# Patient Record
Sex: Female | Born: 1935 | Race: White | Hispanic: No | Smoking: Never smoker
Health system: Southern US, Community
[De-identification: ages and names within clinical notes are randomized; demographics above are authoritative.]

## PROBLEM LIST (undated history)

## (undated) DIAGNOSIS — K219 Gastro-esophageal reflux disease without esophagitis: Secondary | ICD-10-CM

## (undated) DIAGNOSIS — F329 Major depressive disorder, single episode, unspecified: Secondary | ICD-10-CM

## (undated) DIAGNOSIS — F32A Depression, unspecified: Secondary | ICD-10-CM

## (undated) DIAGNOSIS — Z809 Family history of malignant neoplasm, unspecified: Secondary | ICD-10-CM

## (undated) DIAGNOSIS — C541 Malignant neoplasm of endometrium: Secondary | ICD-10-CM

## (undated) DIAGNOSIS — B029 Zoster without complications: Secondary | ICD-10-CM

## (undated) DIAGNOSIS — Z1379 Encounter for other screening for genetic and chromosomal anomalies: Secondary | ICD-10-CM

## (undated) DIAGNOSIS — M1990 Unspecified osteoarthritis, unspecified site: Secondary | ICD-10-CM

## (undated) HISTORY — PX: BREAST EXCISIONAL BIOPSY: SUR124

## (undated) HISTORY — DX: Major depressive disorder, single episode, unspecified: F32.9

## (undated) HISTORY — DX: Depression, unspecified: F32.A

## (undated) HISTORY — PX: CERVICAL SPINE SURGERY: SHX589

## (undated) HISTORY — DX: Encounter for other screening for genetic and chromosomal anomalies: Z13.79

## (undated) HISTORY — DX: Unspecified osteoarthritis, unspecified site: M19.90

## (undated) HISTORY — PX: BREAST SURGERY: SHX581

## (undated) HISTORY — DX: Zoster without complications: B02.9

## (undated) HISTORY — DX: Gastro-esophageal reflux disease without esophagitis: K21.9

## (undated) HISTORY — DX: Family history of malignant neoplasm, unspecified: Z80.9

---

## 1998-01-06 ENCOUNTER — Encounter: Admission: RE | Admit: 1998-01-06 | Discharge: 1998-01-06 | Payer: Self-pay | Admitting: *Deleted

## 1999-04-30 ENCOUNTER — Other Ambulatory Visit: Admission: RE | Admit: 1999-04-30 | Discharge: 1999-04-30 | Payer: Self-pay | Admitting: Family Medicine

## 1999-05-15 ENCOUNTER — Encounter: Payer: Self-pay | Admitting: Family Medicine

## 1999-05-15 ENCOUNTER — Encounter: Admission: RE | Admit: 1999-05-15 | Discharge: 1999-05-15 | Payer: Self-pay | Admitting: Family Medicine

## 2000-08-26 ENCOUNTER — Encounter: Admission: RE | Admit: 2000-08-26 | Discharge: 2000-08-26 | Payer: Self-pay | Admitting: Family Medicine

## 2000-08-26 ENCOUNTER — Encounter: Payer: Self-pay | Admitting: Family Medicine

## 2000-10-19 ENCOUNTER — Other Ambulatory Visit: Admission: RE | Admit: 2000-10-19 | Discharge: 2000-10-19 | Payer: Self-pay | Admitting: Family Medicine

## 2001-09-26 ENCOUNTER — Encounter: Payer: Self-pay | Admitting: Family Medicine

## 2001-09-26 ENCOUNTER — Encounter: Admission: RE | Admit: 2001-09-26 | Discharge: 2001-09-26 | Payer: Self-pay | Admitting: Family Medicine

## 2001-10-23 ENCOUNTER — Other Ambulatory Visit: Admission: RE | Admit: 2001-10-23 | Discharge: 2001-10-23 | Payer: Self-pay | Admitting: Family Medicine

## 2002-10-22 ENCOUNTER — Encounter: Payer: Self-pay | Admitting: Family Medicine

## 2002-10-22 ENCOUNTER — Encounter: Admission: RE | Admit: 2002-10-22 | Discharge: 2002-10-22 | Payer: Self-pay | Admitting: Family Medicine

## 2002-12-19 ENCOUNTER — Encounter: Payer: Self-pay | Admitting: Family Medicine

## 2002-12-19 ENCOUNTER — Encounter: Admission: RE | Admit: 2002-12-19 | Discharge: 2002-12-19 | Payer: Self-pay | Admitting: Family Medicine

## 2003-04-27 HISTORY — PX: LAMINECTOMY: SHX219

## 2003-10-25 ENCOUNTER — Encounter: Admission: RE | Admit: 2003-10-25 | Discharge: 2003-10-25 | Payer: Self-pay | Admitting: Family Medicine

## 2004-03-17 ENCOUNTER — Inpatient Hospital Stay (HOSPITAL_COMMUNITY): Admission: RE | Admit: 2004-03-17 | Discharge: 2004-03-18 | Payer: Self-pay | Admitting: Specialist

## 2004-11-16 ENCOUNTER — Other Ambulatory Visit: Admission: RE | Admit: 2004-11-16 | Discharge: 2004-11-16 | Payer: Self-pay | Admitting: Family Medicine

## 2004-11-16 ENCOUNTER — Ambulatory Visit: Payer: Self-pay | Admitting: Family Medicine

## 2004-11-25 ENCOUNTER — Encounter: Admission: RE | Admit: 2004-11-25 | Discharge: 2004-11-25 | Payer: Self-pay | Admitting: Family Medicine

## 2004-11-26 ENCOUNTER — Ambulatory Visit: Payer: Self-pay | Admitting: Family Medicine

## 2005-12-15 ENCOUNTER — Encounter: Admission: RE | Admit: 2005-12-15 | Discharge: 2005-12-15 | Payer: Self-pay | Admitting: Family Medicine

## 2006-01-04 ENCOUNTER — Ambulatory Visit: Payer: Self-pay | Admitting: Family Medicine

## 2006-01-21 LAB — FECAL OCCULT BLOOD, GUAIAC: Fecal Occult Blood: NEGATIVE

## 2006-01-26 ENCOUNTER — Ambulatory Visit: Payer: Self-pay | Admitting: Family Medicine

## 2007-01-05 ENCOUNTER — Encounter: Admission: RE | Admit: 2007-01-05 | Discharge: 2007-01-05 | Payer: Self-pay | Admitting: Family Medicine

## 2007-01-09 ENCOUNTER — Encounter (INDEPENDENT_AMBULATORY_CARE_PROVIDER_SITE_OTHER): Payer: Self-pay | Admitting: *Deleted

## 2007-02-08 ENCOUNTER — Encounter: Payer: Self-pay | Admitting: Family Medicine

## 2007-02-08 DIAGNOSIS — Z8659 Personal history of other mental and behavioral disorders: Secondary | ICD-10-CM

## 2007-02-08 DIAGNOSIS — K589 Irritable bowel syndrome without diarrhea: Secondary | ICD-10-CM | POA: Insufficient documentation

## 2007-02-08 DIAGNOSIS — H9319 Tinnitus, unspecified ear: Secondary | ICD-10-CM | POA: Insufficient documentation

## 2007-02-08 DIAGNOSIS — K219 Gastro-esophageal reflux disease without esophagitis: Secondary | ICD-10-CM

## 2007-02-08 DIAGNOSIS — M1990 Unspecified osteoarthritis, unspecified site: Secondary | ICD-10-CM | POA: Insufficient documentation

## 2007-03-14 ENCOUNTER — Ambulatory Visit: Payer: Self-pay | Admitting: Family Medicine

## 2007-03-14 DIAGNOSIS — E782 Mixed hyperlipidemia: Secondary | ICD-10-CM

## 2007-03-14 DIAGNOSIS — N3941 Urge incontinence: Secondary | ICD-10-CM

## 2007-03-15 ENCOUNTER — Encounter (INDEPENDENT_AMBULATORY_CARE_PROVIDER_SITE_OTHER): Payer: Self-pay | Admitting: *Deleted

## 2007-03-15 LAB — CONVERTED CEMR LAB
ALT: 18 units/L (ref 0–35)
Albumin: 4 g/dL (ref 3.5–5.2)
Chloride: 101 meq/L (ref 96–112)
Creatinine, Ser: 0.9 mg/dL (ref 0.4–1.2)
Direct LDL: 208.9 mg/dL
GFR calc non Af Amer: 66 mL/min
HDL: 45.6 mg/dL (ref >39.0)
Sodium: 140 meq/L (ref 135–145)

## 2007-03-30 ENCOUNTER — Ambulatory Visit: Payer: Self-pay | Admitting: Internal Medicine

## 2007-04-13 ENCOUNTER — Encounter: Payer: Self-pay | Admitting: Family Medicine

## 2007-04-13 ENCOUNTER — Ambulatory Visit: Payer: Self-pay | Admitting: Internal Medicine

## 2007-05-01 ENCOUNTER — Ambulatory Visit: Payer: Self-pay | Admitting: Family Medicine

## 2007-05-02 LAB — CONVERTED CEMR LAB
AST: 37 units/L (ref 0–37)
Cholesterol: 191 mg/dL (ref 0–200)

## 2007-11-08 ENCOUNTER — Ambulatory Visit: Payer: Self-pay | Admitting: Family Medicine

## 2007-11-14 LAB — CONVERTED CEMR LAB
Cholesterol: 222 mg/dL (ref 0–200)
HDL: 45 mg/dL (ref >39.0)
Triglycerides: 94 mg/dL (ref 0–149)
VLDL: 19 mg/dL (ref 0–40)

## 2007-11-16 ENCOUNTER — Encounter: Payer: Self-pay | Admitting: Family Medicine

## 2007-11-16 ENCOUNTER — Telehealth (INDEPENDENT_AMBULATORY_CARE_PROVIDER_SITE_OTHER): Payer: Self-pay | Admitting: *Deleted

## 2007-12-26 ENCOUNTER — Ambulatory Visit: Payer: Self-pay | Admitting: Family Medicine

## 2007-12-27 LAB — CONVERTED CEMR LAB
AST: 21 units/L (ref 0–37)
HDL: 32.3 mg/dL — ABNORMAL LOW (ref >39.0)
Total CHOL/HDL Ratio: 5
VLDL: 22 mg/dL (ref 0–40)

## 2008-01-05 ENCOUNTER — Other Ambulatory Visit: Admission: RE | Admit: 2008-01-05 | Discharge: 2008-01-05 | Payer: Self-pay | Admitting: Family Medicine

## 2008-01-05 ENCOUNTER — Encounter: Payer: Self-pay | Admitting: Family Medicine

## 2008-01-05 ENCOUNTER — Ambulatory Visit: Payer: Self-pay | Admitting: Family Medicine

## 2008-01-09 ENCOUNTER — Encounter: Payer: Self-pay | Admitting: Family Medicine

## 2008-01-23 ENCOUNTER — Encounter: Admission: RE | Admit: 2008-01-23 | Discharge: 2008-01-23 | Payer: Self-pay | Admitting: Family Medicine

## 2008-04-10 ENCOUNTER — Telehealth: Payer: Self-pay | Admitting: Family Medicine

## 2008-11-28 ENCOUNTER — Ambulatory Visit: Payer: Self-pay | Admitting: Family Medicine

## 2008-11-29 ENCOUNTER — Encounter: Admission: RE | Admit: 2008-11-29 | Discharge: 2008-11-29 | Payer: Self-pay | Admitting: Family Medicine

## 2008-11-29 ENCOUNTER — Encounter: Payer: Self-pay | Admitting: Family Medicine

## 2008-12-02 ENCOUNTER — Encounter: Payer: Self-pay | Admitting: Family Medicine

## 2008-12-02 LAB — CONVERTED CEMR LAB
ALT: 15 units/L (ref 0–35)
BUN: 15 mg/dL (ref 6–23)
Basophils Absolute: 0.1 10*3/uL (ref 0.0–0.1)
Chloride: 105 meq/L (ref 96–112)
Direct LDL: 176.1 mg/dL
Eosinophils Relative: 7 % — ABNORMAL HIGH (ref 0.0–5.0)
Glucose, Bld: 96 mg/dL (ref 70–99)
MCV: 92.4 fL (ref 78.0–100.0)
Monocytes Absolute: 0.6 10*3/uL (ref 0.1–1.0)
Neutrophils Relative %: 51.3 % (ref 43.0–77.0)
Phosphorus: 3.8 mg/dL (ref 2.3–4.6)
Platelets: 197 10*3/uL (ref 150.0–400.0)
Potassium: 4.5 meq/L (ref 3.5–5.1)
RDW: 12.5 % (ref 11.5–14.6)
TSH: 2.88 microintl units/mL (ref 0.35–5.50)
Total CHOL/HDL Ratio: 6
VLDL: 15.6 mg/dL (ref 0.0–40.0)
WBC: 6.5 10*3/uL (ref 4.5–10.5)

## 2009-01-14 ENCOUNTER — Ambulatory Visit: Payer: Self-pay | Admitting: Family Medicine

## 2009-01-15 LAB — CONVERTED CEMR LAB
ALT: 17 units/L (ref 0–35)
AST: 23 units/L (ref 0–37)
Cholesterol: 174 mg/dL (ref 0–200)
VLDL: 19.2 mg/dL (ref 0.0–40.0)

## 2009-01-23 ENCOUNTER — Encounter: Admission: RE | Admit: 2009-01-23 | Discharge: 2009-01-23 | Payer: Self-pay | Admitting: Family Medicine

## 2009-01-24 ENCOUNTER — Encounter (INDEPENDENT_AMBULATORY_CARE_PROVIDER_SITE_OTHER): Payer: Self-pay | Admitting: *Deleted

## 2009-04-28 ENCOUNTER — Telehealth: Payer: Self-pay | Admitting: Family Medicine

## 2009-05-21 ENCOUNTER — Ambulatory Visit: Payer: Self-pay | Admitting: Family Medicine

## 2009-07-21 ENCOUNTER — Ambulatory Visit: Payer: Self-pay | Admitting: Family Medicine

## 2009-07-22 ENCOUNTER — Telehealth: Payer: Self-pay | Admitting: Family Medicine

## 2009-07-22 LAB — CONVERTED CEMR LAB
Cholesterol: 215 mg/dL — ABNORMAL HIGH (ref 0–200)
HDL: 57.7 mg/dL (ref >39.00)
Triglycerides: 97 mg/dL (ref 0.0–149.0)
VLDL: 19.4 mg/dL (ref 0.0–40.0)

## 2009-09-23 ENCOUNTER — Ambulatory Visit: Payer: Self-pay | Admitting: Family Medicine

## 2009-09-24 LAB — CONVERTED CEMR LAB
ALT: 15 units/L (ref 0–35)
HDL: 51.4 mg/dL (ref >39.00)
LDL Cholesterol: 80 mg/dL (ref 0–99)
Total CHOL/HDL Ratio: 3
Triglycerides: 72 mg/dL (ref 0.0–149.0)
VLDL: 14.4 mg/dL (ref 0.0–40.0)

## 2009-10-24 DIAGNOSIS — B029 Zoster without complications: Secondary | ICD-10-CM

## 2009-10-24 HISTORY — DX: Zoster without complications: B02.9

## 2009-12-09 ENCOUNTER — Telehealth (INDEPENDENT_AMBULATORY_CARE_PROVIDER_SITE_OTHER): Payer: Self-pay | Admitting: *Deleted

## 2009-12-10 ENCOUNTER — Ambulatory Visit: Payer: Self-pay | Admitting: Family Medicine

## 2009-12-11 LAB — CONVERTED CEMR LAB
ALT: 11 units/L (ref 0–35)
AST: 18 units/L (ref 0–37)
Basophils Absolute: 0.1 10*3/uL (ref 0.0–0.1)
Basophils Relative: 1 % (ref 0.0–3.0)
CO2: 31 meq/L (ref 19–32)
Calcium: 9.4 mg/dL (ref 8.4–10.5)
Creatinine, Ser: 0.8 mg/dL (ref 0.4–1.2)
Eosinophils Absolute: 0.5 10*3/uL (ref 0.0–0.7)
GFR calc non Af Amer: 72.33 mL/min (ref >60)
Hemoglobin: 14 g/dL (ref 12.0–15.0)
Lymphocytes Relative: 36.9 % (ref 12.0–46.0)
MCHC: 33.6 g/dL (ref 30.0–36.0)
Monocytes Relative: 8.3 % (ref 3.0–12.0)
Neutro Abs: 2.9 10*3/uL (ref 1.4–7.7)
Neutrophils Relative %: 45.7 % (ref 43.0–77.0)
Potassium: 4.5 meq/L (ref 3.5–5.1)
RBC: 4.51 M/uL (ref 3.87–5.11)
RDW: 13.8 % (ref 11.5–14.6)
Sodium: 144 meq/L (ref 135–145)
Total CHOL/HDL Ratio: 5

## 2009-12-15 ENCOUNTER — Ambulatory Visit: Payer: Self-pay | Admitting: Family Medicine

## 2009-12-15 DIAGNOSIS — B029 Zoster without complications: Secondary | ICD-10-CM | POA: Insufficient documentation

## 2010-01-26 ENCOUNTER — Encounter: Admission: RE | Admit: 2010-01-26 | Discharge: 2010-01-26 | Payer: Self-pay | Admitting: Family Medicine

## 2010-01-26 LAB — HM MAMMOGRAPHY: HM Mammogram: NORMAL

## 2010-01-29 ENCOUNTER — Encounter: Payer: Self-pay | Admitting: Family Medicine

## 2010-03-17 ENCOUNTER — Ambulatory Visit: Payer: Self-pay | Admitting: Family Medicine

## 2010-03-17 LAB — CONVERTED CEMR LAB
Cholesterol: 196 mg/dL (ref 0–200)
HDL: 57.6 mg/dL (ref >39.00)
Triglycerides: 105 mg/dL (ref 0.0–149.0)

## 2010-05-28 NOTE — Progress Notes (Signed)
Summary: regarding zocor  Phone Note Call from Patient   Caller: Patient Call For: Judith Part MD Summary of Call: Advised pt of lab results, she said she has not been taking zocor regularly, says she takes it 2-3 times a week.  She said she would start back on it and will take it regularly. Initial call taken by: Lowella Petties CMA,  July 22, 2009 1:03 PM  Follow-up for Phone Call        ok- get on it regularly sched fasting lab 2 mo lipid/ast/alt 272 Follow-up by: Judith Part MD,  July 22, 2009 1:30 PM  Additional Follow-up for Phone Call Additional follow up Details #1::        Left message for patient to call back. Lewanda Rife LPN  July 22, 2009 2:44 PM   Patient notified as instructed by telephone. Fasting lab appointment scheduled as instructed 09/23/09 at 8:30am.[facility] LPN  July 22, 2009 5:10 PM

## 2010-05-28 NOTE — Progress Notes (Signed)
Summary: pt wants 90 day supply of paxil  Phone Note Refill Request Call back at Home Phone [phone] Message from:  Patient  Refills Requested: Medication #1:  PAXIL 20 MG TABS 1 by mouth once daily Pt is requesting that a 90 day supply be sent to [facility] [address], script was written for a 30 day supply.  Initial call taken by: Lowella Petties CMA,  April 28, 2009 3:18 PM  Follow-up for Phone Call        px written on EMR for call in  Follow-up by: Judith Part MD,  April 28, 2009 4:40 PM  Additional Follow-up for Phone Call Additional follow up Details #1::        Sent electronically. Additional Follow-up by: Delilah Shan CMA (AAMA),  April 29, 2009 9:16 AM    Prescriptions: PAXIL 20 MG TABS (PAROXETINE HCL) 1 by mouth once daily  #90 x 3   Entered by:   Delilah Shan CMA (AAMA)   Authorized by:   Judith Part MD   Signed by:   Delilah Shan CMA Duncan Dull) on 04/29/2009   Method used:   Electronically to        [facility] [phone]* (retail)       [address]       [place], [place]  [address]       Ph: [phone]       Fax: [phone]   RxID:   [id number] PAXIL 20 MG TABS (PAROXETINE HCL) 1 by mouth once daily  #90 x 3   Entered and Authorized by:   Judith Part MD   Signed by:   Judith Part MD on 04/28/2009   Method used:   Telephoned to ...         RxID:   [id number]
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
## 2010-05-28 NOTE — Letter (Signed)
Summary: Results Follow up Letter  [place] [facility]  [address] [place], [place] [address]   Phone: [phone]  Fax: [phone]    01/29/2010 MRN: [id number]    [facility] [address] [place], [place]  [address]    Dear Ms. Biebel,  The following are the results of your recent test(s):  Test         Result    Pap Smear:        Normal _____  Not Normal _____ Comments: ______________________________________________________ Cholesterol: LDL(Bad cholesterol):         Your goal is less than:         HDL (Good cholesterol):       Your goal is more than: Comments:  ______________________________________________________ Mammogram:        Normal __X___  Not Normal _____ Comments:Repeat in one year.   ___________________________________________________________________ Hemoccult:        Normal _____  Not normal _______ Comments:    _____________________________________________________________________ Other Tests:    We routinely do not discuss normal results over the telephone.  If you desire a copy of the results, or you have any questions about this information we can discuss them at your next office visit.   Sincerely,    Idamae Schuller Tamarick Kovalcik,MD  MT/ri

## 2010-05-28 NOTE — Progress Notes (Signed)
----   Converted from flag ---- ---- 12/09/2009 8:06 AM, Colon Flattery Tower MD wrote: please check lipid/ renal /ast/alt/ cbc with diff for 272 and GERD  ---- 12/09/2009 7:35 AM, Liane Comber CMA (AAMA) wrote: [facility]! This pt is scheduled for cpx labs tomorrow, which labs to draw and dx codes to use? Thanks Tasha ------------------------------

## 2010-09-08 ENCOUNTER — Other Ambulatory Visit: Payer: Self-pay | Admitting: Family Medicine

## 2010-09-08 DIAGNOSIS — E7800 Pure hypercholesterolemia, unspecified: Secondary | ICD-10-CM

## 2010-09-09 ENCOUNTER — Other Ambulatory Visit (INDEPENDENT_AMBULATORY_CARE_PROVIDER_SITE_OTHER): Payer: Medicare Other | Admitting: Family Medicine

## 2010-09-09 DIAGNOSIS — E7800 Pure hypercholesterolemia, unspecified: Secondary | ICD-10-CM

## 2010-09-09 LAB — LIPID PANEL
Cholesterol: 259 mg/dL — ABNORMAL HIGH (ref 0–200)
Total CHOL/HDL Ratio: 5
Triglycerides: 117 mg/dL (ref 0.0–149.0)
VLDL: 23.4 mg/dL (ref 0.0–40.0)

## 2010-09-09 LAB — AST: AST: 20 U/L (ref 0–37)

## 2010-09-11 NOTE — Op Note (Signed)
NAMECHASTELYN, ATHENS              ACCOUNT NO.:  [id number]   MEDICAL RECORD NO.:  [id number]          PATIENT TYPE:  INP   LOCATION:  2550                         FACILITY:  [facility]   PHYSICIAN:  Kerrin Champagne, M.D.   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   OPERATION PERFORMED:  Left L4 hemilaminectomy with decompression of both the  left L4 and L5 nerve roots.   SURGEON:  Kerrin Champagne, M.D.   ASSISTANT:  Wende Neighbors, P.A.   ANESTHESIA:  GOT.   ANESTHESIOLOGIST:  Kaylyn Layer. Michelle Piper, M.D.   Intubation using an oral obturator.   ESTIMATED BLOOD LOSS:  50 mL.   DRAINS:  None.   INDICATIONS FOR PROCEDURE:  The patient is a 77 year old female who has been  experiencing low back pain for years.  Over the last half year, has  developed increasing discomfort and left arm radiation down to anterior calf  to her left great toe.  She was able to walk.  Discomfort was worsened with  sweeping, walking and vacuuming.  She has a history of left-sided protruded  disk by MRI.  She was found to have eccentric disk bulge with associated  arthrosis changes causing left L4-5 lateral recess stenosis affecting the L5  nerve root and entrapment of the L4 nerve roots within the neural foramen  secondary to the disk degeneration and arthrosis.  She was brought to the  operating room to undergo a left-sided L4-5 hemilaminectomy with lateral  recess decompression and foraminal decompression to be carried out over the  left-sided L4 and L5 nerve roots.   INTRAOPERATIVE FINDINGS:  The patient was found to have hypertrophic  ligamentum flavum associated with the left  L4-5 facet impressing on the left  side of the thecal sac compressing the left L5 nerve root within the lateral  recess, foraminal entrapment of the L5 nerve root also found to be present  as well as L4 nerve root compression secondary to the hypertrophic flavum  involving the reflected portion of the ligamentum flavum and the superior  articular process of L5.  The patient also had decompression of the lateral  recess of the upper segment L3-4.   DESCRIPTION OF PROCEDURE:  After adequate general anesthesia the patient  transferred to the [facility] spine table, knee chest position, Andrews frame,  standard preoperative antibiotics, standard prep with DuraPrep solution,  draped in the usual manner.  A spinal needle was placed.  Intraoperative  lateral radiograph demonstrated the needles above and below the expected L4  spinous process.  Incision made over this area following infiltration with  Marcaine 0.5% with 1:200,000 epinephrine about 5 mL to 10 mL.  An iodine Vi-  drape was used.  Incision was carried sharply through skin and subcutaneous  layers down to the lumbodorsal fascia.  This was incised on both sides of  the spinous process of L3 and L4.  Intraoperative lateral radiograph  obtained with a clamp over the spinous process of L3 and L4 demonstrating  these to be the said mentioned spinous process.  Skin marked in these areas  and a Cobb used to elevate the paralumbar muscles on the left side at the  expected L3 and L4 levels down to the L4-5 interlaminar region.  McCullough  retractor was inserted after removal of a small portion of the left lateral  aspect of the superficial portion of the spinous process to allow for  further exposure of the posterior elements here.  After placing the Boss-  McCullough retractor, visualization was quite nice within the left  interlaminar region of L4-5 extending out to L3-4.  A high speed bur was  then used to carefully thin the left side of the  lamina of L4, removing  about 20% of the medial aspect of the inferior articular process of L4 and  the process.  A half inch osteotome was used to further osteotomize the  deeper portions of the inferior articular process of L4 to allow for further  entry into the interlaminar region which had closed down due to degenerative  disk changes. A 3 mm Kerrison was then able to be introduced.  This was then  used to perform the left-sided hemilaminectomy removing the bone and  preserving the approximately 3 or 4 mm of the pars regions on the left side  at L4.  This was then carried up to the superior margin of L4 in the lamina  on the left side and this was also then removed and the ligamentum flavum at  the L3-4 level was then excised and decompression carried out over the left  L3-4 lateral recess decompressing the 4 nerve root at its entry point into  the neural foramen.  The operating room microscope was then draped and  brought into the field.  Prior to that, loupe magnification and head lamp  was used for the initial entry into the spinal canal.  Under the operating  room microscope then, with excellent visualization then the ligamentum  flavum was excised along the left side at the L4-5 level and partial medial  facetectomy carried out over the superior tip of the process of L5  decompressing the lateral recess out to the level of the medial aspect of  the pedicle of L5 performing foraminotomy over the left L5 nerve root using  3 mm and 2 mm Kerrisons.  With resection of this, the canal was examined.  A  hockey stick nerve probe was able to be passed out the L5 neural foramen  demonstrating decompression of the 5 nerve root.  It was able to be passed  out beneath or ventral to the facet at the L4-5 level demonstrating  decompression of the left L4 nerve root beneath the L4-5 facet on the left  side.  Continuing up the left lateral recess at L4, appeared to be well decompressed as well.   Irrigation was performed.  Bone wax was applied to  the bleeding cancellous bone surface along the area of the hemilaminectomy.  Excess bone wax was removed.  Irrigation was performed.  A small portion of  Gelfoam was then placed over the left side of the hemilaminectomy area.  Soft tissue was allowed to fall back into place.  The incision was then  closed by approximating  the lumbodorsal fascia in the midline with  interrupted #1 Vicryl sutures, deep subcutaneous layers with interrupted #1-  0 Vicryl sutures, more superficial layers with interrupted 2-0 Vicryl  sutures and the skin closed with a running subcutaneous stitch of 4-0  Vicryl.  Tincture of benzoin and Steri-Strips applied.  4 x 4s, ABD pad  fixed to the skin with HypaFix tape.  The patient then returned to a supine  position, reactivated extubated and returned to the recovery room in  satisfactory.  All instrument and sponge counts were correct.      Jame   JEN/MEDQ  D:  03/17/2004  T:  03/17/2004  Job:  [id number]

## 2010-09-21 ENCOUNTER — Other Ambulatory Visit: Payer: Self-pay | Admitting: Family Medicine

## 2010-09-23 NOTE — Telephone Encounter (Signed)
Medication already sent electronically.

## 2010-09-23 NOTE — Telephone Encounter (Signed)
Pt needs to call for appt. 

## 2010-12-31 ENCOUNTER — Other Ambulatory Visit: Payer: Self-pay | Admitting: Family Medicine

## 2010-12-31 NOTE — Telephone Encounter (Signed)
Will refill x 1, but pt needs f/u appt with PCP prior to more refills.

## 2010-12-31 NOTE — Telephone Encounter (Signed)
[facility] on [address] electronically request refill on Paroxetine 20 mg. Pt last refill of this med was 09/21/10 #90 with 0 refills. Pt had CPX on 12/15/09 with Dr Milinda Antis and pt does not have any future appts scheduled. Please advise.

## 2011-01-20 ENCOUNTER — Other Ambulatory Visit: Payer: Self-pay | Admitting: Family Medicine

## 2011-01-20 DIAGNOSIS — Z1231 Encounter for screening mammogram for malignant neoplasm of breast: Secondary | ICD-10-CM

## 2011-01-28 ENCOUNTER — Ambulatory Visit
Admission: RE | Admit: 2011-01-28 | Discharge: 2011-01-28 | Disposition: A | Discharge status: Home / Self Care | Payer: Medicare Other | Source: Ambulatory Visit | Attending: Family Medicine | Admitting: Family Medicine

## 2011-01-28 DIAGNOSIS — Z1231 Encounter for screening mammogram for malignant neoplasm of breast: Secondary | ICD-10-CM

## 2011-02-01 ENCOUNTER — Encounter: Payer: Self-pay | Admitting: *Deleted

## 2011-05-07 ENCOUNTER — Other Ambulatory Visit: Payer: Self-pay | Admitting: Family Medicine

## 2011-05-07 NOTE — Telephone Encounter (Signed)
If she has not been seen in over a year - please make appt and give enough refils until that visit thanks

## 2011-05-07 NOTE — Telephone Encounter (Signed)
OK to refill? Last OV 01/26/10

## 2011-05-10 ENCOUNTER — Other Ambulatory Visit: Payer: Self-pay | Admitting: *Deleted

## 2011-05-10 MED ORDER — PAROXETINE HCL 20 MG PO TABS: 20.0000 mg | ORAL_TABLET | ORAL | Status: DC

## 2011-05-10 NOTE — Telephone Encounter (Signed)
Will refil once -- please schedule follow up  No further refils without f/u after that

## 2011-05-10 NOTE — Telephone Encounter (Signed)
OK to refill? Last OV 01/26/10 

## 2011-05-11 NOTE — Telephone Encounter (Signed)
Spoke with pt and scheduled a fu appt

## 2011-05-11 NOTE — Telephone Encounter (Signed)
Message left for patient to call and schedule an appt. Meds not called in yet. Awaiting return call from patient.

## 2011-05-21 ENCOUNTER — Encounter: Payer: Self-pay | Admitting: Family Medicine

## 2011-05-21 ENCOUNTER — Ambulatory Visit (INDEPENDENT_AMBULATORY_CARE_PROVIDER_SITE_OTHER): Payer: Medicare Other | Admitting: Family Medicine

## 2011-05-21 VITALS — BP 140/60 | HR 72 | Temp 98.1°F | Ht 65.0 in | Wt 187.5 lb

## 2011-05-21 DIAGNOSIS — F329 Major depressive disorder, single episode, unspecified: Secondary | ICD-10-CM

## 2011-05-21 DIAGNOSIS — E669 Obesity, unspecified: Secondary | ICD-10-CM | POA: Insufficient documentation

## 2011-05-21 DIAGNOSIS — Z23 Encounter for immunization: Secondary | ICD-10-CM

## 2011-05-21 DIAGNOSIS — R339 Retention of urine, unspecified: Secondary | ICD-10-CM

## 2011-05-21 DIAGNOSIS — E782 Mixed hyperlipidemia: Secondary | ICD-10-CM

## 2011-05-21 LAB — COMPREHENSIVE METABOLIC PANEL
ALT: 15 U/L (ref 0–35)
CO2: 31 mEq/L (ref 19–32)
Calcium: 9.3 mg/dL (ref 8.4–10.5)
Chloride: 105 mEq/L (ref 96–112)
Creatinine, Ser: 1 mg/dL (ref 0.4–1.2)
GFR: 60.8 mL/min (ref >60.00)
Sodium: 141 mEq/L (ref 135–145)
Total Protein: 7.3 g/dL (ref 6.0–8.3)

## 2011-05-21 NOTE — Assessment & Plan Note (Addendum)
Doing fine with her depressoin- paxil still works well and she wants to continue that  Also helps with anxiety

## 2011-05-21 NOTE — Assessment & Plan Note (Signed)
Is not very compliant with her simvastatin Disc importance of this and risks of high chol Also diet - not optimal- no motivation to change but enc to stop ice cream Rev low sat fat diet  Lab today
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
## 2011-05-21 NOTE — Patient Instructions (Addendum)
Please work on getting more exercise and also avoiding saturated fats ( ice cream)  Avoid red meat/ fried foods/ egg yolks/ fatty breakfast meats/ butter, cheese and high fat dairy/ and shellfish  ---these are foods to avoid with cholesterol  Take your simvastatin EVERY DAY If urinary retention persists or worsens let me know -- ? If your sinus medicine worsens it (keep me updated)  Labs today  Flu shot today Follow up in 6 months

## 2011-05-23 NOTE — Assessment & Plan Note (Signed)
Discussed how this problem influences overall health and the risks it imposes  Reviewed plan for weight loss with lower calorie diet (via better food choices and also portion control or program like weight watchers) and exercise building up to or more than 30 minutes 5 days per week including some aerobic activity    

## 2011-05-23 NOTE — Assessment & Plan Note (Signed)
New symptom in pt with hx of urethral polyp in past Re assuring exam today with nl appearing urethra and bimanual exam Upon further questioning - she does take occ "sinus pill"- this could be the cause She will stop this and update me - consider urol ref if not imp

## 2011-07-25 ENCOUNTER — Other Ambulatory Visit: Payer: Self-pay | Admitting: Family Medicine

## 2011-07-26 NOTE — Telephone Encounter (Signed)
Will refill electronically  

## 2011-11-08 ENCOUNTER — Other Ambulatory Visit: Payer: Self-pay

## 2011-11-08 MED ORDER — PAROXETINE HCL 20 MG PO TABS: 20.0000 mg | ORAL_TABLET | Freq: Every morning | ORAL | Status: DC

## 2011-11-08 NOTE — Telephone Encounter (Signed)
Pt request 3 month rx for Paroxetine to [facility]. Pt notified sent while on phone.

## 2011-12-28 ENCOUNTER — Other Ambulatory Visit: Payer: Self-pay | Admitting: Family Medicine

## 2011-12-28 DIAGNOSIS — Z1231 Encounter for screening mammogram for malignant neoplasm of breast: Secondary | ICD-10-CM

## 2012-01-31 ENCOUNTER — Ambulatory Visit
Admission: RE | Admit: 2012-01-31 | Discharge: 2012-01-31 | Disposition: A | Discharge status: Home / Self Care | Payer: Medicare Other | Source: Ambulatory Visit | Attending: Family Medicine | Admitting: Family Medicine

## 2012-01-31 DIAGNOSIS — Z1231 Encounter for screening mammogram for malignant neoplasm of breast: Secondary | ICD-10-CM

## 2012-02-03 ENCOUNTER — Encounter: Payer: Self-pay | Admitting: *Deleted

## 2012-02-03 ENCOUNTER — Encounter: Payer: Self-pay | Admitting: Family Medicine

## 2012-07-11 ENCOUNTER — Encounter: Payer: Self-pay | Admitting: Family Medicine

## 2012-07-11 ENCOUNTER — Ambulatory Visit (INDEPENDENT_AMBULATORY_CARE_PROVIDER_SITE_OTHER): Payer: Medicare PPO | Admitting: Family Medicine

## 2012-07-11 VITALS — BP 168/70 | HR 70 | Temp 98.8°F | Ht 65.0 in

## 2012-07-11 DIAGNOSIS — E782 Mixed hyperlipidemia: Secondary | ICD-10-CM

## 2012-07-11 DIAGNOSIS — F430 Acute stress reaction: Secondary | ICD-10-CM

## 2012-07-11 DIAGNOSIS — I10 Essential (primary) hypertension: Secondary | ICD-10-CM

## 2012-07-11 MED ORDER — HYDROCHLOROTHIAZIDE 25 MG PO TABS: 25.0000 mg | ORAL_TABLET | Freq: Every day | ORAL | Status: DC

## 2012-07-11 MED ORDER — PAROXETINE HCL 20 MG PO TABS: 20.0000 mg | ORAL_TABLET | Freq: Every morning | ORAL | Status: DC

## 2012-07-11 NOTE — Assessment & Plan Note (Signed)
New dx  Disc lifestyle change needed and effects of stress and other factors  Lab today  If all ok -will start hctz 25 mg daily- aware this will inc urination Then f/u 2-3 wk visit and labs Did disc HTN/ what it is / why we treat it and given handouts

## 2012-07-11 NOTE — Patient Instructions (Addendum)
Labs today for new hypertension (high blood pressure)  Eat a healthy low salt diet and stay active If you want to see a counselor about stress in the future let me know  Hold the px for hctz (blood pressure medicine) - until we get your labs back and tell you to start it  Follow up with me in 2-3 weeks

## 2012-07-11 NOTE — Assessment & Plan Note (Signed)
Overall good insight-no obv signs of dep or anx Offered counseling if she wants it in the future

## 2012-07-11 NOTE — Assessment & Plan Note (Signed)
Lab today Goals changed in light of new HTN Will disc at f/u
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
## 2012-07-12 LAB — LIPID PANEL
HDL: 52.6 mg/dL (ref >39.00)
Total CHOL/HDL Ratio: 4
Triglycerides: 137 mg/dL (ref 0.0–149.0)

## 2012-07-12 LAB — CBC WITH DIFFERENTIAL/PLATELET
Basophils Absolute: 0.1 10*3/uL (ref 0.0–0.1)
Eosinophils Absolute: 0.4 10*3/uL (ref 0.0–0.7)
HCT: 42.7 % (ref 36.0–46.0)
Hemoglobin: 14.1 g/dL (ref 12.0–15.0)
Lymphs Abs: 2.5 10*3/uL (ref 0.7–4.0)
MCHC: 33 g/dL (ref 30.0–36.0)
Neutro Abs: 5.7 10*3/uL (ref 1.4–7.7)
RDW: 13.6 % (ref 11.5–14.6)

## 2012-07-12 LAB — COMPREHENSIVE METABOLIC PANEL
ALT: 17 U/L (ref 0–35)
AST: 26 U/L (ref 0–37)
Creatinine, Ser: 0.9 mg/dL (ref 0.4–1.2)
GFR: 65.35 mL/min (ref >60.00)
Total Bilirubin: 0.6 mg/dL (ref 0.3–1.2)

## 2012-07-12 LAB — LDL CHOLESTEROL, DIRECT: Direct LDL: 124.6 mg/dL

## 2012-07-25 ENCOUNTER — Ambulatory Visit (INDEPENDENT_AMBULATORY_CARE_PROVIDER_SITE_OTHER): Payer: Medicare PPO | Admitting: Family Medicine

## 2012-07-25 ENCOUNTER — Encounter: Payer: Self-pay | Admitting: Family Medicine

## 2012-07-25 VITALS — BP 138/70 | HR 63 | Temp 98.8°F | Ht 65.0 in | Wt 184.0 lb

## 2012-07-25 DIAGNOSIS — E782 Mixed hyperlipidemia: Secondary | ICD-10-CM

## 2012-07-25 DIAGNOSIS — I10 Essential (primary) hypertension: Secondary | ICD-10-CM

## 2012-07-25 NOTE — Assessment & Plan Note (Signed)
Disc goals for lipids and reasons to control them Rev labs with pt Rev low sat fat diet in detail On zocor and diet Good ratio- but would like LDL eventually under 100

## 2012-07-25 NOTE — Patient Instructions (Addendum)
Blood pressure is better today Take care of yourself  Get a new blood pressure cuff for home - I recommend OMRON cuff for arm  Watch out for sodium in diet  Stay active Let me know if any problems Avoid red meat/ fried foods/ egg yolks/ fatty breakfast meats/ butter, cheese and high fat dairy/ and shellfish

## 2012-07-25 NOTE — Assessment & Plan Note (Signed)
This is much improved with reduction in stress , more active lifestyle and better diet/ few lb off No med at this time Rev labs in detail  Will continue to follow Handout given on DASH diet also
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
## 2012-10-12 ENCOUNTER — Telehealth: Payer: Self-pay | Admitting: Family Medicine

## 2012-10-12 DIAGNOSIS — Z0000 Encounter for general adult medical examination without abnormal findings: Secondary | ICD-10-CM

## 2012-10-12 DIAGNOSIS — E782 Mixed hyperlipidemia: Secondary | ICD-10-CM

## 2012-10-12 NOTE — Telephone Encounter (Signed)
Message copied by Judy Pimple on Thu Oct 12, 2012  3:49 PM ------      Message from: Baldomero Lamy      Created: Fri Oct 06, 2012  8:47 AM      Regarding: Cpx Labs Tues 6/24       Please order  future cpx labs for pt's upcoming lab appt.      Thanks      Tasha       ------

## 2012-10-17 ENCOUNTER — Other Ambulatory Visit (INDEPENDENT_AMBULATORY_CARE_PROVIDER_SITE_OTHER): Payer: Medicare PPO

## 2012-10-17 DIAGNOSIS — Z0000 Encounter for general adult medical examination without abnormal findings: Secondary | ICD-10-CM

## 2012-10-17 DIAGNOSIS — E782 Mixed hyperlipidemia: Secondary | ICD-10-CM

## 2012-10-17 LAB — COMPREHENSIVE METABOLIC PANEL
AST: 19 U/L (ref 0–37)
Albumin: 3.8 g/dL (ref 3.5–5.2)
Alkaline Phosphatase: 38 U/L — ABNORMAL LOW (ref 39–117)
BUN: 12 mg/dL (ref 6–23)
Calcium: 9 mg/dL (ref 8.4–10.5)
Chloride: 106 mEq/L (ref 96–112)
Glucose, Bld: 94 mg/dL (ref 70–99)
Potassium: 4.5 mEq/L (ref 3.5–5.1)
Sodium: 143 mEq/L (ref 135–145)
Total Protein: 7.3 g/dL (ref 6.0–8.3)

## 2012-10-17 LAB — LIPID PANEL
Cholesterol: 167 mg/dL (ref 0–200)
LDL Cholesterol: 97 mg/dL (ref 0–99)
Total CHOL/HDL Ratio: 3
Triglycerides: 73 mg/dL (ref 0.0–149.0)

## 2012-10-17 LAB — CBC WITH DIFFERENTIAL/PLATELET
Basophils Relative: 1 % (ref 0.0–3.0)
Eosinophils Relative: 4.8 % (ref 0.0–5.0)
Lymphocytes Relative: 28.3 % (ref 12.0–46.0)
Monocytes Relative: 7.5 % (ref 3.0–12.0)
Neutrophils Relative %: 58.4 % (ref 43.0–77.0)
Platelets: 257 10*3/uL (ref 150.0–400.0)
RBC: 4.33 Mil/uL (ref 3.87–5.11)
WBC: 8.9 10*3/uL (ref 4.5–10.5)

## 2012-10-24 ENCOUNTER — Encounter: Payer: Self-pay | Admitting: Family Medicine

## 2012-10-24 ENCOUNTER — Ambulatory Visit (INDEPENDENT_AMBULATORY_CARE_PROVIDER_SITE_OTHER): Payer: Medicare PPO | Admitting: Family Medicine

## 2012-10-24 VITALS — BP 134/64 | HR 56 | Temp 98.7°F | Ht 65.25 in | Wt 188.5 lb

## 2012-10-24 DIAGNOSIS — Z0000 Encounter for general adult medical examination without abnormal findings: Secondary | ICD-10-CM

## 2012-10-24 DIAGNOSIS — F329 Major depressive disorder, single episode, unspecified: Secondary | ICD-10-CM

## 2012-10-24 DIAGNOSIS — Z23 Encounter for immunization: Secondary | ICD-10-CM

## 2012-10-24 DIAGNOSIS — E782 Mixed hyperlipidemia: Secondary | ICD-10-CM

## 2012-10-24 NOTE — Assessment & Plan Note (Signed)
Reviewed health habits including diet and exercise and skin cancer prevention Also reviewed health mt list, fam hx and immunizations  Labs rev See HPI

## 2012-10-24 NOTE — Assessment & Plan Note (Signed)
Disc goals for lipids and reasons to control them Rev labs with pt Rev low sat fat diet in detail   

## 2012-10-24 NOTE — Assessment & Plan Note (Signed)
Doing ok with paxil - overall improved and stable  Pt remains motivated and active

## 2012-10-24 NOTE — Patient Instructions (Addendum)
Pneumonia vaccine today If you are interested in a shingles/zoster vaccine - call your insurance to check on coverage,( you should not get it within 1 month of other vaccines) , then call us for a prescription  for it to take to a pharmacy that gives the shot , or make a nurse visit to get it here depending on your coverage Think about drawing up a living will/ designating a power of attorney Stay active- and keep a cane or walker with you to prevent falls
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
## 2012-10-25 ENCOUNTER — Other Ambulatory Visit: Payer: Self-pay | Admitting: *Deleted

## 2012-10-25 MED ORDER — SIMVASTATIN 40 MG PO TABS: ORAL_TABLET | ORAL | Status: DC

## 2013-01-08 ENCOUNTER — Other Ambulatory Visit: Payer: Self-pay

## 2013-01-08 DIAGNOSIS — Z1231 Encounter for screening mammogram for malignant neoplasm of breast: Secondary | ICD-10-CM

## 2013-02-01 ENCOUNTER — Ambulatory Visit
Admission: RE | Admit: 2013-02-01 | Discharge: 2013-02-01 | Disposition: A | Discharge status: Home / Self Care | Payer: Medicare PPO | Source: Ambulatory Visit

## 2013-02-01 DIAGNOSIS — Z1231 Encounter for screening mammogram for malignant neoplasm of breast: Secondary | ICD-10-CM

## 2013-02-02 ENCOUNTER — Encounter: Payer: Self-pay | Admitting: *Deleted

## 2013-04-24 ENCOUNTER — Telehealth: Payer: Self-pay | Admitting: Family Medicine

## 2013-04-24 DIAGNOSIS — M549 Dorsalgia, unspecified: Secondary | ICD-10-CM

## 2013-04-24 NOTE — Telephone Encounter (Signed)
Pt came by and dropped off papers to get Authorization for Lumbar Epidural injection. Pt put fax number on piece of paper and I will put the papers with [place].

## 2013-04-24 NOTE — Telephone Encounter (Signed)
I assume she means referral (not medical clearance) - but confirm that with her and I will do so

## 2013-04-24 NOTE — Telephone Encounter (Signed)
Form placed in your inbox

## 2013-05-01 DIAGNOSIS — M549 Dorsalgia, unspecified: Secondary | ICD-10-CM | POA: Insufficient documentation

## 2013-05-01 NOTE — Telephone Encounter (Signed)
Spoke with patient.  She says she thinks they are asking for medical clearance to do the injection so that insurance will pay for it.

## 2013-05-01 NOTE — Telephone Encounter (Signed)
I am confused - medical clearance would be for safety and is asked for by the doctor A referral is for insurance  Could you please call the ortho office and see what it is they need? Thanks

## 2013-05-01 NOTE — Telephone Encounter (Signed)
THANK YOU-- I was trying to find out if this was a referral or a medical clearance and it was most certainly not clear  I will do ref to ortho right now -thanks

## 2013-05-01 NOTE — Telephone Encounter (Signed)
Patient called back and said I had "caught her off guard" with the first call.  She said she had dropped this off some time ago and she is in severe pain and thought it would have been completed by now.  She now says that the insurance lady called her and said they needed a referral from her PCP in order for the process to move forward.  She says there is a lady's name "Cecille Rubin" and a fax number for it to be faxed ASAP.

## 2013-05-02 NOTE — Telephone Encounter (Signed)
Thank you - what a long drawn out mess

## 2013-05-02 NOTE — Telephone Encounter (Signed)
I called [place] b/c I believe the confusion was whether this patient had [facility] and needed a referral to be seen by Dr Nelva Bush as this insurance requires. I spoke to the person who called this patient and they did think that she had this type of   Insurance. I called the patients insurance co and verified what she actually had which is NOT the HMO and therefore does NOT need anything from our office to have the injection from Dr Nelva Bush. I called the patient to let her know all of this information and I called [facility] back and told them this as well. I asked them to please expedite getting the appointment scheduled for our patient to have the injection as she is in a lot of pain. They do have to call her insurance to get an Authorization but they do not need anything from [place] and they and the patient are now aware.

## 2013-07-26 ENCOUNTER — Other Ambulatory Visit: Payer: Self-pay | Admitting: Family Medicine

## 2013-07-26 NOTE — Telephone Encounter (Signed)
Please refill for 6 months 

## 2013-10-24 ENCOUNTER — Ambulatory Visit (INDEPENDENT_AMBULATORY_CARE_PROVIDER_SITE_OTHER): Payer: Medicare PPO | Admitting: Family Medicine

## 2013-10-24 ENCOUNTER — Ambulatory Visit: Payer: Medicare PPO | Admitting: Family Medicine

## 2013-10-24 ENCOUNTER — Encounter: Payer: Self-pay | Admitting: Family Medicine

## 2013-10-24 VITALS — BP 140/68 | HR 73 | Temp 98.5°F | Ht 65.25 in | Wt 185.5 lb

## 2013-10-24 DIAGNOSIS — R6884 Jaw pain: Secondary | ICD-10-CM | POA: Insufficient documentation

## 2013-10-24 DIAGNOSIS — R51 Headache: Secondary | ICD-10-CM

## 2013-10-24 DIAGNOSIS — R5383 Other fatigue: Principal | ICD-10-CM

## 2013-10-24 DIAGNOSIS — R5381 Other malaise: Secondary | ICD-10-CM

## 2013-10-24 DIAGNOSIS — R519 Headache, unspecified: Secondary | ICD-10-CM

## 2013-10-24 NOTE — Patient Instructions (Signed)
Labs today  If symptoms suddenly worsen please let me know  Keep well hydrated and eat a balanced diet  Will update with lab results

## 2013-10-24 NOTE — Progress Notes (Signed)
Pre visit review using our clinic review tool, if applicable. No additional management support is needed unless otherwise documented below in the visit note. 
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
## 2013-10-25 LAB — CBC WITH DIFFERENTIAL/PLATELET
BASOS PCT: 0.7 % (ref 0.0–3.0)
Basophils Absolute: 0.1 10*3/uL (ref 0.0–0.1)
Eosinophils Absolute: 0.2 10*3/uL (ref 0.0–0.7)
Eosinophils Relative: 2.4 % (ref 0.0–5.0)
HCT: 41.7 % (ref 36.0–46.0)
HEMOGLOBIN: 13.9 g/dL (ref 12.0–15.0)
LYMPHS PCT: 29.2 % (ref 12.0–46.0)
Lymphs Abs: 2.7 10*3/uL (ref 0.7–4.0)
MCHC: 33.4 g/dL (ref 30.0–36.0)
MCV: 92 fl (ref 78.0–100.0)
MONOS PCT: 7.8 % (ref 3.0–12.0)
Monocytes Absolute: 0.7 10*3/uL (ref 0.1–1.0)
NEUTROS ABS: 5.6 10*3/uL (ref 1.4–7.7)
Neutrophils Relative %: 59.9 % (ref 43.0–77.0)
Platelets: 304 10*3/uL (ref 150.0–400.0)
RBC: 4.53 Mil/uL (ref 3.87–5.11)
RDW: 13.3 % (ref 11.5–15.5)
WBC: 9.4 10*3/uL (ref 4.0–10.5)

## 2013-10-25 LAB — COMPREHENSIVE METABOLIC PANEL
ALT: 15 U/L (ref 0–35)
AST: 25 U/L (ref 0–37)
Albumin: 4.3 g/dL (ref 3.5–5.2)
Alkaline Phosphatase: 45 U/L (ref 39–117)
BUN: 13 mg/dL (ref 6–23)
CALCIUM: 9.8 mg/dL (ref 8.4–10.5)
CHLORIDE: 103 meq/L (ref 96–112)
CO2: 30 meq/L (ref 19–32)
CREATININE: 0.9 mg/dL (ref 0.4–1.2)
GFR: 61.91 mL/min (ref >60.00)
Glucose, Bld: 86 mg/dL (ref 70–99)
Potassium: 4.9 mEq/L (ref 3.5–5.1)
Sodium: 140 mEq/L (ref 135–145)
Total Bilirubin: 0.5 mg/dL (ref 0.2–1.2)
Total Protein: 7.6 g/dL (ref 6.0–8.3)

## 2013-10-25 LAB — SEDIMENTATION RATE: Sed Rate: 27 mm/hr — ABNORMAL HIGH (ref 0–22)

## 2013-10-25 LAB — TSH: TSH: 1.45 u[IU]/mL (ref 0.35–4.50)

## 2013-10-25 LAB — VITAMIN B12: VITAMIN B 12: 257 pg/mL (ref 211–911)

## 2013-10-25 NOTE — Assessment & Plan Note (Signed)
Sed rate today Disc poss of ongoing sinus or dental problem as well No recent vision change

## 2013-10-25 NOTE — Assessment & Plan Note (Signed)
Lab today ? If multifactorial or post infectious Disc poss of TA or PMR or onoing tooth or sinus infx If labs nl will return to dentist first

## 2014-01-01 ENCOUNTER — Other Ambulatory Visit: Payer: Self-pay

## 2014-01-01 DIAGNOSIS — Z1231 Encounter for screening mammogram for malignant neoplasm of breast: Secondary | ICD-10-CM

## 2014-02-05 ENCOUNTER — Ambulatory Visit: Payer: Medicare PPO

## 2014-02-07 ENCOUNTER — Other Ambulatory Visit: Payer: Self-pay | Admitting: Family Medicine

## 2014-02-07 NOTE — Telephone Encounter (Signed)
Please refill for a year  

## 2014-02-07 NOTE — Telephone Encounter (Signed)
done

## 2014-02-07 NOTE — Telephone Encounter (Signed)
Electronic refill request, please advise  

## 2014-02-19 ENCOUNTER — Ambulatory Visit
Admission: RE | Admit: 2014-02-19 | Discharge: 2014-02-19 | Disposition: A | Discharge status: Home / Self Care | Payer: Medicare PPO | Source: Ambulatory Visit

## 2014-02-19 DIAGNOSIS — Z1231 Encounter for screening mammogram for malignant neoplasm of breast: Secondary | ICD-10-CM

## 2014-02-20 ENCOUNTER — Encounter: Payer: Self-pay | Admitting: *Deleted

## 2014-05-29 ENCOUNTER — Encounter: Payer: Medicare PPO | Admitting: Family Medicine

## 2014-07-16 ENCOUNTER — Ambulatory Visit (INDEPENDENT_AMBULATORY_CARE_PROVIDER_SITE_OTHER): Payer: Medicare PPO | Admitting: Family Medicine

## 2014-07-16 ENCOUNTER — Encounter: Payer: Self-pay | Admitting: Family Medicine

## 2014-07-16 VITALS — BP 142/78 | HR 69 | Temp 98.0°F | Ht 65.5 in | Wt 189.5 lb

## 2014-07-16 DIAGNOSIS — E782 Mixed hyperlipidemia: Secondary | ICD-10-CM

## 2014-07-16 DIAGNOSIS — Z23 Encounter for immunization: Secondary | ICD-10-CM | POA: Diagnosis not present

## 2014-07-16 DIAGNOSIS — Z0000 Encounter for general adult medical examination without abnormal findings: Secondary | ICD-10-CM | POA: Diagnosis not present

## 2014-07-16 DIAGNOSIS — Z1211 Encounter for screening for malignant neoplasm of colon: Secondary | ICD-10-CM

## 2014-07-16 DIAGNOSIS — E669 Obesity, unspecified: Secondary | ICD-10-CM

## 2014-07-16 LAB — COMPREHENSIVE METABOLIC PANEL
ALK PHOS: 41 U/L (ref 39–117)
ALT: 14 U/L (ref 0–35)
AST: 25 U/L (ref 0–37)
Albumin: 4.2 g/dL (ref 3.5–5.2)
BILIRUBIN TOTAL: 0.4 mg/dL (ref 0.2–1.2)
BUN: 18 mg/dL (ref 6–23)
CO2: 31 meq/L (ref 19–32)
Calcium: 9.8 mg/dL (ref 8.4–10.5)
Chloride: 102 mEq/L (ref 96–112)
Creatinine, Ser: 0.9 mg/dL (ref 0.40–1.20)
GFR: 64.18 mL/min (ref >60.00)
Glucose, Bld: 96 mg/dL (ref 70–99)
Potassium: 4.1 mEq/L (ref 3.5–5.1)
SODIUM: 138 meq/L (ref 135–145)
Total Protein: 7.6 g/dL (ref 6.0–8.3)

## 2014-07-16 LAB — LIPID PANEL
Cholesterol: 272 mg/dL — ABNORMAL HIGH (ref 0–200)
HDL: 56.3 mg/dL (ref >39.00)
LDL CALC: 186 mg/dL — AB (ref 0–99)
NonHDL: 215.7
TRIGLYCERIDES: 151 mg/dL — AB (ref 0.0–149.0)
Total CHOL/HDL Ratio: 5
VLDL: 30.2 mg/dL (ref 0.0–40.0)
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
## 2014-07-16 NOTE — Patient Instructions (Signed)
Labs today  Please do stool cards  prevnar vaccine today If you are interested in a shingles/zoster vaccine - call your insurance to check on coverage,( you should not get it within 1 month of other vaccines) , then call us for a prescription  for it to take to a pharmacy that gives the shot , or make a nurse visit to get it here depending on your coverage Please work on an Advance directive - the materials I gave you  Take care of yourself

## 2014-07-16 NOTE — Progress Notes (Signed)
Pre visit review using our clinic review tool, if applicable. No additional management support is needed unless otherwise documented below in the visit note. 

## 2014-07-17 ENCOUNTER — Telehealth: Payer: Self-pay

## 2014-07-17 NOTE — Telephone Encounter (Signed)
Left message for patient to call office regarding lab results.

## 2014-07-17 NOTE — Telephone Encounter (Signed)
-----   Message from Abner Greenspan, MD sent at 07/16/2014  5:37 PM EDT ----- Cholesterol is very high Is she taking her simvastatin?

## 2014-07-18 NOTE — Assessment & Plan Note (Signed)
Disc goals for lipids and reasons to control them Rev labs with pt Rev low sat fat diet in detail Continue simvastatin  

## 2014-07-18 NOTE — Assessment & Plan Note (Signed)
Will order IFOB cards in light of age over 40

## 2014-07-18 NOTE — Assessment & Plan Note (Signed)
Discussed how this problem influences overall health and the risks it imposes  Reviewed plan for weight loss with lower calorie diet (via better food choices and also portion control or program like weight watchers) and exercise building up to or more than 30 minutes 5 days per week including some aerobic activity   (exercise as tolerated)

## 2014-07-18 NOTE — Assessment & Plan Note (Addendum)
Reviewed health habits including diet and exercise and skin cancer prevention Reviewed appropriate screening tests for age  Her updated provider list is located in the scanned document that was reviewed with pt today Also reviewed health mt list, fam hx and immunization status , as well as social and family history   See HPI Labs today  Please do stool cards  prevnar vaccine today If you are interested in a shingles/zoster vaccine - call your insurance to check on coverage,( you should not get it within 1 month of other vaccines) , then call us for a prescription  for it to take to a pharmacy that gives the shot , or make a nurse visit to get it here depending on your coverage Please work on an Advance directive - the materials I gave you

## 2014-07-25 NOTE — Progress Notes (Signed)
   Subjective:    Patient ID: Cindy Davies, female    DOB: October 16, 1933, 81 y.o.   MRN: [id number]  HPI The patient's updated provider list is located in scanned document we reviewed    Review of Systems     Objective:   Physical Exam        Assessment & Plan:

## 2014-07-30 ENCOUNTER — Other Ambulatory Visit (INDEPENDENT_AMBULATORY_CARE_PROVIDER_SITE_OTHER): Payer: Medicare PPO

## 2014-07-30 DIAGNOSIS — Z1211 Encounter for screening for malignant neoplasm of colon: Secondary | ICD-10-CM | POA: Diagnosis not present

## 2014-08-01 LAB — FECAL OCCULT BLOOD, IMMUNOCHEMICAL: FECAL OCCULT BLD: NEGATIVE

## 2014-08-02 ENCOUNTER — Telehealth: Payer: Self-pay

## 2014-08-02 NOTE — Telephone Encounter (Signed)
Left message informing patient of negative stool card.

## 2014-08-02 NOTE — Telephone Encounter (Signed)
-----   Message from Abner Greenspan, MD sent at 08/02/2014  8:06 AM EDT ----- Stool card negative, please alert pt and note for health mt

## 2014-08-06 ENCOUNTER — Telehealth: Payer: Self-pay

## 2014-08-06 DIAGNOSIS — Z9181 History of falling: Secondary | ICD-10-CM | POA: Insufficient documentation

## 2014-08-06 MED ORDER — FOLDING WALKER MISC: 1.0000 | Status: DC | PRN

## 2014-08-06 NOTE — Telephone Encounter (Signed)
Pt left v/m requesting cb about prescription for folding walker that has a seat with a wide webbing for pts back support and safety. Pt request cb when rx ready for pick up. Pt had annual exam 07/16/14.

## 2014-08-06 NOTE — Telephone Encounter (Signed)
Left message informing patient that her rx for a walker is ready for pick up.

## 2014-08-06 NOTE — Telephone Encounter (Signed)
Printed px

## 2014-12-23 ENCOUNTER — Other Ambulatory Visit: Payer: Self-pay | Admitting: Dermatology

## 2014-12-23 DIAGNOSIS — L821 Other seborrheic keratosis: Secondary | ICD-10-CM | POA: Diagnosis not present

## 2014-12-23 DIAGNOSIS — B079 Viral wart, unspecified: Secondary | ICD-10-CM | POA: Diagnosis not present

## 2014-12-23 DIAGNOSIS — D485 Neoplasm of uncertain behavior of skin: Secondary | ICD-10-CM | POA: Diagnosis not present

## 2015-01-20 ENCOUNTER — Other Ambulatory Visit: Payer: Self-pay

## 2015-01-20 DIAGNOSIS — Z1231 Encounter for screening mammogram for malignant neoplasm of breast: Secondary | ICD-10-CM

## 2015-02-21 ENCOUNTER — Ambulatory Visit
Admission: RE | Admit: 2015-02-21 | Discharge: 2015-02-21 | Disposition: A | Discharge status: Home / Self Care | Payer: Medicare PPO | Source: Ambulatory Visit

## 2015-02-21 DIAGNOSIS — Z1231 Encounter for screening mammogram for malignant neoplasm of breast: Secondary | ICD-10-CM | POA: Diagnosis not present

## 2015-02-24 ENCOUNTER — Encounter: Payer: Self-pay | Admitting: Family Medicine

## 2015-02-24 ENCOUNTER — Encounter: Payer: Self-pay | Admitting: *Deleted

## 2015-02-24 LAB — HM MAMMOGRAPHY: HM MAMMO: NORMAL

## 2015-03-14 ENCOUNTER — Other Ambulatory Visit: Payer: Self-pay | Admitting: Family Medicine

## 2015-04-07 DIAGNOSIS — Z961 Presence of intraocular lens: Secondary | ICD-10-CM | POA: Diagnosis not present

## 2015-04-29 ENCOUNTER — Encounter: Payer: Self-pay | Admitting: Family Medicine

## 2015-04-29 ENCOUNTER — Ambulatory Visit (INDEPENDENT_AMBULATORY_CARE_PROVIDER_SITE_OTHER): Payer: Medicare Other | Admitting: Family Medicine

## 2015-04-29 VITALS — BP 130/62 | HR 78 | Temp 98.5°F | Ht 66.0 in | Wt 177.2 lb

## 2015-04-29 DIAGNOSIS — J3489 Other specified disorders of nose and nasal sinuses: Secondary | ICD-10-CM | POA: Diagnosis not present

## 2015-04-29 DIAGNOSIS — K5900 Constipation, unspecified: Secondary | ICD-10-CM | POA: Insufficient documentation

## 2015-04-29 NOTE — Patient Instructions (Signed)
I think you had a GI virus that resolved and now you are back to regular constipation  Take your miralax on your regular schedule Keep up your fluids Update if not starting to improve in a week or if worsening    Stop at check out for referral to ENT
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
## 2015-04-29 NOTE — Progress Notes (Signed)
Pre visit review using our clinic review tool, if applicable. No additional management support is needed unless otherwise documented below in the visit note. 

## 2015-05-01 NOTE — Assessment & Plan Note (Signed)
Pt had recent GI virus - and now is back to constipation Enc fluids to prevent dehydration Will get back on her regular miralax protocol Update if return of any n/v/d or any abd pain

## 2015-05-01 NOTE — Assessment & Plan Note (Signed)
Ongoing L maxillary Unchanged after 2 rounds of augmentin  Fluid seen in sinus on CT  Will ref to ENt for further eval

## 2015-07-11 ENCOUNTER — Telehealth: Payer: Self-pay | Admitting: Family Medicine

## 2015-07-11 ENCOUNTER — Emergency Department (HOSPITAL_COMMUNITY): Payer: Medicare Other

## 2015-07-11 ENCOUNTER — Encounter (HOSPITAL_COMMUNITY): Payer: Self-pay

## 2015-07-11 ENCOUNTER — Emergency Department (HOSPITAL_COMMUNITY)
Admission: EM | Admit: 2015-07-11 | Discharge: 2015-07-11 | Disposition: A | Discharge status: Home / Self Care | Payer: Medicare Other | Attending: Emergency Medicine | Admitting: Emergency Medicine

## 2015-07-11 DIAGNOSIS — Z8619 Personal history of other infectious and parasitic diseases: Secondary | ICD-10-CM | POA: Diagnosis not present

## 2015-07-11 DIAGNOSIS — R55 Syncope and collapse: Secondary | ICD-10-CM | POA: Diagnosis not present

## 2015-07-11 DIAGNOSIS — F329 Major depressive disorder, single episode, unspecified: Secondary | ICD-10-CM | POA: Diagnosis not present

## 2015-07-11 DIAGNOSIS — Z79899 Other long term (current) drug therapy: Secondary | ICD-10-CM | POA: Insufficient documentation

## 2015-07-11 DIAGNOSIS — M1990 Unspecified osteoarthritis, unspecified site: Secondary | ICD-10-CM | POA: Diagnosis not present

## 2015-07-11 DIAGNOSIS — Z7982 Long term (current) use of aspirin: Secondary | ICD-10-CM | POA: Diagnosis not present

## 2015-07-11 DIAGNOSIS — Z8719 Personal history of other diseases of the digestive system: Secondary | ICD-10-CM | POA: Diagnosis not present

## 2015-07-11 DIAGNOSIS — R531 Weakness: Secondary | ICD-10-CM | POA: Diagnosis present

## 2015-07-11 LAB — CBC WITH DIFFERENTIAL/PLATELET
BASOS ABS: 0.1 10*3/uL (ref 0.0–0.1)
BASOS PCT: 1 %
EOS PCT: 1 %
Eosinophils Absolute: 0.1 10*3/uL (ref 0.0–0.7)
HCT: 43.7 % (ref 36.0–46.0)
Hemoglobin: 14.1 g/dL (ref 12.0–15.0)
LYMPHS PCT: 17 %
Lymphs Abs: 1.6 10*3/uL (ref 0.7–4.0)
MCH: 30.7 pg (ref 26.0–34.0)
MCHC: 32.3 g/dL (ref 30.0–36.0)
MCV: 95.2 fL (ref 78.0–100.0)
Monocytes Absolute: 0.5 10*3/uL (ref 0.1–1.0)
Monocytes Relative: 5 %
Neutro Abs: 7.3 10*3/uL (ref 1.7–7.7)
Neutrophils Relative %: 76 %
PLATELETS: 301 10*3/uL (ref 150–400)
RBC: 4.59 MIL/uL (ref 3.87–5.11)
RDW: 13.5 % (ref 11.5–15.5)
WBC: 9.6 10*3/uL (ref 4.0–10.5)

## 2015-07-11 LAB — URINALYSIS, ROUTINE W REFLEX MICROSCOPIC
Bilirubin Urine: NEGATIVE
Glucose, UA: NEGATIVE mg/dL
Hgb urine dipstick: NEGATIVE
Ketones, ur: 15 mg/dL — AB
Nitrite: NEGATIVE
Protein, ur: NEGATIVE mg/dL
Specific Gravity, Urine: 1.015 (ref 1.005–1.030)
pH: 7.5 (ref 5.0–8.0)

## 2015-07-11 LAB — COMPREHENSIVE METABOLIC PANEL
ALBUMIN: 4.3 g/dL (ref 3.5–5.0)
ALT: 17 U/L (ref 14–54)
AST: 28 U/L (ref 15–41)
Alkaline Phosphatase: 46 U/L (ref 38–126)
Anion gap: 9 (ref 5–15)
BUN: 16 mg/dL (ref 6–20)
CHLORIDE: 101 mmol/L (ref 101–111)
CO2: 28 mmol/L (ref 22–32)
CREATININE: 0.87 mg/dL (ref 0.44–1.00)
Calcium: 9.2 mg/dL (ref 8.9–10.3)
GFR calc Af Amer: >60 mL/min (ref >60)
GLUCOSE: 107 mg/dL — AB (ref 65–99)
POTASSIUM: 3.7 mmol/L (ref 3.5–5.1)
SODIUM: 138 mmol/L (ref 135–145)
Total Bilirubin: 0.7 mg/dL (ref 0.3–1.2)
Total Protein: 7.7 g/dL (ref 6.5–8.1)

## 2015-07-11 LAB — URINE MICROSCOPIC-ADD ON

## 2015-07-11 LAB — I-STAT TROPONIN, ED: Troponin i, poc: 0 ng/mL (ref 0.00–0.08)

## 2015-07-11 MED ORDER — SODIUM CHLORIDE 0.9 % IV BOLUS (SEPSIS)
500.0000 mL | Freq: Once | INTRAVENOUS | Status: AC
  Administered 2015-07-11: 500 mL via INTRAVENOUS

## 2015-07-11 NOTE — Telephone Encounter (Signed)
Per chart review pt is at [facility] ED now.

## 2015-07-11 NOTE — Telephone Encounter (Signed)
Patient Name: LANYA WHYNOT DOB: February 29, 1934 Initial Comment Caller stated that wife is having trouble seeing eyes are foggy temp was high does not know temp bp was 93/59 to low of blood pressure current bp:101/70 also vomiting with chills. Nurse Assessment Nurse: Vallery Sa, RN, Cathy Date/Time (Eastern Time): 07/11/2015 1:37:57 PM Confirm and document reason for call. If symptomatic, describe symptoms. You must click the next button to save text entered. ---Caller states that her blood pressure was 93/59 this morning. It was 101/70 about 30 minutes ago. No severe breathing difficulty. She developed vomiting (about 1 episode of vomiting) and fever this morning (estimates her fever is low grade). Has the patient traveled out of the country within the last 30 days? ---No Does the patient have any new or worsening symptoms? ---Yes Will a triage be completed? ---Yes Related visit to physician within the last 2 weeks? ---No Does the PT have any chronic conditions? (i.e. diabetes, asthma, etc.) ---No Is this a behavioral health or substance abuse call? ---No Guidelines Guideline Title Affirmed Question Affirmed Notes Low Blood Pressure Shock suspected (e.g., cold/pale/clammy skin, too weak to stand, low BP, rapid pulse) Final Disposition User Call EMS 911 Now Vallery Sa, RN, [place] Disagree/[facility]: [facility] is there now.

## 2015-07-11 NOTE — ED Provider Notes (Signed)
Medical screening examination/treatment/procedure(s) were conducted as a shared visit with non-physician practitioner(s) and myself.  I personally evaluated the patient during the encounter.   EKG Interpretation None       Results for orders placed or performed during the hospital encounter of 07/11/15  CBC with Differential  Result Value Ref Range   WBC 9.6 4.0 - 10.5 K/uL   RBC 4.59 3.87 - 5.11 MIL/uL   Hemoglobin 14.1 12.0 - 15.0 g/dL   HCT 43.7 36.0 - 46.0 %   MCV 95.2 78.0 - 100.0 fL   MCH 30.7 26.0 - 34.0 pg   MCHC 32.3 30.0 - 36.0 g/dL   RDW 13.5 11.5 - 15.5 %   Platelets 301 150 - 400 K/uL   Neutrophils Relative % 76 %   Neutro Abs 7.3 1.7 - 7.7 K/uL   Lymphocytes Relative 17 %   Lymphs Abs 1.6 0.7 - 4.0 K/uL   Monocytes Relative 5 %   Monocytes Absolute 0.5 0.1 - 1.0 K/uL   Eosinophils Relative 1 %   Eosinophils Absolute 0.1 0.0 - 0.7 K/uL   Basophils Relative 1 %   Basophils Absolute 0.1 0.0 - 0.1 K/uL  Comprehensive metabolic panel  Result Value Ref Range   Sodium 138 135 - 145 mmol/L   Potassium 3.7 3.5 - 5.1 mmol/L   Chloride 101 101 - 111 mmol/L   CO2 28 22 - 32 mmol/L   Glucose, Bld 107 (H) 65 - 99 mg/dL   BUN 16 6 - 20 mg/dL   Creatinine, Ser 0.87 0.44 - 1.00 mg/dL   Calcium 9.2 8.9 - 10.3 mg/dL   Total Protein 7.7 6.5 - 8.1 g/dL   Albumin 4.3 3.5 - 5.0 g/dL   AST 28 15 - 41 U/L   ALT 17 14 - 54 U/L   Alkaline Phosphatase 46 38 - 126 U/L   Total Bilirubin 0.7 0.3 - 1.2 mg/dL   GFR calc non Af Amer >60 >60 mL/min   GFR calc Af Amer >60 >60 mL/min   Anion gap 9 5 - 15  Urinalysis, Routine w reflex microscopic (not at [facility])  Result Value Ref Range   Color, Urine YELLOW YELLOW   APPearance CLOUDY (A) CLEAR   Specific Gravity, Urine 1.015 1.005 - 1.030   pH 7.5 5.0 - 8.0   Glucose, UA NEGATIVE NEGATIVE mg/dL   Hgb urine dipstick NEGATIVE NEGATIVE   Bilirubin Urine NEGATIVE NEGATIVE   Ketones, ur 15 (A) NEGATIVE mg/dL   Protein, ur NEGATIVE  NEGATIVE mg/dL   Nitrite NEGATIVE NEGATIVE   Leukocytes, UA MODERATE (A) NEGATIVE  Urine microscopic-add on  Result Value Ref Range   Squamous Epithelial / LPF 6-30 (A) NONE SEEN   WBC, UA 6-30 0 - 5 WBC/hpf   RBC / HPF 0-5 0 - 5 RBC/hpf   Bacteria, UA FEW (A) NONE SEEN  I-stat troponin, ED  Result Value Ref Range   Troponin i, poc 0.00 0.00 - 0.08 ng/mL   Comment 3           Dg Chest 2 View  07/11/2015  CLINICAL DATA:  Generalized weakness EXAM: CHEST  2 VIEW COMPARISON:  03/16/2004 chest radiograph. FINDINGS: Stable cardiomediastinal silhouette with normal heart size. No pneumothorax. No pleural effusion. Lungs appear clear, with no acute consolidative airspace disease and no pulmonary edema. IMPRESSION: No active cardiopulmonary disease. Electronically Signed   By: Ilona Sorrel M.D.   On: 07/11/2015 16:49    ED ECG REPORT  Date: 07/11/2015  Rate: 73  Rhythm: normal sinus rhythm  QRS Axis: normal  Intervals: normal  ST/T Wave abnormalities: normal  Conduction Disutrbances:none  Narrative Interpretation:   Old EKG Reviewed: none available  I have personally reviewed the EKG tracing and agree with the computerized printout as noted.     Patient with 2 near syncopal episodes this morning 3 hours apart. Patient now feels fine. Labs without any significant abnormalities. EKG without any arrhythmia. Patient has some residual right leg weakness from old injury nothing new. Patient is able to ambulate fine patient is stable for discharge home. Patient's heart regular rate and rhythm lungs are clear bilaterally abdomen soft and nontender. Able to move all 4 extremities. But does have residual weakness to the right leg.  Fredia Sorrow, MD 07/11/15 [id number]

## 2015-07-11 NOTE — Telephone Encounter (Signed)
I will look for notes,thanks
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
## 2015-07-11 NOTE — ED Notes (Signed)
Pt ambulated in hall without assistance pt stated that this is normal gate for her and she is not dizzy or un stable RN made aware

## 2015-07-11 NOTE — ED Notes (Signed)
Bed: [facility] Expected date:  Expected time:  Means of arrival:  Comments: 

## 2015-07-11 NOTE — ED Notes (Signed)
After getting up this morning she stated she started to feel to dizzy.  Both arms and legs affected, she was holing onto the counter to stay up.  Husband took her blood pressure (value unknown) and then she felt better.  Then the episode happen all over again and they called EMS.

## 2015-07-11 NOTE — Discharge Instructions (Signed)
1. Medications: usual home medications 2. Treatment: rest, drink plenty of fluids,  3. Follow Up: Please followup with your primary doctor in 2 days for discussion of your diagnoses and further evaluation after today's visit; if you do not have a primary care doctor use the resource guide provided to find one; Please return to the ER for recurrence of symptoms, worsening or new symptoms    Near-Syncope Near-syncope (commonly known as near fainting) is sudden weakness, dizziness, or feeling like you might pass out. During an episode of near-syncope, you may also develop pale skin, have tunnel vision, or feel sick to your stomach (nauseous). Near-syncope may occur when getting up after sitting or while standing for a long time. It is caused by a sudden decrease in blood flow to the brain. This decrease can result from various causes or triggers, most of which are not serious. However, because near-syncope can sometimes be a sign of something serious, a medical evaluation is required. The specific cause is often not determined. HOME CARE INSTRUCTIONS  Monitor your condition for any changes. The following actions may help to alleviate any discomfort you are experiencing:  Have someone stay with you until you feel stable.  Lie down right away and prop your feet up if you start feeling like you might faint. Breathe deeply and steadily. Wait until all the symptoms have passed. Most of these episodes last only a few minutes. You may feel tired for several hours.   Drink enough fluids to keep your urine clear or pale yellow.   If you are taking blood pressure or heart medicine, get up slowly when seated or lying down. Take several minutes to sit and then stand. This can reduce dizziness.  Follow up with your health care provider as directed. SEEK IMMEDIATE MEDICAL CARE IF:   You have a severe headache.   You have unusual pain in the chest, abdomen, or back.   You are bleeding from the mouth or  rectum, or you have black or tarry stool.   You have an irregular or very fast heartbeat.   You have repeated fainting or have seizure-like jerking during an episode.   You faint when sitting or lying down.   You have confusion.   You have difficulty walking.   You have severe weakness.   You have vision problems.  MAKE SURE YOU:   Understand these instructions.  Will watch your condition.  Will get help right away if you are not doing well or get worse.   This information is not intended to replace advice given to you by your health care provider. Make sure you discuss any questions you have with your health care provider.   Document Released: 04/12/2005 Document Revised: 04/17/2013 Document Reviewed: 09/15/2012 Elsevier Interactive Patient Education [employer].

## 2015-07-13 ENCOUNTER — Other Ambulatory Visit: Payer: Self-pay | Admitting: Family Medicine

## 2015-07-13 LAB — URINE CULTURE

## 2015-07-14 ENCOUNTER — Other Ambulatory Visit: Payer: Self-pay

## 2015-07-14 MED ORDER — SIMVASTATIN 40 MG PO TABS: ORAL_TABLET | ORAL | Status: DC

## 2015-07-14 NOTE — Telephone Encounter (Signed)
done

## 2015-07-14 NOTE — Telephone Encounter (Signed)
Pt left v/m requesting refill simvastatin to [facility]; pt last seen 07/16/14 annual exam and pt has ED f/u scheduled on 07/15/15; last refilled # 90 x 1 on 10/25/2012.Please advise.

## 2015-07-14 NOTE — Telephone Encounter (Signed)
Please refill for a year  

## 2015-07-15 ENCOUNTER — Ambulatory Visit (INDEPENDENT_AMBULATORY_CARE_PROVIDER_SITE_OTHER): Payer: Medicare Other | Admitting: Family Medicine

## 2015-07-15 ENCOUNTER — Encounter: Payer: Self-pay | Admitting: Family Medicine

## 2015-07-15 VITALS — BP 112/58 | HR 66 | Temp 98.9°F | Ht 66.0 in | Wt 177.2 lb

## 2015-07-15 DIAGNOSIS — E782 Mixed hyperlipidemia: Secondary | ICD-10-CM | POA: Diagnosis not present

## 2015-07-15 DIAGNOSIS — R55 Syncope and collapse: Secondary | ICD-10-CM

## 2015-07-15 MED ORDER — SIMVASTATIN 40 MG PO TABS: ORAL_TABLET | ORAL | Status: DC

## 2015-07-15 MED ORDER — PAROXETINE HCL 20 MG PO TABS: ORAL_TABLET | ORAL | Status: DC

## 2015-07-15 NOTE — Patient Instructions (Signed)
I'm glad you are doing better  Drink fluids You can have a little extra salt  Check your blood pressure a few times per week- if it runs less than 90/50 let me know  If you have any more episodes - notify us right away   Make a lab appointment for cholesterol in about 6 weeks - after getting back on simvastain

## 2015-07-15 NOTE — Assessment & Plan Note (Signed)
3/17-had 2 episodes in 1 day Rev hospital notes and studies and labs in detail with pt  She feels fine now  May have been from hypotension (not orthostatic today) Reassuring baseline exam Disc imp of fluids Can inc sodium a bit - watching bp  If this re occurs -inst to alert [place] and would consider brain imaging

## 2015-07-15 NOTE — Progress Notes (Signed)
Pre visit review using our clinic review tool, if applicable. No additional management support is needed unless otherwise documented below in the visit note. 

## 2015-07-15 NOTE — Assessment & Plan Note (Signed)
Out of simvastatin Refilled this Check lipid in 6 wk Diet is fairly good
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
## 2015-08-28 ENCOUNTER — Other Ambulatory Visit (INDEPENDENT_AMBULATORY_CARE_PROVIDER_SITE_OTHER): Payer: Medicare Other

## 2015-08-28 ENCOUNTER — Encounter: Payer: Self-pay | Admitting: *Deleted

## 2015-08-28 DIAGNOSIS — E782 Mixed hyperlipidemia: Secondary | ICD-10-CM | POA: Diagnosis not present

## 2015-08-28 LAB — LIPID PANEL
CHOL/HDL RATIO: 3
CHOLESTEROL: 175 mg/dL (ref 0–200)
HDL: 59.3 mg/dL (ref >39.00)
LDL CALC: 94 mg/dL (ref 0–99)
NONHDL: 115.75
Triglycerides: 107 mg/dL (ref 0.0–149.0)
VLDL: 21.4 mg/dL (ref 0.0–40.0)

## 2015-08-28 LAB — AST: AST: 20 U/L (ref 0–37)

## 2015-08-28 LAB — ALT: ALT: 12 U/L (ref 0–35)

## 2015-09-29 ENCOUNTER — Telehealth: Payer: Self-pay

## 2015-09-29 NOTE — Telephone Encounter (Signed)
Pt left v/m requesting rx for bladder control without making appt. Pt last annual exam on 07/16/14 and last office visit 07/15/15.[facility].

## 2015-09-29 NOTE — Telephone Encounter (Signed)
Pt notified of Dr. Marliss Coots comments and instructions and appt scheduled

## 2015-09-29 NOTE — Telephone Encounter (Signed)
Please schedule 30 min office visit  We need to get ua and examine her and find out what kind of bladder control problem she has (meds work for some but not others and also have significant side eff we would need to review if applicable)

## 2015-09-30 ENCOUNTER — Ambulatory Visit (INDEPENDENT_AMBULATORY_CARE_PROVIDER_SITE_OTHER): Payer: Medicare Other | Admitting: Family Medicine

## 2015-09-30 ENCOUNTER — Encounter: Payer: Self-pay | Admitting: Family Medicine

## 2015-09-30 VITALS — BP 132/58 | HR 73 | Temp 98.6°F | Ht 66.0 in | Wt 173.8 lb

## 2015-09-30 DIAGNOSIS — R350 Frequency of micturition: Secondary | ICD-10-CM

## 2015-09-30 DIAGNOSIS — R32 Unspecified urinary incontinence: Secondary | ICD-10-CM | POA: Diagnosis not present

## 2015-09-30 DIAGNOSIS — N3941 Urge incontinence: Secondary | ICD-10-CM | POA: Diagnosis not present

## 2015-09-30 LAB — POC URINALSYSI DIPSTICK (AUTOMATED)
Bilirubin, UA: NEGATIVE
Blood, UA: NEGATIVE
Glucose, UA: NEGATIVE
KETONES UA: NEGATIVE
NITRITE UA: NEGATIVE
PH UA: 6
Spec Grav, UA: 1.025
UROBILINOGEN UA: 0.2

## 2015-09-30 MED ORDER — TOLTERODINE TARTRATE ER 4 MG PO CP24: 4.0000 mg | ORAL_CAPSULE | Freq: Every day | ORAL | Status: DC

## 2015-09-30 NOTE — Patient Instructions (Signed)
I will culture urine to make sure there is no infection  If there is -we will treat it  Cut back coffee and tea and drink water instead  When I tell you it's ok to fill the px for detrol give it a try   Take care of yourself
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
## 2015-09-30 NOTE — Progress Notes (Signed)
Pre visit review using our clinic review tool, if applicable. No additional management support is needed unless otherwise documented below in the visit note. 

## 2015-10-02 ENCOUNTER — Telehealth: Payer: Self-pay | Admitting: Family Medicine

## 2015-10-02 LAB — URINE CULTURE

## 2015-10-02 MED ORDER — SULFAMETHOXAZOLE-TRIMETHOPRIM 800-160 MG PO TABS: 1.0000 | ORAL_TABLET | Freq: Two times a day (BID) | ORAL | Status: DC

## 2015-10-02 NOTE — Telephone Encounter (Signed)
Pt notified of urine cx results and Dr. Marliss Coots comments. Rx sent to pharmacy and pt will start the detrol la once abx is done

## 2015-10-02 NOTE — Telephone Encounter (Signed)
Left voicemail requesting pt to call office back 

## 2015-10-02 NOTE — Assessment & Plan Note (Signed)
Ongoing/worse lately  ua with some leukocytes-sent for cx If neg- disc tx with detrol LA 4 mg daily  Disc side eff incl dry mouth/sedation/constipation  Will update with response

## 2015-10-02 NOTE — Telephone Encounter (Signed)
Urine culture is borderline- multiple types of bacteria Please call in bactrim DS After finishing that she is free to try the detrol la for overactive bladder

## 2015-10-02 NOTE — Assessment & Plan Note (Signed)
Some leukocytes on urine dip Sent for cx tx if poss Also overactive bladder

## 2016-01-27 ENCOUNTER — Other Ambulatory Visit: Payer: Self-pay | Admitting: Family Medicine

## 2016-01-27 DIAGNOSIS — Z1231 Encounter for screening mammogram for malignant neoplasm of breast: Secondary | ICD-10-CM

## 2016-02-23 ENCOUNTER — Ambulatory Visit
Admission: RE | Admit: 2016-02-23 | Discharge: 2016-02-23 | Disposition: A | Discharge status: Home / Self Care | Payer: Medicare Other | Source: Ambulatory Visit | Attending: Family Medicine | Admitting: Family Medicine

## 2016-02-23 DIAGNOSIS — Z1231 Encounter for screening mammogram for malignant neoplasm of breast: Secondary | ICD-10-CM

## 2016-02-23 LAB — HM MAMMOGRAPHY

## 2016-02-25 ENCOUNTER — Encounter: Payer: Self-pay | Admitting: *Deleted

## 2016-05-05 ENCOUNTER — Telehealth: Payer: Self-pay | Admitting: Family Medicine

## 2016-05-05 DIAGNOSIS — Z0000 Encounter for general adult medical examination without abnormal findings: Secondary | ICD-10-CM | POA: Insufficient documentation

## 2016-05-05 NOTE — Telephone Encounter (Signed)
-----   Message from Eustace Pen, LPN sent at [id number]  4:15 PM EST ----- Regarding: Lab Orders 05/06/16 Please place lab orders, if any. Thank you .

## 2016-05-06 ENCOUNTER — Ambulatory Visit (INDEPENDENT_AMBULATORY_CARE_PROVIDER_SITE_OTHER): Payer: Medicare Other

## 2016-05-06 VITALS — BP 112/68 | HR 66 | Temp 98.4°F | Ht 65.5 in | Wt 170.1 lb

## 2016-05-06 DIAGNOSIS — Z0000 Encounter for general adult medical examination without abnormal findings: Secondary | ICD-10-CM

## 2016-05-06 DIAGNOSIS — Z23 Encounter for immunization: Secondary | ICD-10-CM | POA: Diagnosis not present

## 2016-05-06 LAB — CBC WITH DIFFERENTIAL/PLATELET
Basophils Absolute: 0.1 10*3/uL (ref 0.0–0.1)
Basophils Relative: 1.5 % (ref 0.0–3.0)
EOS PCT: 5.2 % — AB (ref 0.0–5.0)
Eosinophils Absolute: 0.3 10*3/uL (ref 0.0–0.7)
HCT: 38.8 % (ref 36.0–46.0)
HEMOGLOBIN: 13.1 g/dL (ref 12.0–15.0)
LYMPHS PCT: 26.5 % (ref 12.0–46.0)
Lymphs Abs: 1.6 10*3/uL (ref 0.7–4.0)
MCHC: 33.9 g/dL (ref 30.0–36.0)
MCV: 91.8 fl (ref 78.0–100.0)
Monocytes Absolute: 0.5 10*3/uL (ref 0.1–1.0)
Monocytes Relative: 7.4 % (ref 3.0–12.0)
Neutro Abs: 3.6 10*3/uL (ref 1.4–7.7)
Neutrophils Relative %: 59.4 % (ref 43.0–77.0)
Platelets: 243 10*3/uL (ref 150.0–400.0)
RBC: 4.22 Mil/uL (ref 3.87–5.11)
RDW: 13.6 % (ref 11.5–15.5)
WBC: 6.1 10*3/uL (ref 4.0–10.5)

## 2016-05-06 LAB — LIPID PANEL
CHOLESTEROL: 191 mg/dL (ref 0–200)
HDL: 63.5 mg/dL (ref >39.00)
LDL Cholesterol: 110 mg/dL — ABNORMAL HIGH (ref 0–99)
NonHDL: 127.16
Total CHOL/HDL Ratio: 3
Triglycerides: 88 mg/dL (ref 0.0–149.0)
VLDL: 17.6 mg/dL (ref 0.0–40.0)

## 2016-05-06 LAB — COMPREHENSIVE METABOLIC PANEL
ALBUMIN: 4.1 g/dL (ref 3.5–5.2)
ALK PHOS: 36 U/L — AB (ref 39–117)
ALT: 11 U/L (ref 0–35)
AST: 22 U/L (ref 0–37)
BILIRUBIN TOTAL: 0.4 mg/dL (ref 0.2–1.2)
BUN: 16 mg/dL (ref 6–23)
CO2: 31 mEq/L (ref 19–32)
Calcium: 9.5 mg/dL (ref 8.4–10.5)
Chloride: 104 mEq/L (ref 96–112)
Creatinine, Ser: 0.92 mg/dL (ref 0.40–1.20)
GFR: 62.28 mL/min (ref >60.00)
Glucose, Bld: 93 mg/dL (ref 70–99)
POTASSIUM: 4.1 meq/L (ref 3.5–5.1)
Sodium: 142 mEq/L (ref 135–145)
TOTAL PROTEIN: 7.2 g/dL (ref 6.0–8.3)

## 2016-05-06 LAB — TSH: TSH: 3.37 u[IU]/mL (ref 0.35–4.50)
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
## 2016-05-06 NOTE — Progress Notes (Signed)
Medical screening examination/treatment/procedure(s) were performed by registered nurse and as supervising non-physician practitioner I was immediately available for consultation/collaboration.  Margie Urbanowicz, NP  

## 2016-05-06 NOTE — Progress Notes (Signed)
PCP notes:   Health maintenance:  Shingles - postponed/insurance Tetanus - postponed/insurance Flu vaccine - administered  Abnormal screenings:   Fall risk - hx of multiple falls without injury  Patient concerns:   None  Nurse concerns:  None  Next PCP appt:   05/14/16 @ 1130

## 2016-05-06 NOTE — Patient Instructions (Addendum)
**Please contact [place] to determine coverage for both Tetanus and Shingles vaccine.    Cindy Davies , Thank you for taking time to come for your Medicare Wellness Visit. I appreciate your ongoing commitment to your health goals. Please review the following plan we discussed and let me know if I can assist you in the future.   These are the goals we discussed: Goals    . Increase physical activity          Starting 05/06/2016, I will continue to exercise for at least 30 min daily.        This is a list of the screening recommended for you and due dates:  Health Maintenance  Topic Date Due  . Shingles Vaccine  05/06/2017*  . Tetanus Vaccine  10/22/2023*  . Mammogram  02/22/2017  . Flu Shot  Completed  . DEXA scan (bone density measurement)  Completed  . Pneumonia vaccines  Completed  *Topic was postponed. The date shown is not the original due date.    Preventive Care for Adults  A healthy lifestyle and preventive care can promote health and wellness. Preventive health guidelines for adults include the following key practices.  . A routine yearly physical is a good way to check with your health care provider about your health and preventive screening. It is a chance to share any concerns and updates on your health and to receive a thorough exam.  . Visit your dentist for a routine exam and preventive care every 6 months. Brush your teeth twice a day and floss once a day. Good oral hygiene prevents tooth decay and gum disease.  . The frequency of eye exams is based on your age, health, family medical history, use  of contact lenses, and other factors. Follow your health care provider's ecommendations for frequency of eye exams.  . Eat a healthy diet. Foods like vegetables, fruits, whole grains, low-fat dairy products, and lean protein foods contain the nutrients you need without too many calories. Decrease your intake of foods high in solid fats, added sugars, and salt. Eat  the right amount of calories for you. Get information about a proper diet from your health care provider, if necessary.  . Regular physical exercise is one of the most important things you can do for your health. Most adults should get at least 150 minutes of moderate-intensity exercise (any activity that increases your heart rate and causes you to sweat) each week. In addition, most adults need muscle-strengthening exercises on 2 or more days a week.  Silver Sneakers may be a benefit available to you. To determine eligibility, you may visit the website: www.silversneakers.com or contact program at 984-048-2219 Mon-Fri between 8AM-8PM.   . Maintain a healthy weight. The body mass index (BMI) is a screening tool to identify possible weight problems. It provides an estimate of body fat based on height and weight. Your health care provider can find your BMI and can help you achieve or maintain a healthy weight.   For adults 20 years and older: ? A BMI below 18.5 is considered underweight. ? A BMI of 18.5 to 24.9 is normal. ? A BMI of 25 to 29.9 is considered overweight. ? A BMI of 30 and above is considered obese.   . Maintain normal blood lipids and cholesterol levels by exercising and minimizing your intake of saturated fat. Eat a balanced diet with plenty of fruit and vegetables. Blood tests for lipids and cholesterol should begin at age 63 and  be repeated every 5 years. If your lipid or cholesterol levels are high, you are over 50, or you are at high risk for heart disease, you may need your cholesterol levels checked more frequently. Ongoing high lipid and cholesterol levels should be treated with medicines if diet and exercise are not working.  . If you smoke, find out from your health care provider how to quit. If you do not use tobacco, please do not start.  . If you choose to drink alcohol, please do not consume more than 2 drinks per day. One drink is considered to be 12 ounces (355 mL)  of beer, 5 ounces (148 mL) of wine, or 1.5 ounces (44 mL) of liquor.  . If you are 51-50 years old, ask your health care provider if you should take aspirin to prevent strokes.  . Use sunscreen. Apply sunscreen liberally and repeatedly throughout the day. You should seek shade when your shadow is shorter than you. Protect yourself by wearing long sleeves, pants, a wide-brimmed hat, and sunglasses year round, whenever you are outdoors.  . Once a month, do a whole body skin exam, using a mirror to look at the skin on your back. Tell your health care provider of new moles, moles that have irregular borders, moles that are larger than a pencil eraser, or moles that have changed in shape or color.

## 2016-05-06 NOTE — Progress Notes (Signed)
Pre visit review using our clinic review tool, if applicable. No additional management support is needed unless otherwise documented below in the visit note. 

## 2016-05-14 ENCOUNTER — Encounter: Payer: Medicare Other | Admitting: Family Medicine

## 2016-05-17 ENCOUNTER — Ambulatory Visit (INDEPENDENT_AMBULATORY_CARE_PROVIDER_SITE_OTHER): Payer: Medicare Other | Admitting: Family Medicine

## 2016-05-17 ENCOUNTER — Encounter: Payer: Self-pay | Admitting: Family Medicine

## 2016-05-17 VITALS — BP 106/56 | HR 66 | Temp 98.3°F | Ht 65.5 in | Wt 167.5 lb

## 2016-05-17 DIAGNOSIS — R519 Headache, unspecified: Secondary | ICD-10-CM

## 2016-05-17 DIAGNOSIS — Z9181 History of falling: Secondary | ICD-10-CM

## 2016-05-17 DIAGNOSIS — E782 Mixed hyperlipidemia: Secondary | ICD-10-CM | POA: Diagnosis not present

## 2016-05-17 DIAGNOSIS — Z8781 Personal history of (healed) traumatic fracture: Secondary | ICD-10-CM

## 2016-05-17 DIAGNOSIS — R51 Headache: Secondary | ICD-10-CM

## 2016-05-17 DIAGNOSIS — Z0000 Encounter for general adult medical examination without abnormal findings: Secondary | ICD-10-CM

## 2016-05-17 DIAGNOSIS — Z8659 Personal history of other mental and behavioral disorders: Secondary | ICD-10-CM

## 2016-05-17 MED ORDER — TOLTERODINE TARTRATE ER 4 MG PO CP24: 4.0000 mg | ORAL_CAPSULE | Freq: Every day | ORAL | 3 refills | Status: DC

## 2016-05-17 MED ORDER — SIMVASTATIN 40 MG PO TABS: ORAL_TABLET | ORAL | 3 refills | Status: DC

## 2016-05-17 MED ORDER — PAROXETINE HCL 20 MG PO TABS: ORAL_TABLET | ORAL | 3 refills | Status: DC

## 2016-05-17 NOTE — Assessment & Plan Note (Signed)
Reviewed health habits including diet and exercise and skin cancer prevention Reviewed appropriate screening tests for age  Also reviewed health mt list, fam hx and immunization status , as well as social and family history   See HPI Rev AMW Doing well Disc fall prev in detail  Will ref to ENT for facial pain if needed She will find out if tetanus and shingles shots are covered here or in a pharmacy and let us know  Ref for cologuard program today as well

## 2016-05-17 NOTE — Assessment & Plan Note (Signed)
Pt does well with paxil  Very active and pos attitude Reviewed stressors/ coping techniques/symptoms/ support sources/ tx options and side effects in detail today

## 2016-05-17 NOTE — Assessment & Plan Note (Addendum)
Chronic L maxillary Has had dental surgeries  She plans to check in one more time with dentist and then request ENT ref if needed  No congestion or other symptoms

## 2016-05-17 NOTE — Assessment & Plan Note (Signed)
Disc goals for lipids and reasons to control them Rev labs with pt Rev low sat fat diet in detail Both HDL and LDL are up a bit Overall ratio is stable
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
## 2016-05-17 NOTE — Progress Notes (Signed)
Pre visit review using our clinic review tool, if applicable. No additional management support is needed unless otherwise documented below in the visit note. 

## 2016-05-17 NOTE — Patient Instructions (Addendum)
Go back to your dentist first  Then if they cannot find the cause of your pain we will refer you to ENT  Call your insurance and find out if the tetnaus shot and then shingles shot are covered in the office OR in the pharmacy or both  Let us know and we will get it organized   We will sign you up for the cologuard program   Take care of yourself  Labs are stable   Stay as active as you can as long as you are careful not to fall

## 2016-05-31 ENCOUNTER — Encounter: Payer: Self-pay | Admitting: Family Medicine

## 2016-06-01 LAB — COLOGUARD

## 2016-06-04 ENCOUNTER — Telehealth: Payer: Self-pay | Admitting: Family Medicine

## 2016-06-04 DIAGNOSIS — J3489 Other specified disorders of nose and nasal sinuses: Secondary | ICD-10-CM

## 2016-06-04 NOTE — Telephone Encounter (Signed)
Referral done  Will route to [facility]  Please note she pref not to see Dr Wilburn Cornelia

## 2016-06-04 NOTE — Telephone Encounter (Signed)
Patient said saw Dr.Tower about her sinuses.  Patient was told to check with her dentist first and if it's not her teeth, to call back and Dr.Tower would refer her.  Patient said she went to the dentist and had her tooth removed and a bridge put in 2 months ago and she still has the thumping sensation and burning in the cheekbone under her left eye.  Patient would like to be referred, but not to Dr.Shoemaker.  Patient would like to go to [place] and she can go anytime.

## 2016-06-07 LAB — COLOGUARD: COLOGUARD: NEGATIVE

## 2016-06-08 NOTE — Telephone Encounter (Signed)
Appt made and patient aware.  

## 2016-06-09 ENCOUNTER — Other Ambulatory Visit: Payer: Self-pay | Admitting: Otolaryngology

## 2016-06-09 DIAGNOSIS — J329 Chronic sinusitis, unspecified: Secondary | ICD-10-CM

## 2016-06-11 ENCOUNTER — Ambulatory Visit
Admission: RE | Admit: 2016-06-11 | Discharge: 2016-06-11 | Disposition: A | Discharge status: Home / Self Care | Payer: Medicare Other | Source: Ambulatory Visit | Attending: Otolaryngology | Admitting: Otolaryngology

## 2016-06-11 DIAGNOSIS — J329 Chronic sinusitis, unspecified: Secondary | ICD-10-CM

## 2016-06-17 ENCOUNTER — Encounter: Payer: Self-pay | Admitting: *Deleted

## 2016-06-21 ENCOUNTER — Ambulatory Visit (INDEPENDENT_AMBULATORY_CARE_PROVIDER_SITE_OTHER): Payer: Medicare Other | Admitting: Family Medicine

## 2016-06-21 ENCOUNTER — Encounter: Payer: Self-pay | Admitting: Family Medicine

## 2016-06-21 VITALS — BP 118/64 | HR 68 | Temp 98.4°F | Ht 65.5 in | Wt 171.5 lb

## 2016-06-21 DIAGNOSIS — R8290 Unspecified abnormal findings in urine: Secondary | ICD-10-CM | POA: Diagnosis not present

## 2016-06-21 DIAGNOSIS — R6884 Jaw pain: Secondary | ICD-10-CM | POA: Diagnosis not present

## 2016-06-21 DIAGNOSIS — R509 Fever, unspecified: Secondary | ICD-10-CM | POA: Diagnosis not present

## 2016-06-21 LAB — CBC WITH DIFFERENTIAL/PLATELET
Basophils Absolute: 0.1 10*3/uL (ref 0.0–0.1)
Basophils Relative: 1.5 % (ref 0.0–3.0)
EOS PCT: 2.5 % (ref 0.0–5.0)
Eosinophils Absolute: 0.2 10*3/uL (ref 0.0–0.7)
HEMATOCRIT: 39.9 % (ref 36.0–46.0)
Hemoglobin: 13.5 g/dL (ref 12.0–15.0)
LYMPHS ABS: 1.8 10*3/uL (ref 0.7–4.0)
LYMPHS PCT: 24.6 % (ref 12.0–46.0)
MCHC: 33.7 g/dL (ref 30.0–36.0)
MCV: 92.1 fl (ref 78.0–100.0)
MONOS PCT: 7.2 % (ref 3.0–12.0)
Monocytes Absolute: 0.5 10*3/uL (ref 0.1–1.0)
NEUTROS ABS: 4.6 10*3/uL (ref 1.4–7.7)
NEUTROS PCT: 64.2 % (ref 43.0–77.0)
PLATELETS: 266 10*3/uL (ref 150.0–400.0)
RBC: 4.34 Mil/uL (ref 3.87–5.11)
RDW: 13.3 % (ref 11.5–15.5)
WBC: 7.2 10*3/uL (ref 4.0–10.5)

## 2016-06-21 LAB — POC URINALSYSI DIPSTICK (AUTOMATED)
BILIRUBIN UA: NEGATIVE
Glucose, UA: NEGATIVE
KETONES UA: NEGATIVE
NITRITE UA: NEGATIVE
PH UA: 7.5
RBC UA: NEGATIVE
Spec Grav, UA: 1.015
Urobilinogen, UA: 0.2

## 2016-06-21 NOTE — Patient Instructions (Addendum)
Lab for CBC and urinalysis today  Make an appointment to see Dr Sue Lush about the facial pain  Your sinus scan was re assuring   If symptoms suddenly worsen - let me know  Get a thermometer and start keeping track of temperature  Normal is 98.6

## 2016-06-21 NOTE — Assessment & Plan Note (Signed)
Self reported fever symptoms in the evenings- enc pt to get a thermometer and start tracking ? If related to facial pain/ poss dental cause  ua and CBC as well today and update

## 2016-06-21 NOTE — Assessment & Plan Note (Signed)
Facial pain on the L persists in area of previous dental work  Rev sinus CT/ and pt has seen ENT for eval-no sinusitis Now some symptoms of fever/chills at night  Recommend she return to her endodontist for another look over  Denies temple pain or TMJ pain currently
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
## 2016-06-21 NOTE — Progress Notes (Signed)
Pre visit review using our clinic review tool, if applicable. No additional management support is needed unless otherwise documented below in the visit note. 

## 2016-06-22 ENCOUNTER — Telehealth: Payer: Self-pay | Admitting: Family Medicine

## 2016-06-22 LAB — URINE CULTURE

## 2016-06-22 NOTE — Telephone Encounter (Signed)
Addressed through result notes  

## 2016-06-22 NOTE — Telephone Encounter (Signed)
Patient returned Shapale's call. °

## 2017-01-14 ENCOUNTER — Telehealth: Payer: Self-pay | Admitting: Family Medicine

## 2017-01-14 NOTE — Telephone Encounter (Signed)
Form mailed to pt.

## 2017-01-14 NOTE — Telephone Encounter (Signed)
Done and in IN box 

## 2017-01-14 NOTE — Telephone Encounter (Signed)
Best number [phone]  Pt dropped off disability parking placad Please mail to pt when ready  Please call at let pt know In dr tower's in box

## 2017-01-17 ENCOUNTER — Encounter: Payer: Self-pay | Admitting: Family Medicine

## 2017-01-17 ENCOUNTER — Ambulatory Visit (INDEPENDENT_AMBULATORY_CARE_PROVIDER_SITE_OTHER): Payer: Medicare Other | Admitting: Family Medicine

## 2017-01-17 VITALS — BP 124/64 | HR 74 | Temp 98.6°F | Wt 170.5 lb

## 2017-01-17 DIAGNOSIS — Z23 Encounter for immunization: Secondary | ICD-10-CM | POA: Diagnosis not present

## 2017-01-17 DIAGNOSIS — N950 Postmenopausal bleeding: Secondary | ICD-10-CM | POA: Diagnosis not present

## 2017-01-17 NOTE — Patient Instructions (Signed)
We will refer you to gyn  If you develop cramping or heavier bleeding please let me know

## 2017-01-17 NOTE — Assessment & Plan Note (Signed)
Exam done- bleeding is vaginal without other findings  No cervical polyp seen  Ref to gyn for eval/tx of post menopausal bleeding
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
## 2017-01-27 ENCOUNTER — Encounter: Payer: Self-pay | Admitting: Internal Medicine

## 2017-02-01 ENCOUNTER — Other Ambulatory Visit: Payer: Self-pay | Admitting: Family Medicine

## 2017-02-01 DIAGNOSIS — Z1231 Encounter for screening mammogram for malignant neoplasm of breast: Secondary | ICD-10-CM

## 2017-02-04 ENCOUNTER — Telehealth: Payer: Self-pay

## 2017-02-04 NOTE — Telephone Encounter (Signed)
[employer] left v/m requesting cb to see if received clarification of dx packet sent on 01/31/17 or 02/03/17.

## 2017-02-04 NOTE — Telephone Encounter (Signed)
Done and in IN box 

## 2017-02-07 NOTE — Telephone Encounter (Signed)
Left message on voicemail that information was faxed back as requested.

## 2017-02-07 NOTE — Telephone Encounter (Signed)
Completed form and paper work faxed back as instructed.

## 2017-02-16 ENCOUNTER — Encounter: Payer: Self-pay | Admitting: Gynecologic Oncology

## 2017-02-16 ENCOUNTER — Ambulatory Visit: Payer: Medicare Other | Attending: Gynecologic Oncology | Admitting: Gynecologic Oncology

## 2017-02-16 VITALS — BP 153/73 | HR 80 | Temp 98.2°F | Resp 18 | Ht 67.0 in | Wt 177.4 lb

## 2017-02-16 DIAGNOSIS — Z79899 Other long term (current) drug therapy: Secondary | ICD-10-CM | POA: Insufficient documentation

## 2017-02-16 DIAGNOSIS — Z981 Arthrodesis status: Secondary | ICD-10-CM | POA: Insufficient documentation

## 2017-02-16 DIAGNOSIS — Z170 Estrogen receptor positive status [ER+]: Secondary | ICD-10-CM | POA: Diagnosis not present

## 2017-02-16 DIAGNOSIS — Z8542 Personal history of malignant neoplasm of other parts of uterus: Secondary | ICD-10-CM | POA: Insufficient documentation

## 2017-02-16 DIAGNOSIS — F329 Major depressive disorder, single episode, unspecified: Secondary | ICD-10-CM | POA: Diagnosis not present

## 2017-02-16 DIAGNOSIS — C541 Malignant neoplasm of endometrium: Secondary | ICD-10-CM | POA: Diagnosis present

## 2017-02-16 DIAGNOSIS — M542 Cervicalgia: Secondary | ICD-10-CM | POA: Diagnosis not present

## 2017-02-16 DIAGNOSIS — K219 Gastro-esophageal reflux disease without esophagitis: Secondary | ICD-10-CM | POA: Insufficient documentation

## 2017-02-16 DIAGNOSIS — M1990 Unspecified osteoarthritis, unspecified site: Secondary | ICD-10-CM | POA: Diagnosis not present

## 2017-02-16 DIAGNOSIS — R200 Anesthesia of skin: Secondary | ICD-10-CM | POA: Diagnosis not present
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
## 2017-02-16 NOTE — Patient Instructions (Signed)
Plan on having a CT scan of the abdomen and pelvis and we will contact you with the results.  You will receive a phone call from [facility] with a date and time for surgery.  Please call for any questions or concerns.

## 2017-02-18 ENCOUNTER — Telehealth: Payer: Self-pay | Admitting: *Deleted

## 2017-02-18 NOTE — Telephone Encounter (Signed)
Faxed Dr. Alycia Rossetti office note from 10/24 to [place] at [facility]

## 2017-02-21 ENCOUNTER — Ambulatory Visit (HOSPITAL_COMMUNITY)
Admission: RE | Admit: 2017-02-21 | Discharge: 2017-02-21 | Disposition: A | Discharge status: Home / Self Care | Payer: Medicare Other | Source: Ambulatory Visit | Attending: Gynecologic Oncology | Admitting: Gynecologic Oncology

## 2017-02-21 DIAGNOSIS — R911 Solitary pulmonary nodule: Secondary | ICD-10-CM | POA: Insufficient documentation

## 2017-02-21 DIAGNOSIS — C541 Malignant neoplasm of endometrium: Secondary | ICD-10-CM | POA: Insufficient documentation

## 2017-02-21 LAB — POCT I-STAT CREATININE: Creatinine, Ser: 0.9 mg/dL (ref 0.44–1.00)

## 2017-02-21 MED ORDER — IOPAMIDOL (ISOVUE-300) INJECTION 61%
INTRAVENOUS | Status: AC
  Administered 2017-02-21: 100 mL via INTRAVENOUS
  Filled 2017-02-21: qty 100

## 2017-02-22 ENCOUNTER — Telehealth: Payer: Self-pay | Admitting: Gynecologic Oncology

## 2017-02-22 NOTE — Telephone Encounter (Signed)
Patient informed of CT scan results.  No concerns voiced.  Advised that a pulmonary nodule was seen and would need follow up.  Advised to call for any needs.

## 2017-02-25 ENCOUNTER — Ambulatory Visit
Admission: RE | Admit: 2017-02-25 | Discharge: 2017-02-25 | Disposition: A | Discharge status: Home / Self Care | Payer: Medicare Other | Source: Ambulatory Visit | Attending: Family Medicine | Admitting: Family Medicine

## 2017-02-25 DIAGNOSIS — Z1231 Encounter for screening mammogram for malignant neoplasm of breast: Secondary | ICD-10-CM

## 2017-03-04 HISTORY — PX: ROBOTIC ASSISTED TOTAL HYSTERECTOMY WITH BILATERAL SALPINGO OOPHERECTOMY: SHX6086

## 2017-03-07 ENCOUNTER — Telehealth: Payer: Self-pay | Admitting: *Deleted

## 2017-03-07 NOTE — Telephone Encounter (Signed)
Called and left the patient a message for her post op appt. Appt scheduled for November 28th at 11:45am. Ask the patient to call the office back to confirm appt.
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
## 2017-03-23 ENCOUNTER — Encounter: Payer: Self-pay | Admitting: Radiation Oncology

## 2017-03-23 ENCOUNTER — Encounter: Payer: Self-pay | Admitting: Gynecologic Oncology

## 2017-03-23 ENCOUNTER — Ambulatory Visit: Payer: Medicare Other | Attending: Gynecologic Oncology | Admitting: Gynecologic Oncology

## 2017-03-23 VITALS — BP 136/64 | HR 86 | Temp 97.6°F | Resp 20 | Ht 68.0 in | Wt 170.8 lb

## 2017-03-23 DIAGNOSIS — R200 Anesthesia of skin: Secondary | ICD-10-CM | POA: Diagnosis not present

## 2017-03-23 DIAGNOSIS — M1990 Unspecified osteoarthritis, unspecified site: Secondary | ICD-10-CM | POA: Insufficient documentation

## 2017-03-23 DIAGNOSIS — Z79899 Other long term (current) drug therapy: Secondary | ICD-10-CM | POA: Diagnosis not present

## 2017-03-23 DIAGNOSIS — F329 Major depressive disorder, single episode, unspecified: Secondary | ICD-10-CM | POA: Insufficient documentation

## 2017-03-23 DIAGNOSIS — Z7982 Long term (current) use of aspirin: Secondary | ICD-10-CM | POA: Diagnosis not present

## 2017-03-23 DIAGNOSIS — K219 Gastro-esophageal reflux disease without esophagitis: Secondary | ICD-10-CM | POA: Insufficient documentation

## 2017-03-23 DIAGNOSIS — C541 Malignant neoplasm of endometrium: Secondary | ICD-10-CM | POA: Insufficient documentation

## 2017-03-23 NOTE — Patient Instructions (Addendum)
Plan on meeting with Dr. Gery Pray in Radiation Oncology and Dr. Heath Lark in Medical Oncology.  You will see Dr. Alycia Rossetti at the completion of your treatments.  Please call for any questions or concerns.  Your appointment with Dr. Gery Pray here at the [facility] will be on March 30, 2017 at 9:30 am with arrival at 9:15am to get checked in.

## 2017-03-24 ENCOUNTER — Telehealth: Payer: Self-pay | Admitting: *Deleted

## 2017-03-24 NOTE — Telephone Encounter (Signed)
Called and left the patient a message to call the office back tomorrow. Patient needs to be scheduled to see Dr. Alvy Bimler.

## 2017-03-25 ENCOUNTER — Encounter: Payer: Self-pay | Admitting: Radiation Oncology

## 2017-03-25 ENCOUNTER — Telehealth: Payer: Self-pay | Admitting: *Deleted

## 2017-03-25 NOTE — Telephone Encounter (Signed)
Patient returned call and was given new patient appt with Dr. Alvy Bimler on Dec 5th at 11:30am per staff message

## 2017-03-25 NOTE — Progress Notes (Signed)
GYN Location of Tumor / Histology: Endometrial ca   Cindy Davies presented with symptoms of: who 2 years ago had an endometrial polyp removed for PMP bleeding. Her bleeding stopped until about a month ago when she started having intermittent bleeding.   Biopsies revealed:   03/08/17 A: Sentinel lymph node, right presacral, removal  - One lymph node negative for metastatic carcinoma (0/1), H&E and cytokeratin stained sections  B: Sentinel lymph node, right para-aortic, removal  - One lymph node negative for metastatic carcinoma (0/1), H&E and cytokeratin stained sections  C: Sentinel lymph node, left obturator, removal  - One lymph node negative for metastatic carcinoma (0/1), H&E and cytokeratin stained sections  D: Uterus, cervix, bilateral tubes and ovaries, hysterectomy and bilateral salpingo-oophorectomy Endometrium - High grade adenocarcinoma, most consistent with serous carcinoma (please see comment and synoptic report)  Additional pathologic findings: - Cervix:  Chronic cervicitis with endocervical glandular dilatation, tunnel cluster, and reactive epithelial changes - Myometrium:  Leiomyoma measuring 8 mm in greatest dimension; adenomyosis - Right ovary:  Epithelial inclusion cysts with associated calcifications and physiologic changes - Right fallopian tube:  No specific pathologic abnormality - Left ovary:  Fibroma measuring 4 mm; hilar cell hyperplasia; physiologic changes - Left fallopian tube:  No specific pathologic abnormality  Past/Anticipated interventions by Gyn/Onc surgery, if any: 03/04/2017 - Procedure(s): Total robotic hysterectomy, bilateral salpingo-oophorectomy, sentinel lymph node biopsy   Past/Anticipated interventions by medical oncology, if any: Dr. Alycia Rossetti is recommending single agent carboplatin.  She will see Dr. Alvy Bimler today.  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  Nausea/Vomiting, if any: no  Pain issues, if any: no  SAFETY ISSUES:  Prior radiation? no  Pacemaker/ICD? no  Possible current pregnancy? no  Is the patient on methotrexate? no  Current Complaints / other details:  Dr. Alycia Rossetti is recommending vaginal cuff brachytherapy if patient will undergo chemotherapy.  BP 136/68 (BP Location: Right Arm, Patient Position: Sitting)   Pulse 79   Temp 98.3 F (36.8 C) (Oral)   Ht 5\' 8"  (1.727 m)   Wt 170 lb (77.1 kg)   SpO2 100%   BMI 25.85 kg/m    Wt Readings from Last 3 Encounters:  03/30/17 170 lb (77.1 kg)  03/23/17 170 lb 12.8 oz (77.5 kg)  02/16/17 177 lb 6.4 oz (80.5 kg)

## 2017-03-30 ENCOUNTER — Other Ambulatory Visit: Payer: Self-pay

## 2017-03-30 ENCOUNTER — Telehealth: Payer: Self-pay | Admitting: Hematology and Oncology

## 2017-03-30 ENCOUNTER — Ambulatory Visit
Admission: RE | Admit: 2017-03-30 | Discharge: 2017-03-30 | Disposition: A | Discharge status: Home / Self Care | Payer: Medicare Other | Source: Ambulatory Visit | Attending: Radiation Oncology | Admitting: Radiation Oncology

## 2017-03-30 ENCOUNTER — Ambulatory Visit (HOSPITAL_BASED_OUTPATIENT_CLINIC_OR_DEPARTMENT_OTHER): Payer: Medicare Other | Admitting: Hematology and Oncology

## 2017-03-30 ENCOUNTER — Encounter: Payer: Self-pay | Admitting: Hematology and Oncology

## 2017-03-30 ENCOUNTER — Encounter: Payer: Self-pay | Admitting: Radiation Oncology

## 2017-03-30 VITALS — BP 125/56 | HR 81 | Temp 98.1°F | Resp 18 | Ht 68.0 in | Wt 171.1 lb

## 2017-03-30 VITALS — BP 136/68 | HR 79 | Temp 98.3°F | Ht 68.0 in | Wt 170.0 lb

## 2017-03-30 DIAGNOSIS — C541 Malignant neoplasm of endometrium: Secondary | ICD-10-CM | POA: Insufficient documentation

## 2017-03-30 DIAGNOSIS — Z90710 Acquired absence of both cervix and uterus: Secondary | ICD-10-CM | POA: Diagnosis not present

## 2017-03-30 DIAGNOSIS — Z79899 Other long term (current) drug therapy: Secondary | ICD-10-CM | POA: Diagnosis not present

## 2017-03-30 DIAGNOSIS — K219 Gastro-esophageal reflux disease without esophagitis: Secondary | ICD-10-CM | POA: Insufficient documentation

## 2017-03-30 DIAGNOSIS — Z801 Family history of malignant neoplasm of trachea, bronchus and lung: Secondary | ICD-10-CM | POA: Diagnosis not present

## 2017-03-30 DIAGNOSIS — M1990 Unspecified osteoarthritis, unspecified site: Secondary | ICD-10-CM | POA: Diagnosis not present

## 2017-03-30 DIAGNOSIS — F329 Major depressive disorder, single episode, unspecified: Secondary | ICD-10-CM | POA: Insufficient documentation

## 2017-03-30 DIAGNOSIS — Z808 Family history of malignant neoplasm of other organs or systems: Secondary | ICD-10-CM

## 2017-03-30 DIAGNOSIS — Z90722 Acquired absence of ovaries, bilateral: Secondary | ICD-10-CM | POA: Insufficient documentation

## 2017-03-30 DIAGNOSIS — Z7982 Long term (current) use of aspirin: Secondary | ICD-10-CM | POA: Diagnosis not present

## 2017-03-30 DIAGNOSIS — Z800 Family history of malignant neoplasm of digestive organs: Secondary | ICD-10-CM | POA: Diagnosis not present

## 2017-03-30 DIAGNOSIS — R202 Paresthesia of skin: Secondary | ICD-10-CM | POA: Diagnosis not present

## 2017-03-30 DIAGNOSIS — Z981 Arthrodesis status: Secondary | ICD-10-CM | POA: Insufficient documentation

## 2017-03-30 DIAGNOSIS — Z809 Family history of malignant neoplasm, unspecified: Secondary | ICD-10-CM | POA: Diagnosis not present

## 2017-03-30 DIAGNOSIS — Z8249 Family history of ischemic heart disease and other diseases of the circulatory system: Secondary | ICD-10-CM | POA: Diagnosis not present

## 2017-03-30 HISTORY — DX: Malignant neoplasm of endometrium: C54.1

## 2017-03-30 MED ORDER — LIDOCAINE-PRILOCAINE 2.5-2.5 % EX CREA: TOPICAL_CREAM | CUTANEOUS | 3 refills | Status: DC

## 2017-03-30 MED ORDER — PROCHLORPERAZINE MALEATE 10 MG PO TABS: 10.0000 mg | ORAL_TABLET | Freq: Four times a day (QID) | ORAL | 1 refills | Status: DC | PRN

## 2017-03-30 MED ORDER — ONDANSETRON HCL 8 MG PO TABS: 8.0000 mg | ORAL_TABLET | Freq: Two times a day (BID) | ORAL | 1 refills | Status: DC | PRN

## 2017-03-30 NOTE — Telephone Encounter (Signed)
Scheduled appt for treatment 12/13 left message with appt date and time.

## 2017-03-30 NOTE — Assessment & Plan Note (Addendum)
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

## 2017-03-30 NOTE — Telephone Encounter (Signed)
Scheduled appt per 12/5 los - unable to schedule 12/13 appt due to cap - patient aware they will be contacted when scheduled and approved by Rn Director.
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
## 2017-03-30 NOTE — Progress Notes (Signed)
START ON PATHWAY REGIMEN - Uterine     A cycle is every 21 days:     Paclitaxel      Carboplatin   **Always confirm dose/schedule in your pharmacy ordering system**    Patient Characteristics: Papillary Serous and Clear Cell Histology, First Line, Medically Operable AJCC T Category: T1 AJCC N Category: N0 AJCC M Category: M0 AJCC 8 Stage Grouping: I Would you be surprised if this patient died  in the next year<= I would be surprised if this patient died in the next year Line of therapy: First Line Patient Status: Medically Operable Intent of Therapy: Curative Intent, Discussed with Patient

## 2017-03-30 NOTE — Assessment & Plan Note (Signed)
The patient had a spinal cord injury many years ago She has baseline paresthesia affecting her lower extremities We discussed potential risk of worsening neuropathy with Taxol and ultimately she agreed to not pursue chemotherapy with Taxol but stay with carboplatin only which I think is reasonable
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
## 2017-03-31 ENCOUNTER — Other Ambulatory Visit: Payer: Self-pay | Admitting: Radiology

## 2017-03-31 ENCOUNTER — Other Ambulatory Visit: Payer: Self-pay | Admitting: General Surgery

## 2017-04-01 ENCOUNTER — Other Ambulatory Visit: Payer: Self-pay | Admitting: Hematology and Oncology

## 2017-04-01 ENCOUNTER — Ambulatory Visit (HOSPITAL_COMMUNITY)
Admission: RE | Admit: 2017-04-01 | Discharge: 2017-04-01 | Disposition: A | Discharge status: Home / Self Care | Payer: Medicare Other | Source: Ambulatory Visit | Attending: Hematology and Oncology | Admitting: Hematology and Oncology

## 2017-04-01 ENCOUNTER — Encounter (HOSPITAL_COMMUNITY): Payer: Self-pay

## 2017-04-01 DIAGNOSIS — K219 Gastro-esophageal reflux disease without esophagitis: Secondary | ICD-10-CM | POA: Diagnosis not present

## 2017-04-01 DIAGNOSIS — M1990 Unspecified osteoarthritis, unspecified site: Secondary | ICD-10-CM | POA: Insufficient documentation

## 2017-04-01 DIAGNOSIS — F329 Major depressive disorder, single episode, unspecified: Secondary | ICD-10-CM | POA: Insufficient documentation

## 2017-04-01 DIAGNOSIS — C541 Malignant neoplasm of endometrium: Secondary | ICD-10-CM | POA: Diagnosis not present

## 2017-04-01 HISTORY — PX: IR FLUORO GUIDE PORT INSERTION RIGHT: IMG5741

## 2017-04-01 HISTORY — PX: IR US GUIDE VASC ACCESS RIGHT: IMG2390

## 2017-04-01 LAB — COMPREHENSIVE METABOLIC PANEL
ALT: 18 U/L (ref 14–54)
ANION GAP: 9 (ref 5–15)
AST: 32 U/L (ref 15–41)
Albumin: 4.2 g/dL (ref 3.5–5.0)
Alkaline Phosphatase: 47 U/L (ref 38–126)
BUN: 17 mg/dL (ref 6–20)
CALCIUM: 9.5 mg/dL (ref 8.9–10.3)
CHLORIDE: 106 mmol/L (ref 101–111)
CO2: 25 mmol/L (ref 22–32)
Creatinine, Ser: 0.91 mg/dL (ref 0.44–1.00)
GFR, EST NON AFRICAN AMERICAN: 58 mL/min — AB (ref >60)
Glucose, Bld: 105 mg/dL — ABNORMAL HIGH (ref 65–99)
Potassium: 4.3 mmol/L (ref 3.5–5.1)
SODIUM: 140 mmol/L (ref 135–145)
Total Bilirubin: 0.3 mg/dL (ref 0.3–1.2)
Total Protein: 7.5 g/dL (ref 6.5–8.1)

## 2017-04-01 LAB — CBC WITH DIFFERENTIAL/PLATELET
BASOS PCT: 1 %
Basophils Absolute: 0.1 10*3/uL (ref 0.0–0.1)
EOS ABS: 0.4 10*3/uL (ref 0.0–0.7)
Eosinophils Relative: 6 %
HEMATOCRIT: 37.4 % (ref 36.0–46.0)
HEMOGLOBIN: 12.6 g/dL (ref 12.0–15.0)
LYMPHS ABS: 1.8 10*3/uL (ref 0.7–4.0)
Lymphocytes Relative: 26 %
MCH: 31.2 pg (ref 26.0–34.0)
MCHC: 33.7 g/dL (ref 30.0–36.0)
MCV: 92.6 fL (ref 78.0–100.0)
MONOS PCT: 7 %
Monocytes Absolute: 0.5 10*3/uL (ref 0.1–1.0)
NEUTROS ABS: 4.1 10*3/uL (ref 1.7–7.7)
NEUTROS PCT: 60 %
Platelets: 314 10*3/uL (ref 150–400)
RBC: 4.04 MIL/uL (ref 3.87–5.11)
RDW: 13.6 % (ref 11.5–15.5)
WBC: 6.9 10*3/uL (ref 4.0–10.5)

## 2017-04-01 LAB — PROTIME-INR
INR: 0.94
PROTHROMBIN TIME: 12.5 s (ref 11.4–15.2)

## 2017-04-01 MED ORDER — LIDOCAINE-EPINEPHRINE (PF) 2 %-1:200000 IJ SOLN
INTRAMUSCULAR | Status: DC
Start: 2017-04-01 — End: 2017-04-02
  Filled 2017-04-01: qty 20

## 2017-04-01 MED ORDER — FENTANYL CITRATE (PF) 100 MCG/2ML IJ SOLN
INTRAMUSCULAR | Status: AC
  Filled 2017-04-01: qty 2

## 2017-04-01 MED ORDER — CEFAZOLIN SODIUM-DEXTROSE 2-4 GM/100ML-% IV SOLN
INTRAVENOUS | Status: AC
  Administered 2017-04-01: 2000 mg via INTRAVENOUS
  Filled 2017-04-01: qty 100

## 2017-04-01 MED ORDER — CEFAZOLIN SODIUM-DEXTROSE 2-4 GM/100ML-% IV SOLN
2.0000 g | INTRAVENOUS | Status: AC
  Administered 2017-04-01: 2000 mg via INTRAVENOUS

## 2017-04-01 MED ORDER — MIDAZOLAM HCL 2 MG/2ML IJ SOLN
INTRAMUSCULAR | Status: AC | PRN
  Administered 2017-04-01: 1 mg via INTRAVENOUS

## 2017-04-01 MED ORDER — LIDOCAINE HCL (PF) 1 % IJ SOLN
INTRAMUSCULAR | Status: AC | PRN
  Administered 2017-04-01: 10 mL

## 2017-04-01 MED ORDER — MIDAZOLAM HCL 2 MG/2ML IJ SOLN
INTRAMUSCULAR | Status: AC
  Filled 2017-04-01: qty 2

## 2017-04-01 MED ORDER — SODIUM CHLORIDE 0.9 % IV SOLN: INTRAVENOUS | Status: AC | PRN

## 2017-04-01 MED ORDER — FENTANYL CITRATE (PF) 100 MCG/2ML IJ SOLN
INTRAMUSCULAR | Status: AC | PRN
Start: 2017-04-01 — End: 2017-04-01
  Administered 2017-04-01: 50 ug via INTRAVENOUS

## 2017-04-01 MED ORDER — SODIUM CHLORIDE 0.9 % IV SOLN
INTRAVENOUS | Status: DC
  Administered 2017-04-01: 10:00:00 via INTRAVENOUS

## 2017-04-01 MED ORDER — HEPARIN SOD (PORK) LOCK FLUSH 100 UNIT/ML IV SOLN
INTRAVENOUS | Status: AC | PRN
  Administered 2017-04-01: 500 [IU] via INTRAVENOUS

## 2017-04-01 MED ORDER — LIDOCAINE HCL (PF) 1 % IJ SOLN
INTRAMUSCULAR | Status: AC
  Filled 2017-04-01: qty 30

## 2017-04-01 MED ORDER — LIDOCAINE-EPINEPHRINE (PF) 2 %-1:200000 IJ SOLN
INTRAMUSCULAR | Status: AC | PRN
  Administered 2017-04-01: 10 mL

## 2017-04-01 MED ORDER — HEPARIN SOD (PORK) LOCK FLUSH 100 UNIT/ML IV SOLN
INTRAVENOUS | Status: AC
  Filled 2017-04-01: qty 5

## 2017-04-01 NOTE — Sedation Documentation (Signed)
Patient denies pain and is resting comfortably.  

## 2017-04-01 NOTE — Sedation Documentation (Signed)
Patient is resting comfortably. 

## 2017-04-01 NOTE — Progress Notes (Signed)
Saverio Danker, PA notified patient drank 1/2 cup black coffee at 07:30 this morning.  No orders given.

## 2017-04-01 NOTE — Discharge Instructions (Signed)
Moderate Conscious Sedation, Adult, Care After These instructions provide you with information about caring for yourself after your procedure. Your health care provider may also give you more specific instructions. Your treatment has been planned according to current medical practices, but problems sometimes occur. Call your health care provider if you have any problems or questions after your procedure. What can I expect after the procedure? After your procedure, it is common:  To feel sleepy for several hours.  To feel clumsy and have poor balance for several hours.  To have poor judgment for several hours.  To vomit if you eat too soon.  Follow these instructions at home: For at least 24 hours after the procedure:   Do not: ? Participate in activities where you could fall or become injured. ? Drive. ? Use heavy machinery. ? Drink alcohol. ? Take sleeping pills or medicines that cause drowsiness. ? Make important decisions or sign legal documents. ? Take care of children on your own.  Rest. Eating and drinking  Follow the diet recommended by your health care provider.  If you vomit: ? Drink water, juice, or soup when you can drink without vomiting. ? Make sure you have little or no nausea before eating solid foods. General instructions  Have a responsible adult stay with you until you are awake and alert.  Take over-the-counter and prescription medicines only as told by your health care provider.  If you smoke, do not smoke without supervision.  Keep all follow-up visits as told by your health care provider. This is important. Contact a health care provider if:  You keep feeling nauseous or you keep vomiting.  You feel light-headed.  You develop a rash.  You have a fever. Get help right away if:  You have trouble breathing. This information is not intended to replace advice given to you by your health care provider. Make sure you discuss any questions you have  with your health care provider. Document Released: 01/31/2013 Document Revised: 09/15/2015 Document Reviewed: 08/02/2015 Elsevier Interactive Patient Education  2018 [place] Insertion, Care After This sheet gives you information about how to care for yourself after your procedure. Your health care provider may also give you more specific instructions. If you have problems or questions, contact your health care provider. What can I expect after the procedure? After your procedure, it is common to have:  Discomfort at the port insertion site.  Bruising on the skin over the port. This should improve over 3-4 days.  Follow these instructions at home: [facility] care  After your port is placed, you will get a manufacturer's information card. The card has information about your port. Keep this card with you at all times.  Take care of the port as told by your health care provider. Ask your health care provider if you or a family member can get training for taking care of the port at home. A home health care nurse may also take care of the port.  Make sure to remember what type of port you have. Incision care  Follow instructions from your health care provider about how to take care of your port insertion site. Make sure you: ? Wash your hands with soap and water before you change your bandage (dressing). If soap and water are not available, use hand sanitizer. ? Change your dressing as told by your health care provider.  You may remove your dressing tomorrow. ? Leave skin glue, or adhesive strips in place.  These skin closures may need to stay in place for 2 weeks or longer. If adhesive strip edges start to loosen and curl up, you may trim the loose edges. Do not remove adhesive strips completely unless your health care provider tells you to do that.  DO NOT use EMLA cream for 2 weeks after port placement as this cream will remove surgical glue.  Check your port insertion site  every day for signs of infection. Check for: ? More redness, swelling, or pain. ? More fluid or blood. ? Warmth. ? Pus or a bad smell. General instructions  Do not take baths, swim, or use a hot tub until your health care provider approves.  You may shower tomorrow.  Do not lift anything that is heavier than 10 lb (4.5 kg) for a week, or as told by your health care provider.  Ask your health care provider when it is okay to: ? Return to work or school. ? Resume usual physical activities or sports.  Do not drive for 24 hours if you were given a medicine to help you relax (sedative).  Take over-the-counter and prescription medicines only as told by your health care provider.  Wear a medical alert bracelet in case of an emergency. This will tell any health care providers that you have a port.  Keep all follow-up visits as told by your health care provider. This is important. Contact a health care provider if:  You have a fever or chills.  You have more redness, swelling, or pain around your port insertion site.  You have more fluid or blood coming from your port insertion site.  Your port insertion site feels warm to the touch.  You have pus or a bad smell coming from the port insertion site. Get help right away if:  You have chest pain or shortness of breath.  You have bleeding from your port that you cannot control. Summary  Take care of the port as told by your health care provider.  Change your dressing as told by your health care provider.  Keep all follow-up visits as told by your health care provider. This information is not intended to replace advice given to you by your health care provider. Make sure you discuss any questions you have with your health care provider. Document Released: 01/31/2013 Document Revised: 03/03/2016 Document Reviewed: 03/03/2016 Elsevier Interactive Patient Education  2017 [employer].
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
## 2017-04-01 NOTE — Procedures (Signed)
Placement of right jugular port.  Tip at SVC/RA junction.  Minimal blood loss and no immediate complication.  

## 2017-04-05 ENCOUNTER — Other Ambulatory Visit: Payer: Medicare Other

## 2017-04-05 ENCOUNTER — Encounter: Payer: Medicare Other | Admitting: Hematology and Oncology

## 2017-04-05 DIAGNOSIS — C541 Malignant neoplasm of endometrium: Secondary | ICD-10-CM

## 2017-04-06 ENCOUNTER — Other Ambulatory Visit: Payer: Medicare Other

## 2017-04-06 ENCOUNTER — Encounter: Payer: Self-pay | Admitting: *Deleted

## 2017-04-06 ENCOUNTER — Ambulatory Visit (HOSPITAL_BASED_OUTPATIENT_CLINIC_OR_DEPARTMENT_OTHER): Payer: Medicare Other | Admitting: Hematology and Oncology

## 2017-04-06 DIAGNOSIS — C541 Malignant neoplasm of endometrium: Secondary | ICD-10-CM

## 2017-04-06 DIAGNOSIS — R202 Paresthesia of skin: Secondary | ICD-10-CM

## 2017-04-07 ENCOUNTER — Ambulatory Visit (HOSPITAL_BASED_OUTPATIENT_CLINIC_OR_DEPARTMENT_OTHER): Payer: Medicare Other

## 2017-04-07 VITALS — BP 98/52 | HR 87 | Temp 98.5°F | Resp 18

## 2017-04-07 DIAGNOSIS — C541 Malignant neoplasm of endometrium: Secondary | ICD-10-CM | POA: Diagnosis not present

## 2017-04-07 DIAGNOSIS — Z5111 Encounter for antineoplastic chemotherapy: Secondary | ICD-10-CM

## 2017-04-07 MED ORDER — PALONOSETRON HCL INJECTION 0.25 MG/5ML
0.2500 mg | Freq: Once | INTRAVENOUS | Status: AC
  Administered 2017-04-07: 0.25 mg via INTRAVENOUS

## 2017-04-07 MED ORDER — SODIUM CHLORIDE 0.9 % IV SOLN
Freq: Once | INTRAVENOUS | Status: AC
  Administered 2017-04-07: 08:00:00 via INTRAVENOUS

## 2017-04-07 MED ORDER — SODIUM CHLORIDE 0.9 % IV SOLN
400.0000 mg | Freq: Once | INTRAVENOUS | Status: AC
  Administered 2017-04-07: 400 mg via INTRAVENOUS
  Filled 2017-04-07: qty 40

## 2017-04-07 MED ORDER — PALONOSETRON HCL INJECTION 0.25 MG/5ML
INTRAVENOUS | Status: AC
  Filled 2017-04-07: qty 5

## 2017-04-07 MED ORDER — SODIUM CHLORIDE 0.9 % IV SOLN
Freq: Once | INTRAVENOUS | Status: AC
  Administered 2017-04-07: 09:00:00 via INTRAVENOUS
  Filled 2017-04-07: qty 5

## 2017-04-07 MED ORDER — SODIUM CHLORIDE 0.9% FLUSH
10.0000 mL | INTRAVENOUS | Status: DC | PRN
  Administered 2017-04-07: 10 mL
  Filled 2017-04-07: qty 10

## 2017-04-07 MED ORDER — HEPARIN SOD (PORK) LOCK FLUSH 100 UNIT/ML IV SOLN
500.0000 [IU] | Freq: Once | INTRAVENOUS | Status: AC | PRN
  Administered 2017-04-07: 500 [IU]
  Filled 2017-04-07: qty 5

## 2017-04-07 MED ORDER — SODIUM CHLORIDE 0.9 % IV SOLN
395.5000 mg | Freq: Once | INTRAVENOUS | Status: DC
  Filled 2017-04-07: qty 40

## 2017-04-07 NOTE — Progress Notes (Signed)
This encounter was created in error - please disregard.

## 2017-04-07 NOTE — Patient Instructions (Addendum)
[facility] Discharge Instructions for Patients Receiving Chemotherapy  Today you received the following chemotherapy agents Carboplatin To help prevent nausea and vomiting after your treatment, we encourage you to take your nausea medication as directed   If you develop nausea and vomiting that is not controlled by your nausea medication, call the clinic.   BELOW ARE SYMPTOMS THAT SHOULD BE REPORTED IMMEDIATELY:  *FEVER GREATER THAN 100.5 F  *CHILLS WITH OR WITHOUT FEVER  NAUSEA AND VOMITING THAT IS NOT CONTROLLED WITH YOUR NAUSEA MEDICATION  *UNUSUAL SHORTNESS OF BREATH  *UNUSUAL BRUISING OR BLEEDING  TENDERNESS IN MOUTH AND THROAT WITH OR WITHOUT PRESENCE OF ULCERS  *URINARY PROBLEMS  *BOWEL PROBLEMS  UNUSUAL RASH Items with * indicate a potential emergency and should be followed up as soon as possible.  Feel free to call the clinic should you have any questions or concerns. The clinic phone number is [phone].  Please show the CHEMO ALERT CARD at check-in to the Emergency Department and triage nurse.      Carboplatin injection What is this medicine? CARBOPLATIN (KAR boe pla tin) is a chemotherapy drug. It targets fast dividing cells, like cancer cells, and causes these cells to die. This medicine is used to treat ovarian cancer and many other cancers. This medicine may be used for other purposes; ask your health care provider or pharmacist if you have questions. COMMON BRAND NAME(S): Paraplatin What should I tell my health care provider before I take this medicine? They need to know if you have any of these conditions: -blood disorders -hearing problems -kidney disease -recent or ongoing radiation therapy -an unusual or allergic reaction to carboplatin, cisplatin, other chemotherapy, other medicines, foods, dyes, or preservatives -pregnant or trying to get pregnant -breast-feeding How should I use this medicine? This drug is usually given as  an infusion into a vein. It is administered in a hospital or clinic by a specially trained health care professional. Talk to your pediatrician regarding the use of this medicine in children. Special care may be needed. Overdosage: If you think you have taken too much of this medicine contact a poison control center or emergency room at once. NOTE: This medicine is only for you. Do not share this medicine with others. What if I miss a dose? It is important not to miss a dose. Call your doctor or health care professional if you are unable to keep an appointment. What may interact with this medicine? -medicines for seizures -medicines to increase blood counts like filgrastim, pegfilgrastim, sargramostim -some antibiotics like amikacin, gentamicin, neomycin, streptomycin, tobramycin -vaccines Talk to your doctor or health care professional before taking any of these medicines: -acetaminophen -aspirin -ibuprofen -ketoprofen -naproxen This list may not describe all possible interactions. Give your health care provider a list of all the medicines, herbs, non-prescription drugs, or dietary supplements you use. Also tell them if you smoke, drink alcohol, or use illegal drugs. Some items may interact with your medicine. What should I watch for while using this medicine? Your condition will be monitored carefully while you are receiving this medicine. You will need important blood work done while you are taking this medicine. This drug may make you feel generally unwell. This is not uncommon, as chemotherapy can affect healthy cells as well as cancer cells. Report any side effects. Continue your course of treatment even though you feel ill unless your doctor tells you to stop. In some cases, you may be given additional medicines to help with side   effects. Follow all directions for their use. Call your doctor or health care professional for advice if you get a fever, chills or sore throat, or other  symptoms of a cold or flu. Do not treat yourself. This drug decreases your body's ability to fight infections. Try to avoid being around people who are sick. This medicine may increase your risk to bruise or bleed. Call your doctor or health care professional if you notice any unusual bleeding. Be careful brushing and flossing your teeth or using a toothpick because you may get an infection or bleed more easily. If you have any dental work done, tell your dentist you are receiving this medicine. Avoid taking products that contain aspirin, acetaminophen, ibuprofen, naproxen, or ketoprofen unless instructed by your doctor. These medicines may hide a fever. Do not become pregnant while taking this medicine. Women should inform their doctor if they wish to become pregnant or think they might be pregnant. There is a potential for serious side effects to an unborn child. Talk to your health care professional or pharmacist for more information. Do not breast-feed an infant while taking this medicine. What side effects may I notice from receiving this medicine? Side effects that you should report to your doctor or health care professional as soon as possible: -allergic reactions like skin rash, itching or hives, swelling of the face, lips, or tongue -signs of infection - fever or chills, cough, sore throat, pain or difficulty passing urine -signs of decreased platelets or bleeding - bruising, pinpoint red spots on the skin, black, tarry stools, nosebleeds -signs of decreased red blood cells - unusually weak or tired, fainting spells, lightheadedness -breathing problems -changes in hearing -changes in vision -chest pain -high blood pressure -low blood counts - This drug may decrease the number of white blood cells, red blood cells and platelets. You may be at increased risk for infections and bleeding. -nausea and vomiting -pain, swelling, redness or irritation at the injection site -pain, tingling,  numbness in the hands or feet -problems with balance, talking, walking -trouble passing urine or change in the amount of urine Side effects that usually do not require medical attention (report to your doctor or health care professional if they continue or are bothersome): -hair loss -loss of appetite -metallic taste in the mouth or changes in taste This list may not describe all possible side effects. Call your doctor for medical advice about side effects. You may report side effects to FDA at 1-800-FDA-1088. Where should I keep my medicine? This drug is given in a hospital or clinic and will not be stored at home. NOTE: This sheet is a summary. It may not cover all possible information. If you have questions about this medicine, talk to your doctor, pharmacist, or health care provider.  2018 [employer] Standard (2007-07-18 14:38:05)     

## 2017-04-08 ENCOUNTER — Telehealth: Payer: Self-pay | Admitting: *Deleted

## 2017-04-08 ENCOUNTER — Encounter: Payer: Self-pay | Admitting: Hematology and Oncology

## 2017-04-08 NOTE — Telephone Encounter (Signed)
Called for chemo follow up. Denies any nausea or vomiting . Eating and drinking well. No questions or concerns.

## 2017-04-08 NOTE — Assessment & Plan Note (Signed)
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 The intent is of curative intent.  We discussed some of the risks, benefits, side-effects of carboplatin  Some of the short term side-effects included, though not limited to, including weight loss, life threatening infections, risk of allergic reactions, need for transfusions of blood products, nausea, vomiting, change in bowel habits, loss of hair, admission to hospital for various reasons, and risks of death.   Long term side-effects are also discussed including risks of infertility, permanent damage to nerve function, hearing loss, chronic fatigue, kidney damage with possibility needing hemodialysis, and rare secondary malignancy including bone marrow disorders.  The patient is aware that the response rates discussed earlier is not guaranteed.  After a long discussion, patient made an informed decision to proceed with the prescribed plan of care.

## 2017-04-08 NOTE — Telephone Encounter (Signed)
-----   Message from Arman Bogus, RN sent at 04/07/2017 11:09 AM EST ----- Regarding: Dr. Alvy Bimler 1st tx f/u call She received her first dose of carboplatin today.  Please call patient to follow up. Thanks.

## 2017-04-08 NOTE — Progress Notes (Signed)
[place] OFFICE PROGRESS NOTE  Patient Care Team: Tower, [name], MD as PCP - General Rutherford Guys, MD as Consulting Physician (Ophthalmology) Sydnee Levans, MD as Consulting Physician (Dermatology) Jerrell Belfast, MD as Consulting Physician (Otolaryngology) Barrie Dunker Nathaneil Canary, DMD as Consulting Physician (Dentistry) Druscilla Brownie, MD as Consulting Physician (Dermatology) Heloise Purpura, DDS as Consulting Physician (Dentistry)  SUMMARY OF ONCOLOGIC HISTORY: Oncology History   Serous, strong ER and PR, MSI high     Endometrial ca ([place])   02/02/2017 Initial Diagnosis    High grade Endometrial ca ([place])      02/21/2017 Imaging    1. No evidence of metastatic endometrial carcinoma in pelvis. Carcinoma appears confined to the uterus. 2. Ovaries are small and difficult to define. 3. No evidence of metastatic disease in the abdomen. 4. Single small LEFT lower lobe pulmonary nodule is nonspecific. Recommend attention on follow-up      03/04/2017 Surgery    Robotic hysterectomy, BSO and SLN. Stage as IB UPSC. 92% myometrial invasive, no LVSI, negative nodes. Pre-op CT negative.      03/04/2017 Pathology Results    ENDOMETRIUM(Endometrium - All Specimens)   SPECIMEN  Procedure:Simple hysterectomy   Procedure:Bilateral salpingo-oophorectomy   Hysterectomy Type:Laparoscopic, robotic-assisted   Specimen Integrity:Intact   TUMOR  Tumor Site:Endometrium   Histologic Type:Serous carcinoma   Tumor Size:Greatest dimension in Centimeters (cm): 2 Centimeters (cm)  Tumor Extent:  Myometrial Invasion:Present   Depth of Invasion in Millimeters (mm):12 Millimeters (mm)  Myometrial Thickness in Millimeters (mm):13 Millimeters (mm)  Percentage of Myometrial Invasion:92 %  Adenomyosis:Present, uninvolved by carcinoma   Uterine Serosa Involvement:Not identified    Lower Uterine Segment Involvement:Not identified   Cervical Stromal Involvement:Not identified   Other Tissue / Organ Involvement:Not identified   Peritoneal Ascitic Fluid:Not submitted / unknown   Accessory Findings:  Lymphovascular Invasion:Not identified   MARGINS  LYMPH NODES  Number of Pelvic Nodes with Macrometastasis:0   Number of Pelvic Nodes with Micrometastasis:0   Number of Pelvic Nodes with Isolated Tumor Cells:0   Total Number of Pelvic Node(s) Examined (sentinel and nonsentinel):2   Number of Pelvic Sentinel Nodes Examined:2   Laterality of Pelvic Node(s) Examined:Right   Laterality of Pelvic Node(s) Examined:Left   Number of Para-aortic Nodes with Macrometastasis:0   Number of Para-aortic Nodes with Micrometastasis:0   Number of Para-aortic Nodes with Isolated Tumor Cells:0   Total Number of Para-aortic Node(s) Examined (sentinel and nonsentinel):1   Number of Para-aortic Sentinel Nodes Examined:1   Laterality of Para-aortic Node(s) Examined:Right   PATHOLOGIC STAGE CLASSIFICATION (pTNM, AJCC 8th Edition)  Primary Tumor (pT):pT1b   Regional Lymph Nodes (pN):  Modifier:(sn)   Category (pN):pN0   FIGO STAGE  FIGO Stage:IB  Much of the tumor shows extensive tubule formation, but with high grade nuclei. The tubule forming areas merge with poorly differentiated solid tumor with similar high grade nuclei. Immunohistochemical stains were performed with the following results:   Immunostain   Tubule forming areas  Solid areas  Cytokeratin AE1/AE3  Diffusely positive  Diffusely positive  Vimentin   Focal reactivity   Few cells positive  PAX-8    Diffusely positive  Patchy positivity  p16    Patchy positivity   Diffusely positive  p53    Negative (null)   Negative (null)  Estrogen receptor  95%, strong   80%,  moderate  Progesterone receptor  90%, strong   40%, weak to moderate  Although there are differences in the immunostaining pattern between the  tubule forming areas and the solid areas, the high grade nuclear features are similar and the p53 is completely negative (abnormal) in both areas. Based on these findings, the tumor is best classified as serous carcinoma.  Immunohistochemical stains for mismatch repair proteins performed on a representative block of tumor: Immunostains for mismatch repair proteins were performed on block D10 and show intact expression of MSH2 and MSH6 within the tumor. MLH1 and PMS2 are markedly decreased, but a few tumor cells are positive. This pattern does NOT support a diagnosis of microsatellite instability/hereditary non-polyposis colorectal cancer (HNPCC) associated carcinoma. Molecular testing for additional microsatellite instability markers will be performed by the [employer] [phone]) and reported separately.  SPECIMEN: Endometrium: Unstained slides/blocks received from [facility] with an original collection date of 03/04/2017 and a reported diagnosis of endometrial adenocarcinoma.    Tumor: [id number] D10 - Areas selected for macrodissection contain approximately 80% tumor nuclei. 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 Patients with MSI-H tumors are at elevated risk of having Lynch syndrome and carrying a heritable mutation in a mismatch-repair gene; however, the majority of these patients have a sporadic rather than inherited form of cancer (see comment).   This result is consistent with the reported decreased staining in mismatch repair proteins MLH1 and PMS2 by  immunohistochemistry ([id number]). MLH1 promoter methylation analysis is pending to determine the likelihood of Lynch syndrome in this patient and will be reported separately.  This result has implications for therapy selection, as MSI-H tumors are more likely to respond to immune modulating agents such as pembrolizumab Marin Comment DT, et al.  Alison Stalling J Med;[id number]:2509-20, 2015).  COMMENT: The MSI-H phenotype is detected in approximately 90% of colorectal and endometrial carcinoma arising in patients with Lynch syndrome/ Hereditary Non-Polyposis Colorectal Cancer (HNPCC) and in 10-15% of colorectal carcinoma and 20-30% of endometrial cancer arising sporadically. This phenotype is associated with germline or somatic inactivation of DNA mismatch repair genes (MLH1, MSH2, MSH6, PMS2) which results in an inability to correct mismatches generated in microsatellite sequences during DNA replication (Peltomaki P., Tora Perches [id number], 2003; Palomaki, GE, et al, Genetics in Medicine 11:42, 2009).     METHOD: DNA isolated from macrodissected paraffin-embedded slides of normal tissue and tumor tissue was PCR amplified across five mononucleotide microsatellite markers (BAT-25, BAT-26, NR-21, NR-24, and MONO-27) and analyzed by fluorescent capillary electrophoresis ([employer]ega).  Allelic profiles from the normal and tumor tissue were compared to determine MSI status at each of the five markers. MSI-H is designated when a detectable size difference exists in two or more of the five microsatellites tested.        04/01/2017 Procedure    Placement of a subcutaneous port device.       INTERVAL HISTORY: Please see below for problem oriented charting. She returns for chemotherapy consent She is doing well Denies new symptoms after last visit Her wound is healing well  REVIEW OF SYSTEMS:   Constitutional: Denies fevers, chills or abnormal weight loss Eyes: Denies blurriness of vision Ears, nose, mouth, throat, and  face: Denies mucositis or sore throat Respiratory: Denies cough, dyspnea or wheezes Cardiovascular: Denies palpitation, chest discomfort or lower extremity swelling Gastrointestinal:  Denies nausea, heartburn or change in bowel habits Skin: Denies abnormal skin rashes Lymphatics: Denies new lymphadenopathy or easy bruising  Neurological:Denies numbness, tingling or new weaknesses Behavioral/Psych: Mood is stable, no new changes  All other systems were reviewed with the patient and are negative.  I have reviewed the past medical history, past surgical history, social history and family history with the patient and they are unchanged from previous note.  ALLERGIES:  has No Known Allergies.  MEDICATIONS:  Current Outpatient Medications  Medication Sig Dispense Refill  . acetaminophen (TYLENOL) 325 MG tablet Take 650 mg by mouth.    Marland Kitchen aspirin EC 81 MG tablet Take 81 mg by mouth daily.    . fluticasone (FLONASE) 50 MCG/ACT nasal spray Place into the nose.    . lidocaine-prilocaine (EMLA) cream Apply to affected area once 30 g 3  . Misc. Devices (FOLDING [place]) MISC 1 Device by Does not apply route as needed (for use with ambulation as needed). Folding walker with seat and wide webbing for back support and safety for dx: M19.90 and Z91.81 (osteoarthritis and fall risk) 1 each 0  . Multiple Vitamins-Minerals (CENTRUM SILVER ADULT 50+ PO) Take 1 tablet by mouth daily.    . ondansetron (ZOFRAN) 8 MG tablet Take 1 tablet (8 mg total) by mouth 2 (two) times daily as needed for refractory nausea / vomiting. Start on day 3 after chemo. 30 tablet 1  . PARoxetine (PAXIL) 20 MG tablet TAKE ONE TABLET BY MOUTH ONCE DAILY IN THE MORNING 90 tablet 3  . polyethylene glycol (MIRALAX / GLYCOLAX) packet Take 17 g by mouth daily.    . prochlorperazine (COMPAZINE) 10 MG tablet Take 1 tablet (10 mg total) by mouth every 6 (six) hours as needed (Nausea or vomiting). 30 tablet 1  . simvastatin (ZOCOR) 40 MG tablet  TAKE ONE TABLET BY MOUTH EVERY DAY AT BEDTIME 90 tablet 3  . tolterodine (DETROL LA) 4 MG 24 hr capsule Take 1 capsule (4 mg total) by mouth daily. 90 capsule 3   No current facility-administered medications for this visit.     PHYSICAL EXAMINATION: ECOG PERFORMANCE STATUS: 1 - Symptomatic but completely ambulatory  Vitals:   04/06/17 1236  BP: (!) 131/54  Pulse: 87  Resp: 18  Temp: 98.8 F (37.1 C)  SpO2: 98%   Filed Weights   04/06/17 1236  Weight: 171 lb 3.2 oz (77.7 kg)    GENERAL:alert, no distress and comfortable SKIN: skin color, texture, turgor are normal, no rashes or significant lesions EYES: normal, Conjunctiva are pink and non-injected, sclera clear OROPHARYNX:no exudate, no erythema and lips, buccal mucosa, and tongue normal  NECK: supple, thyroid normal size, non-tender, without nodularity LYMPH:  no palpable lymphadenopathy in the cervical, axillary or inguinal LUNGS: clear to auscultation and percussion with normal breathing effort HEART: regular rate & rhythm and no murmurs and no lower extremity edema ABDOMEN:abdomen soft, non-tender and normal bowel sounds Musculoskeletal:no cyanosis of digits and no clubbing  NEURO: alert & oriented x 3 with fluent speech, no focal motor/sensory deficits  LABORATORY DATA:  I have reviewed the data as listed    Component Value Date/Time   NA 140 04/01/2017 1001   K 4.3 04/01/2017 1001   CL 106 04/01/2017 1001   CO2 25 04/01/2017 1001   GLUCOSE 105 (H) 04/01/2017 1001   BUN 17 04/01/2017 1001   CREATININE 0.91 04/01/2017 1001   CALCIUM 9.5 04/01/2017 1001   PROT 7.5 04/01/2017 1001   ALBUMIN 4.2 04/01/2017 1001   AST 32 04/01/2017 1001   ALT 18 04/01/2017 1001   ALKPHOS 47 04/01/2017 1001  BILITOT 0.3 04/01/2017 1001   GFRNONAA 58 (L) 04/01/2017 1001   GFRAA >60 04/01/2017 1001    No results found for: SPEP, UPEP  Lab Results  Component Value Date   WBC 6.9 04/01/2017   NEUTROABS 4.1 04/01/2017   HGB  12.6 04/01/2017   HCT 37.4 04/01/2017   MCV 92.6 04/01/2017   PLT 314 04/01/2017      Chemistry      Component Value Date/Time   NA 140 04/01/2017 1001   K 4.3 04/01/2017 1001   CL 106 04/01/2017 1001   CO2 25 04/01/2017 1001   BUN 17 04/01/2017 1001   CREATININE 0.91 04/01/2017 1001      Component Value Date/Time   CALCIUM 9.5 04/01/2017 1001   ALKPHOS 47 04/01/2017 1001   AST 32 04/01/2017 1001   ALT 18 04/01/2017 1001   BILITOT 0.3 04/01/2017 1001       RADIOGRAPHIC STUDIES: I have personally reviewed the radiological images as listed and agreed with the findings in the report. Ir US Guide Vasc Access Right  Result Date: 04/01/2017 INDICATION: 83 year old with endometrial cancer. Port-A-Cath needed for treatment. EXAM: FLUOROSCOPIC AND ULTRASOUND GUIDED PLACEMENT OF A SUBCUTANEOUS PORT COMPARISON:  None. MEDICATIONS: Ancef 2 g; The antibiotic was administered within an appropriate time interval prior to skin puncture. ANESTHESIA/SEDATION: The patient was continuously monitored during the procedure by the interventional radiology nurse under my direct supervision. Versed 1.0 mg IV; Fentanyl 50 mcg IV; Moderate Sedation Time:  36 minutes FLUOROSCOPY TIME:  Fluoroscopy Time: 18 seconds, 2 mGy COMPLICATIONS: None immediate. PROCEDURE: The procedure, risks, benefits, and alternatives were explained to the patient. Questions regarding the procedure were encouraged and answered. The patient understands and consents to the procedure. Patient was placed supine on the interventional table. Ultrasound confirmed a patent right internal jugular vein. The right chest and neck were cleaned with a skin antiseptic and a sterile drape was placed. Maximal barrier sterile technique was utilized including caps, mask, sterile gowns, sterile gloves, sterile drape, hand hygiene and skin antiseptic. The right neck was anesthetized with 1% lidocaine. Small incision was made in the right neck with a blade.  Micropuncture set was placed in the right internal jugular vein with ultrasound guidance. The micropuncture wire was used for measurement purposes. The right chest was anesthetized with 1% lidocaine with epinephrine. #15 blade was used to make an incision and a subcutaneous port pocket was formed. [place] was assembled. Subcutaneous tunnel was formed with a stiff tunneling device. The port catheter was brought through the subcutaneous tunnel. The port was placed in the subcutaneous pocket and sutured in place. The micropuncture set was exchanged for a peel-away sheath. The catheter was placed through the peel-away sheath and the tip was positioned at the superior cavoatrial junction. Catheter placement was confirmed with fluoroscopy. The port was accessed and flushed with heparinized saline. The port pocket was closed using two layers of absorbable sutures and Dermabond. The vein skin site was closed using a single layer of absorbable suture and Dermabond. Sterile dressings were applied. Patient tolerated the procedure well without an immediate complication. Ultrasound and fluoroscopic images were taken and saved for this procedure. IMPRESSION: Placement of a subcutaneous port device. Electronically Signed   By: Markus Daft M.D.   On: 04/01/2017 14:01   Ir Fluoro Guide Port Insertion Right  Result Date: 04/01/2017 INDICATION: 83 year old with endometrial cancer. Port-A-Cath needed for treatment. EXAM: FLUOROSCOPIC AND ULTRASOUND GUIDED PLACEMENT OF A SUBCUTANEOUS PORT COMPARISON:  None. MEDICATIONS: Ancef 2 g; The antibiotic was administered within an appropriate time interval prior to skin puncture. ANESTHESIA/SEDATION: The patient was continuously monitored during the procedure by the interventional radiology nurse under my direct supervision. Versed 1.0 mg IV; Fentanyl 50 mcg IV; Moderate Sedation Time:  36 minutes FLUOROSCOPY TIME:  Fluoroscopy Time: 18 seconds, 2 mGy COMPLICATIONS: None  immediate. PROCEDURE: The procedure, risks, benefits, and alternatives were explained to the patient. Questions regarding the procedure were encouraged and answered. The patient understands and consents to the procedure. Patient was placed supine on the interventional table. Ultrasound confirmed a patent right internal jugular vein. The right chest and neck were cleaned with a skin antiseptic and a sterile drape was placed. Maximal barrier sterile technique was utilized including caps, mask, sterile gowns, sterile gloves, sterile drape, hand hygiene and skin antiseptic. The right neck was anesthetized with 1% lidocaine. Small incision was made in the right neck with a blade. Micropuncture set was placed in the right internal jugular vein with ultrasound guidance. The micropuncture wire was used for measurement purposes. The right chest was anesthetized with 1% lidocaine with epinephrine. #15 blade was used to make an incision and a subcutaneous port pocket was formed. [place] was assembled. Subcutaneous tunnel was formed with a stiff tunneling device. The port catheter was brought through the subcutaneous tunnel. The port was placed in the subcutaneous pocket and sutured in place. The micropuncture set was exchanged for a peel-away sheath. The catheter was placed through the peel-away sheath and the tip was positioned at the superior cavoatrial junction. Catheter placement was confirmed with fluoroscopy. The port was accessed and flushed with heparinized saline. The port pocket was closed using two layers of absorbable sutures and Dermabond. The vein skin site was closed using a single layer of absorbable suture and Dermabond. Sterile dressings were applied. Patient tolerated the procedure well without an immediate complication. Ultrasound and fluoroscopic images were taken and saved for this procedure. IMPRESSION: Placement of a subcutaneous port device. Electronically Signed   By: Markus Daft M.D.   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 The intent is of curative intent.  We discussed some of the risks, benefits, side-effects of carboplatin  Some of the short term side-effects included, though not limited to, including weight loss, life threatening infections, risk of allergic reactions, need for transfusions of blood products, nausea, vomiting, change in bowel habits, loss of hair, admission to hospital for various reasons, and risks of death.   Long term side-effects are also discussed including risks of infertility, permanent damage to nerve function, hearing loss, chronic fatigue, kidney damage with possibility needing hemodialysis, and rare secondary malignancy including bone marrow disorders.  The patient is aware that the response rates discussed earlier is not guaranteed.  After a long discussion, patient made an informed decision to proceed with the prescribed plan of care.    Paresthesia of bilateral legs The patient had a spinal cord injury many years ago She has baseline paresthesia affecting her lower extremities We discussed potential risk of worsening neuropathy with carboplatin   Orders Placed This Encounter  Procedures  .  Ambulatory referral to Genetics    Referral Priority:   Routine    Referral Type:   Consultation    Referral Reason:   Specialty Services Required    Number of Visits Requested:   1   All questions were answered. The patient knows to call the clinic with any problems, questions or concerns. No barriers to learning was detected. I spent 15 minutes counseling the patient  face to face. The total time spent in the appointment was 20 minutes and more than 50% was on counseling and review of test results     Heath Lark, MD 04/08/2017 8:14 AM

## 2017-04-08 NOTE — Assessment & Plan Note (Signed)
The patient had a spinal cord injury many years ago She has baseline paresthesia affecting her lower extremities We discussed potential risk of worsening neuropathy with carboplatin

## 2017-04-09 ENCOUNTER — Telehealth: Payer: Self-pay | Admitting: Hematology and Oncology

## 2017-04-09 NOTE — Telephone Encounter (Signed)
LVm to return call in reference to genetics appointment

## 2017-04-10 ENCOUNTER — Telehealth: Payer: Self-pay | Admitting: Hematology and Oncology

## 2017-04-10 NOTE — Telephone Encounter (Signed)
No 12/11 los  °

## 2017-04-13 ENCOUNTER — Encounter: Payer: Self-pay | Admitting: Family Medicine

## 2017-04-13 ENCOUNTER — Ambulatory Visit (INDEPENDENT_AMBULATORY_CARE_PROVIDER_SITE_OTHER): Payer: Medicare Other | Admitting: Family Medicine

## 2017-04-13 VITALS — BP 136/62 | HR 66 | Temp 98.7°F | Ht 65.0 in | Wt 166.5 lb

## 2017-04-13 DIAGNOSIS — E782 Mixed hyperlipidemia: Secondary | ICD-10-CM

## 2017-04-13 DIAGNOSIS — Z8659 Personal history of other mental and behavioral disorders: Secondary | ICD-10-CM | POA: Diagnosis not present

## 2017-04-13 DIAGNOSIS — C541 Malignant neoplasm of endometrium: Secondary | ICD-10-CM

## 2017-04-13 MED ORDER — SIMVASTATIN 40 MG PO TABS: ORAL_TABLET | ORAL | 3 refills | Status: DC

## 2017-04-13 MED ORDER — TOLTERODINE TARTRATE ER 4 MG PO CP24: 4.0000 mg | ORAL_CAPSULE | Freq: Every day | ORAL | 3 refills | Status: DC

## 2017-04-13 MED ORDER — PAROXETINE HCL 20 MG PO TABS: ORAL_TABLET | ORAL | 3 refills | Status: DC
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
## 2017-04-13 NOTE — Patient Instructions (Addendum)
I sent in your medicines  Continue your treatment  Also see the genetic counselor as planned   Lab for cholesterol today   Take care!  You are doing great- I am proud  Let me know if you need anything

## 2017-04-14 ENCOUNTER — Telehealth: Payer: Self-pay | Admitting: *Deleted

## 2017-04-14 LAB — LIPID PANEL
Cholesterol: 179 mg/dL (ref 0–200)
HDL: 61.1 mg/dL (ref >39.00)
LDL Cholesterol: 96 mg/dL (ref 0–99)
NonHDL: 118.24
Total CHOL/HDL Ratio: 3
Triglycerides: 110 mg/dL (ref 0.0–149.0)
VLDL: 22 mg/dL (ref 0.0–40.0)

## 2017-04-14 NOTE — Telephone Encounter (Signed)
Received fax saying PA was approved, pharmacy notified and letter placed in Dr. Marliss Coots inbox to sign and send for scanning

## 2017-04-14 NOTE — Assessment & Plan Note (Signed)
Doing very well/esp in light of cancer diag and tx Reviewed stressors/ coping techniques/symptoms/ support sources/ tx options and side effects in detail today Good self awareness  Wants to continue paroxetine -refilled

## 2017-04-14 NOTE — Assessment & Plan Note (Addendum)
Pt is doing well s/p hysterectomy  Undergoing chemotx - tolerating well  Planning for radiation as well  Remains active  Doing well emotionally also utd other cancer screening and pending genetic eval also

## 2017-04-14 NOTE — Assessment & Plan Note (Signed)
Due for lipid check today  Disc goals for lipids and reasons to control them Rev labs with pt (prev profile)  Rev low sat fat diet in detail

## 2017-04-14 NOTE — Telephone Encounter (Signed)
PA done at [email or web address] for Paxil I will await a response

## 2017-04-15 ENCOUNTER — Encounter: Payer: Self-pay | Admitting: *Deleted

## 2017-04-28 ENCOUNTER — Other Ambulatory Visit: Payer: Self-pay | Admitting: Hematology and Oncology

## 2017-04-28 ENCOUNTER — Encounter: Payer: Self-pay | Admitting: Hematology and Oncology

## 2017-04-28 ENCOUNTER — Ambulatory Visit (HOSPITAL_BASED_OUTPATIENT_CLINIC_OR_DEPARTMENT_OTHER): Payer: Medicare Other | Admitting: Hematology and Oncology

## 2017-04-28 ENCOUNTER — Ambulatory Visit: Payer: Medicare Other

## 2017-04-28 ENCOUNTER — Ambulatory Visit (HOSPITAL_BASED_OUTPATIENT_CLINIC_OR_DEPARTMENT_OTHER): Payer: Medicare Other

## 2017-04-28 ENCOUNTER — Telehealth: Payer: Self-pay | Admitting: Hematology and Oncology

## 2017-04-28 ENCOUNTER — Other Ambulatory Visit (HOSPITAL_BASED_OUTPATIENT_CLINIC_OR_DEPARTMENT_OTHER): Payer: Medicare Other

## 2017-04-28 DIAGNOSIS — C541 Malignant neoplasm of endometrium: Secondary | ICD-10-CM

## 2017-04-28 DIAGNOSIS — D6481 Anemia due to antineoplastic chemotherapy: Secondary | ICD-10-CM | POA: Diagnosis not present

## 2017-04-28 DIAGNOSIS — R202 Paresthesia of skin: Secondary | ICD-10-CM

## 2017-04-28 DIAGNOSIS — Z5111 Encounter for antineoplastic chemotherapy: Secondary | ICD-10-CM | POA: Diagnosis not present

## 2017-04-28 DIAGNOSIS — T451X5A Adverse effect of antineoplastic and immunosuppressive drugs, initial encounter: Secondary | ICD-10-CM

## 2017-04-28 LAB — COMPREHENSIVE METABOLIC PANEL
ALBUMIN: 3.9 g/dL (ref 3.5–5.0)
ALK PHOS: 42 U/L (ref 40–150)
ALT: 11 U/L (ref 0–55)
AST: 18 U/L (ref 5–34)
Anion Gap: 9 mEq/L (ref 3–11)
BUN: 13.1 mg/dL (ref 7.0–26.0)
CALCIUM: 9.2 mg/dL (ref 8.4–10.4)
CO2: 26 mEq/L (ref 22–29)
CREATININE: 0.9 mg/dL (ref 0.6–1.1)
Chloride: 106 mEq/L (ref 98–109)
EGFR: 58 mL/min/{1.73_m2} — ABNORMAL LOW (ref >60)
Glucose: 90 mg/dl (ref 70–140)
Potassium: 4.5 mEq/L (ref 3.5–5.1)
Sodium: 142 mEq/L (ref 136–145)
Total Bilirubin: 0.36 mg/dL (ref 0.20–1.20)
Total Protein: 6.8 g/dL (ref 6.4–8.3)

## 2017-04-28 LAB — CBC WITH DIFFERENTIAL/PLATELET
BASO%: 0.6 % (ref 0.0–2.0)
BASOS ABS: 0 10*3/uL (ref 0.0–0.1)
EOS ABS: 0.1 10*3/uL (ref 0.0–0.5)
EOS%: 1.3 % (ref 0.0–7.0)
HEMATOCRIT: 33.7 % — AB (ref 34.8–46.6)
HEMOGLOBIN: 11 g/dL — AB (ref 11.6–15.9)
LYMPH#: 1.6 10*3/uL (ref 0.9–3.3)
LYMPH%: 31.1 % (ref 14.0–49.7)
MCH: 30.8 pg (ref 25.1–34.0)
MCHC: 32.6 g/dL (ref 31.5–36.0)
MCV: 94.4 fL (ref 79.5–101.0)
MONO#: 0.4 10*3/uL (ref 0.1–0.9)
MONO%: 6.6 % (ref 0.0–14.0)
NEUT#: 3.2 10*3/uL (ref 1.5–6.5)
NEUT%: 60.4 % (ref 38.4–76.8)
PLATELETS: 167 10*3/uL (ref 145–400)
RBC: 3.57 10*6/uL — ABNORMAL LOW (ref 3.70–5.45)
RDW: 14.1 % (ref 11.2–14.5)
WBC: 5.3 10*3/uL (ref 3.9–10.3)

## 2017-04-28 MED ORDER — PALONOSETRON HCL INJECTION 0.25 MG/5ML
INTRAVENOUS | Status: AC
  Filled 2017-04-28: qty 5

## 2017-04-28 MED ORDER — SODIUM CHLORIDE 0.9 % IV SOLN
Freq: Once | INTRAVENOUS | Status: AC
  Administered 2017-04-28: 14:00:00 via INTRAVENOUS

## 2017-04-28 MED ORDER — HEPARIN SOD (PORK) LOCK FLUSH 100 UNIT/ML IV SOLN
500.0000 [IU] | Freq: Once | INTRAVENOUS | Status: AC | PRN
  Administered 2017-04-28: 500 [IU]
  Filled 2017-04-28: qty 5

## 2017-04-28 MED ORDER — SODIUM CHLORIDE 0.9% FLUSH
10.0000 mL | Freq: Once | INTRAVENOUS | Status: AC
  Administered 2017-04-28: 10 mL
  Filled 2017-04-28: qty 10

## 2017-04-28 MED ORDER — SODIUM CHLORIDE 0.9 % IV SOLN
Freq: Once | INTRAVENOUS | Status: AC
  Administered 2017-04-28: 14:00:00 via INTRAVENOUS
  Filled 2017-04-28: qty 5

## 2017-04-28 MED ORDER — SODIUM CHLORIDE 0.9 % IV SOLN
395.5000 mg | Freq: Once | INTRAVENOUS | Status: AC
  Administered 2017-04-28: 400 mg via INTRAVENOUS
  Filled 2017-04-28: qty 40

## 2017-04-28 MED ORDER — PALONOSETRON HCL INJECTION 0.25 MG/5ML
0.2500 mg | Freq: Once | INTRAVENOUS | Status: AC
  Administered 2017-04-28: 0.25 mg via INTRAVENOUS

## 2017-04-28 MED ORDER — SODIUM CHLORIDE 0.9% FLUSH
10.0000 mL | INTRAVENOUS | Status: DC | PRN
  Administered 2017-04-28: 10 mL
  Filled 2017-04-28: qty 10

## 2017-04-28 NOTE — Assessment & Plan Note (Signed)
This is likely due to recent treatment. The patient denies recent history of bleeding such as epistaxis, hematuria or hematochezia. She is asymptomatic from the anemia. I will observe for now.  She does not require transfusion now. I will continue the chemotherapy at current dose without dosage adjustment.  

## 2017-04-28 NOTE — Telephone Encounter (Signed)
Gave patient avs and calendar with appts per 1/3 los.

## 2017-04-28 NOTE — Assessment & Plan Note (Signed)
The patient had a spinal cord injury many years ago She has baseline paresthesia affecting her lower extremities So far, chemotherapy has not affected her neuropathy and weakness

## 2017-04-28 NOTE — Patient Instructions (Signed)
[facility] Discharge Instructions for Patients Receiving Chemotherapy  Today you received the following chemotherapy agents: Carboplatin.  To help prevent nausea and vomiting after your treatment, we encourage you to take your nausea medication as prescribed.   If you develop nausea and vomiting that is not controlled by your nausea medication, call the clinic.   BELOW ARE SYMPTOMS THAT SHOULD BE REPORTED IMMEDIATELY:  *FEVER GREATER THAN 100.5 F  *CHILLS WITH OR WITHOUT FEVER  NAUSEA AND VOMITING THAT IS NOT CONTROLLED WITH YOUR NAUSEA MEDICATION  *UNUSUAL SHORTNESS OF BREATH  *UNUSUAL BRUISING OR BLEEDING  TENDERNESS IN MOUTH AND THROAT WITH OR WITHOUT PRESENCE OF ULCERS  *URINARY PROBLEMS  *BOWEL PROBLEMS  UNUSUAL RASH Items with * indicate a potential emergency and should be followed up as soon as possible.  Feel free to call the clinic should you have any questions or concerns. The clinic phone number is [phone].  Please show the CHEMO ALERT CARD at check-in to the Emergency Department and triage nurse.     

## 2017-04-28 NOTE — Progress Notes (Signed)
[place] OFFICE PROGRESS NOTE  Patient Care Team: Tower, [name], MD as PCP - General Rutherford Guys, MD as Consulting Physician (Ophthalmology) Sydnee Levans, MD as Consulting Physician (Dermatology) Jerrell Belfast, MD as Consulting Physician (Otolaryngology) Barrie Dunker Nathaneil Canary, DMD as Consulting Physician (Dentistry) Druscilla Brownie, MD as Consulting Physician (Dermatology) Heloise Purpura, DDS as Consulting Physician (Dentistry)  SUMMARY OF ONCOLOGIC HISTORY: Oncology History   Serous, strong ER and PR, MSI high     Endometrial ca ([place])   02/02/2017 Initial Diagnosis    High grade Endometrial ca ([place])      02/21/2017 Imaging    1. No evidence of metastatic endometrial carcinoma in pelvis. Carcinoma appears confined to the uterus. 2. Ovaries are small and difficult to define. 3. No evidence of metastatic disease in the abdomen. 4. Single small LEFT lower lobe pulmonary nodule is nonspecific. Recommend attention on follow-up      03/04/2017 Surgery    Robotic hysterectomy, BSO and SLN. Stage as IB UPSC. 92% myometrial invasive, no LVSI, negative nodes. Pre-op CT negative.      03/04/2017 Pathology Results    ENDOMETRIUM(Endometrium - All Specimens)   SPECIMEN  Procedure:Simple hysterectomy   Procedure:Bilateral salpingo-oophorectomy   Hysterectomy Type:Laparoscopic, robotic-assisted   Specimen Integrity:Intact   TUMOR  Tumor Site:Endometrium   Histologic Type:Serous carcinoma   Tumor Size:Greatest dimension in Centimeters (cm): 2 Centimeters (cm)  Tumor Extent:  Myometrial Invasion:Present   Depth of Invasion in Millimeters (mm):12 Millimeters (mm)  Myometrial Thickness in Millimeters (mm):13 Millimeters (mm)  Percentage of Myometrial Invasion:92 %  Adenomyosis:Present, uninvolved by carcinoma   Uterine Serosa Involvement:Not identified    Lower Uterine Segment Involvement:Not identified   Cervical Stromal Involvement:Not identified   Other Tissue / Organ Involvement:Not identified   Peritoneal Ascitic Fluid:Not submitted / unknown   Accessory Findings:  Lymphovascular Invasion:Not identified   MARGINS  LYMPH NODES  Number of Pelvic Nodes with Macrometastasis:0   Number of Pelvic Nodes with Micrometastasis:0   Number of Pelvic Nodes with Isolated Tumor Cells:0   Total Number of Pelvic Node(s) Examined (sentinel and nonsentinel):2   Number of Pelvic Sentinel Nodes Examined:2   Laterality of Pelvic Node(s) Examined:Right   Laterality of Pelvic Node(s) Examined:Left   Number of Para-aortic Nodes with Macrometastasis:0   Number of Para-aortic Nodes with Micrometastasis:0   Number of Para-aortic Nodes with Isolated Tumor Cells:0   Total Number of Para-aortic Node(s) Examined (sentinel and nonsentinel):1   Number of Para-aortic Sentinel Nodes Examined:1   Laterality of Para-aortic Node(s) Examined:Right   PATHOLOGIC STAGE CLASSIFICATION (pTNM, AJCC 8th Edition)  Primary Tumor (pT):pT1b   Regional Lymph Nodes (pN):  Modifier:(sn)   Category (pN):pN0   FIGO STAGE  FIGO Stage:IB  Much of the tumor shows extensive tubule formation, but with high grade nuclei. The tubule forming areas merge with poorly differentiated solid tumor with similar high grade nuclei. Immunohistochemical stains were performed with the following results:   Immunostain   Tubule forming areas  Solid areas  Cytokeratin AE1/AE3  Diffusely positive  Diffusely positive  Vimentin   Focal reactivity   Few cells positive  PAX-8    Diffusely positive  Patchy positivity  p16    Patchy positivity   Diffusely positive  p53    Negative (null)   Negative (null)  Estrogen receptor  95%, strong   80%,  moderate  Progesterone receptor  90%, strong   40%, weak to moderate  Although there are differences in the immunostaining pattern between the  tubule forming areas and the solid areas, the high grade nuclear features are similar and the p53 is completely negative (abnormal) in both areas. Based on these findings, the tumor is best classified as serous carcinoma.  Immunohistochemical stains for mismatch repair proteins performed on a representative block of tumor: Immunostains for mismatch repair proteins were performed on block D10 and show intact expression of MSH2 and MSH6 within the tumor. MLH1 and PMS2 are markedly decreased, but a few tumor cells are positive. This pattern does NOT support a diagnosis of microsatellite instability/hereditary non-polyposis colorectal cancer (HNPCC) associated carcinoma. Molecular testing for additional microsatellite instability markers will be performed by the [employer] [phone]) and reported separately.  SPECIMEN: Endometrium: Unstained slides/blocks received from [facility] with an original collection date of 03/04/2017 and a reported diagnosis of endometrial adenocarcinoma.    Tumor: [id number] D10 - Areas selected for macrodissection contain approximately 80% tumor nuclei. 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 Patients with MSI-H tumors are at elevated risk of having Lynch syndrome and carrying a heritable mutation in a mismatch-repair gene; however, the majority of these patients have a sporadic rather than inherited form of cancer (see comment).   This result is consistent with the reported decreased staining in mismatch repair proteins MLH1 and PMS2 by  immunohistochemistry ([id number]). MLH1 promoter methylation analysis is pending to determine the likelihood of Lynch syndrome in this patient and will be reported separately.  This result has implications for therapy selection, as MSI-H tumors are more likely to respond to immune modulating agents such as pembrolizumab Marin Comment DT, et al.  Alison Stalling J Med;[id number]:2509-20, 2015).  COMMENT: The MSI-H phenotype is detected in approximately 90% of colorectal and endometrial carcinoma arising in patients with Lynch syndrome/ Hereditary Non-Polyposis Colorectal Cancer (HNPCC) and in 10-15% of colorectal carcinoma and 20-30% of endometrial cancer arising sporadically. This phenotype is associated with germline or somatic inactivation of DNA mismatch repair genes (MLH1, MSH2, MSH6, PMS2) which results in an inability to correct mismatches generated in microsatellite sequences during DNA replication (Peltomaki P., Tora Perches [id number], 2003; Palomaki, GE, et al, Genetics in Medicine 11:42, 2009).     METHOD: DNA isolated from macrodissected paraffin-embedded slides of normal tissue and tumor tissue was PCR amplified across five mononucleotide microsatellite markers (BAT-25, BAT-26, NR-21, NR-24, and MONO-27) and analyzed by fluorescent capillary electrophoresis ([employer]ega).  Allelic profiles from the normal and tumor tissue were compared to determine MSI status at each of the five markers. MSI-H is designated when a detectable size difference exists in two or more of the five microsatellites tested.        04/01/2017 Procedure    Placement of a subcutaneous port device.      04/07/2017 -  Chemotherapy    The patient had single agent carboplatin       INTERVAL HISTORY: Please see below for problem oriented charting. She is seen prior to cycle 2 of chemotherapy She is doing well She denies nausea, vomiting or recent infection Her appetite is stable, no recent weight loss She denies worsening peripheral  neuropathy or weakness. REVIEW OF SYSTEMS:   Constitutional: Denies fevers, chills or abnormal weight loss Eyes: Denies blurriness of vision Ears, nose, mouth, throat, and face: Denies mucositis or sore throat Respiratory: Denies cough, dyspnea or  wheezes Cardiovascular: Denies palpitation, chest discomfort or lower extremity swelling Gastrointestinal:  Denies nausea, heartburn or change in bowel habits Skin: Denies abnormal skin rashes Lymphatics: Denies new lymphadenopathy or easy bruising Neurological:Denies numbness, tingling or new weaknesses Behavioral/Psych: Mood is stable, no new changes  All other systems were reviewed with the patient and are negative.  I have reviewed the past medical history, past surgical history, social history and family history with the patient and they are unchanged from previous note.  ALLERGIES:  has No Known Allergies.  MEDICATIONS:  Current Outpatient Medications  Medication Sig Dispense Refill  . acetaminophen (TYLENOL) 325 MG tablet Take 650 mg by mouth.    Marland Kitchen aspirin EC 81 MG tablet Take 81 mg by mouth daily.    . fluticasone (FLONASE) 50 MCG/ACT nasal spray Place into the nose.    . lidocaine-prilocaine (EMLA) cream Apply to affected area once 30 g 3  . Misc. Devices (FOLDING [place]) MISC 1 Device by Does not apply route as needed (for use with ambulation as needed). Folding walker with seat and wide webbing for back support and safety for dx: M19.90 and Z91.81 (osteoarthritis and fall risk) 1 each 0  . Multiple Vitamins-Minerals (CENTRUM SILVER ADULT 50+ PO) Take 1 tablet by mouth daily.    . ondansetron (ZOFRAN) 8 MG tablet Take 1 tablet (8 mg total) by mouth 2 (two) times daily as needed for refractory nausea / vomiting. Start on day 3 after chemo. 30 tablet 1  . PARoxetine (PAXIL) 20 MG tablet TAKE ONE TABLET BY MOUTH ONCE DAILY IN THE MORNING 90 tablet 3  . polyethylene glycol (MIRALAX / GLYCOLAX) packet Take 17 g by mouth daily.    .  prochlorperazine (COMPAZINE) 10 MG tablet Take 1 tablet (10 mg total) by mouth every 6 (six) hours as needed (Nausea or vomiting). 30 tablet 1  . simvastatin (ZOCOR) 40 MG tablet TAKE ONE TABLET BY MOUTH EVERY DAY AT BEDTIME 90 tablet 3  . tolterodine (DETROL LA) 4 MG 24 hr capsule Take 1 capsule (4 mg total) by mouth daily. 90 capsule 3   No current facility-administered medications for this visit.    Facility-Administered Medications Ordered in Other Visits  Medication Dose Route Frequency Provider Last Rate Last Dose  . CARBOplatin (PARAPLATIN) 400 mg in sodium chloride 0.9 % 250 mL chemo infusion  400 mg Intravenous Once Alvy Bimler, Perkins Molina, MD      . fosaprepitant (EMEND) 150 mg, dexamethasone (DECADRON) 12 mg in sodium chloride 0.9 % 145 mL IVPB   Intravenous Once Heath Lark, MD 454 mL/hr at 04/28/17 1405    . heparin lock flush 100 unit/mL  500 Units Intracatheter Once PRN Alvy Bimler, Zania Kalisz, MD      . sodium chloride flush (NS) 0.9 % injection 10 mL  10 mL Intracatheter PRN Alvy Bimler, Gazelle Towe, MD        PHYSICAL EXAMINATION: ECOG PERFORMANCE STATUS: 1 - Symptomatic but completely ambulatory  Vitals:   04/28/17 1252  BP: 123/63  Pulse: 72  Resp: 18  Temp: 98.7 F (37.1 C)  SpO2: 99%   Filed Weights   04/28/17 1252  Weight: 169 lb (76.7 kg)    GENERAL:alert, no distress and comfortable SKIN: skin color, texture, turgor are normal, no rashes or significant lesions EYES: normal, Conjunctiva are pink and non-injected, sclera clear OROPHARYNX:no exudate, no erythema and lips, buccal mucosa, and tongue normal  NECK: supple, thyroid normal size, non-tender, without nodularity LYMPH:  no palpable lymphadenopathy in the cervical, axillary  or inguinal LUNGS: clear to auscultation and percussion with normal breathing effort HEART: regular rate & rhythm and no murmurs and no lower extremity edema ABDOMEN:abdomen soft, non-tender and normal bowel sounds Musculoskeletal:no cyanosis of digits and no  clubbing  NEURO: alert & oriented x 3 with fluent speech, no focal motor/sensory deficits  LABORATORY DATA:  I have reviewed the data as listed    Component Value Date/Time   NA 142 04/28/2017 1207   K 4.5 04/28/2017 1207   CL 106 04/01/2017 1001   CO2 26 04/28/2017 1207   GLUCOSE 90 04/28/2017 1207   BUN 13.1 04/28/2017 1207   CREATININE 0.9 04/28/2017 1207   CALCIUM 9.2 04/28/2017 1207   PROT 6.8 04/28/2017 1207   ALBUMIN 3.9 04/28/2017 1207   AST 18 04/28/2017 1207   ALT 11 04/28/2017 1207   ALKPHOS 42 04/28/2017 1207   BILITOT 0.36 04/28/2017 1207   GFRNONAA 58 (L) 04/01/2017 1001   GFRAA >60 04/01/2017 1001    No results found for: SPEP, UPEP  Lab Results  Component Value Date   WBC 5.3 04/28/2017   NEUTROABS 3.2 04/28/2017   HGB 11.0 (L) 04/28/2017   HCT 33.7 (L) 04/28/2017   MCV 94.4 04/28/2017   PLT 167 04/28/2017      Chemistry      Component Value Date/Time   NA 142 04/28/2017 1207   K 4.5 04/28/2017 1207   CL 106 04/01/2017 1001   CO2 26 04/28/2017 1207   BUN 13.1 04/28/2017 1207   CREATININE 0.9 04/28/2017 1207      Component Value Date/Time   CALCIUM 9.2 04/28/2017 1207   ALKPHOS 42 04/28/2017 1207   AST 18 04/28/2017 1207   ALT 11 04/28/2017 1207   BILITOT 0.36 04/28/2017 1207       RADIOGRAPHIC STUDIES: I have personally reviewed the radiological images as listed and agreed with the findings in the report. Ir US Guide Vasc Access Right  Result Date: 04/01/2017 INDICATION: 84 year old with endometrial cancer. Port-A-Cath needed for treatment. EXAM: FLUOROSCOPIC AND ULTRASOUND GUIDED PLACEMENT OF A SUBCUTANEOUS PORT COMPARISON:  None. MEDICATIONS: Ancef 2 g; The antibiotic was administered within an appropriate time interval prior to skin puncture. ANESTHESIA/SEDATION: The patient was continuously monitored during the procedure by the interventional radiology nurse under my direct supervision. Versed 1.0 mg IV; Fentanyl 50 mcg IV; Moderate  Sedation Time:  36 minutes FLUOROSCOPY TIME:  Fluoroscopy Time: 18 seconds, 2 mGy COMPLICATIONS: None immediate. PROCEDURE: The procedure, risks, benefits, and alternatives were explained to the patient. Questions regarding the procedure were encouraged and answered. The patient understands and consents to the procedure. Patient was placed supine on the interventional table. Ultrasound confirmed a patent right internal jugular vein. The right chest and neck were cleaned with a skin antiseptic and a sterile drape was placed. Maximal barrier sterile technique was utilized including caps, mask, sterile gowns, sterile gloves, sterile drape, hand hygiene and skin antiseptic. The right neck was anesthetized with 1% lidocaine. Small incision was made in the right neck with a blade. Micropuncture set was placed in the right internal jugular vein with ultrasound guidance. The micropuncture wire was used for measurement purposes. The right chest was anesthetized with 1% lidocaine with epinephrine. #15 blade was used to make an incision and a subcutaneous port pocket was formed. [place] was assembled. Subcutaneous tunnel was formed with a stiff tunneling device. The port catheter was brought through the subcutaneous tunnel. The port was placed in the subcutaneous  pocket and sutured in place. The micropuncture set was exchanged for a peel-away sheath. The catheter was placed through the peel-away sheath and the tip was positioned at the superior cavoatrial junction. Catheter placement was confirmed with fluoroscopy. The port was accessed and flushed with heparinized saline. The port pocket was closed using two layers of absorbable sutures and Dermabond. The vein skin site was closed using a single layer of absorbable suture and Dermabond. Sterile dressings were applied. Patient tolerated the procedure well without an immediate complication. Ultrasound and fluoroscopic images were taken and saved for this procedure.  IMPRESSION: Placement of a subcutaneous port device. Electronically Signed   By: Markus Daft M.D.   On: 04/01/2017 14:01   Ir Fluoro Guide Port Insertion Right  Result Date: 04/01/2017 INDICATION: 84 year old with endometrial cancer. Port-A-Cath needed for treatment. EXAM: FLUOROSCOPIC AND ULTRASOUND GUIDED PLACEMENT OF A SUBCUTANEOUS PORT COMPARISON:  None. MEDICATIONS: Ancef 2 g; The antibiotic was administered within an appropriate time interval prior to skin puncture. ANESTHESIA/SEDATION: The patient was continuously monitored during the procedure by the interventional radiology nurse under my direct supervision. Versed 1.0 mg IV; Fentanyl 50 mcg IV; Moderate Sedation Time:  36 minutes FLUOROSCOPY TIME:  Fluoroscopy Time: 18 seconds, 2 mGy COMPLICATIONS: None immediate. PROCEDURE: The procedure, risks, benefits, and alternatives were explained to the patient. Questions regarding the procedure were encouraged and answered. The patient understands and consents to the procedure. Patient was placed supine on the interventional table. Ultrasound confirmed a patent right internal jugular vein. The right chest and neck were cleaned with a skin antiseptic and a sterile drape was placed. Maximal barrier sterile technique was utilized including caps, mask, sterile gowns, sterile gloves, sterile drape, hand hygiene and skin antiseptic. The right neck was anesthetized with 1% lidocaine. Small incision was made in the right neck with a blade. Micropuncture set was placed in the right internal jugular vein with ultrasound guidance. The micropuncture wire was used for measurement purposes. The right chest was anesthetized with 1% lidocaine with epinephrine. #15 blade was used to make an incision and a subcutaneous port pocket was formed. [place] was assembled. Subcutaneous tunnel was formed with a stiff tunneling device. The port catheter was brought through the subcutaneous tunnel. The port was placed in the  subcutaneous pocket and sutured in place. The micropuncture set was exchanged for a peel-away sheath. The catheter was placed through the peel-away sheath and the tip was positioned at the superior cavoatrial junction. Catheter placement was confirmed with fluoroscopy. The port was accessed and flushed with heparinized saline. The port pocket was closed using two layers of absorbable sutures and Dermabond. The vein skin site was closed using a single layer of absorbable suture and Dermabond. Sterile dressings were applied. Patient tolerated the procedure well without an immediate complication. Ultrasound and fluoroscopic images were taken and saved for this procedure. IMPRESSION: Placement of a subcutaneous port device. Electronically Signed   By: Markus Daft M.D.   On: 04/01/2017 14:01    ASSESSMENT & PLAN:  Endometrial ca [facility]) She tolerated single agent carboplatin well without side effects I would alert her radiation oncologist to schedule radiation to start soon I will see her prior to cycle 3 of chemotherapy. We would proceed with treatment without dose adjustment  Paresthesia of bilateral legs The patient had a spinal cord injury many years ago She has baseline paresthesia affecting her lower extremities So far, chemotherapy has not affected her neuropathy and weakness  Anemia due to  antineoplastic chemotherapy This is likely due to recent treatment. The patient denies recent history of bleeding such as epistaxis, hematuria or hematochezia. She is asymptomatic from the anemia. I will observe for now.  She does not require transfusion now. I will continue the chemotherapy at current dose without dosage adjustment.   No orders of the defined types were placed in this encounter.  All questions were answered. The patient knows to call the clinic with any problems, questions or concerns. No barriers to learning was detected. I spent 15 minutes counseling the patient face to face. The total  time spent in the appointment was 20 minutes and more than 50% was on counseling and review of test results     Heath Lark, MD 04/28/2017 2:10 PM

## 2017-04-28 NOTE — Assessment & Plan Note (Signed)
She tolerated single agent carboplatin well without side effects I would alert her radiation oncologist to schedule radiation to start soon I will see her prior to cycle 3 of chemotherapy. We would proceed with treatment without dose adjustment

## 2017-05-12 ENCOUNTER — Inpatient Hospital Stay: Payer: Medicare Other | Attending: Genetic Counselor | Admitting: Genetic Counselor

## 2017-05-12 ENCOUNTER — Inpatient Hospital Stay: Payer: Medicare Other

## 2017-05-12 ENCOUNTER — Encounter: Payer: Self-pay | Admitting: Genetic Counselor

## 2017-05-12 DIAGNOSIS — Z800 Family history of malignant neoplasm of digestive organs: Secondary | ICD-10-CM | POA: Diagnosis not present

## 2017-05-12 DIAGNOSIS — C541 Malignant neoplasm of endometrium: Secondary | ICD-10-CM

## 2017-05-12 DIAGNOSIS — Z802 Family history of malignant neoplasm of other respiratory and intrathoracic organs: Secondary | ICD-10-CM

## 2017-05-12 DIAGNOSIS — Z809 Family history of malignant neoplasm, unspecified: Secondary | ICD-10-CM

## 2017-05-12 DIAGNOSIS — Z5111 Encounter for antineoplastic chemotherapy: Secondary | ICD-10-CM | POA: Insufficient documentation

## 2017-05-12 DIAGNOSIS — Z1379 Encounter for other screening for genetic and chromosomal anomalies: Secondary | ICD-10-CM

## 2017-05-12 DIAGNOSIS — R202 Paresthesia of skin: Secondary | ICD-10-CM | POA: Insufficient documentation

## 2017-05-12 DIAGNOSIS — D6481 Anemia due to antineoplastic chemotherapy: Secondary | ICD-10-CM | POA: Insufficient documentation

## 2017-05-12 HISTORY — DX: Encounter for other screening for genetic and chromosomal anomalies: Z13.79
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
## 2017-05-17 ENCOUNTER — Telehealth: Payer: Self-pay | Admitting: *Deleted

## 2017-05-17 NOTE — Telephone Encounter (Signed)
Called patient to remind of HDR [facility], spoke with patient's husband Oval Linsey and he is aware of these appts.

## 2017-05-18 ENCOUNTER — Ambulatory Visit
Admission: RE | Admit: 2017-05-18 | Discharge: 2017-05-18 | Disposition: A | Discharge status: Home / Self Care | Payer: Medicare Other | Source: Ambulatory Visit | Attending: Radiation Oncology | Admitting: Radiation Oncology

## 2017-05-18 ENCOUNTER — Encounter: Payer: Self-pay | Admitting: Radiation Oncology

## 2017-05-18 DIAGNOSIS — C541 Malignant neoplasm of endometrium: Secondary | ICD-10-CM

## 2017-05-18 NOTE — Progress Notes (Signed)
  Radiation Oncology         [phone] ________________________________  Name: Cindy Davies MRN: [id number]  Date: 05/18/2017  DOB: 09-Mar-1934  CC: Tower, [name], MD  Nancy Marus, MD  HDR BRACHYTHERAPY NOTE  DIAGNOSIS: Stage 1B uterine serous carcinoma    Simple treatment device note: Patient had construction of her custom vaginal cylinder. She will be treated with a 3 cm diameter segmented cylinder. This conforms to her anatomy without undue discomfort.  Vaginal brachytherapy procedure node: The patient was brought to the [place] suite. Identity was confirmed. All relevant records and images related to the planned course of therapy were reviewed. The patient freely provided informed written consent to proceed with treatment after reviewing the details related to the planned course of therapy. The consent form was witnessed and verified by the simulation staff. Then, the patient was set-up in a stable reproducible supine position for radiation therapy. Pelvic exam revealed the vaginal cuff to be intact. The patient's custom vaginal cylinder was placed in the proximal vagina. This was affixed to the CT/MR stabilization plate to prevent slippage. Patient tolerated the placement well.  Verification simulation note:  A fiducial marker was placed within the vaginal cylinder. An AP and lateral film was then obtained through the pelvis area. This documented accurate position of the vaginal cylinder for treatment.  HDR BRACHYTHERAPY TREATMENT  The remote afterloading device was affixed to the vaginal cylinder by catheter. Patient then proceeded to undergo her 1st high-dose-rate treatment directed at the proximal vagina. The patient was prescribed a dose of 6 gray to be delivered to the mucosal surface. Treatment length was 4 cm. Patient was treated with 1 channel using 9 dwell positions. Treatment time was 244.10 seconds. Iridium 192 was the high-dose-rate source for treatment. The patient  tolerated the treatment well. After completion of her therapy, a radiation survey was performed documenting return of the iridium source into the [employer]Med safe.   PLAN: Follow up in 1 week for 2nd HDR treatment ________________________________  Blair Promise, PhD, MD    This document serves as a record of services personally performed by Gery Pray, MD. It was created on his behalf by [facility], a trained medical scribe. The creation of this record is based on the scribe's personal observations and the provider's statements to them. This document has been checked and approved by the attending provider.

## 2017-05-18 NOTE — Progress Notes (Signed)
  Radiation Oncology         [phone] ________________________________  Name: Cindy Davies MRN: [id number]  Date: 05/18/2017  DOB: April 06, 1934  SIMULATION AND TREATMENT PLANNING NOTE HDR BRACHYTHERAPY  DIAGNOSIS:  Stage 1B uterine serous carcinoma  NARRATIVE:  The patient was brought to the [place].  Identity was confirmed.  All relevant records and images related to the planned course of therapy were reviewed.  The patient freely provided informed written consent to proceed with treatment after reviewing the details related to the planned course of therapy. The consent form was witnessed and verified by the simulation staff.  Then, the patient was set-up in a stable reproducible  supine position for radiation therapy.  CT images were obtained.  Surface markings were placed.  The CT images were loaded into the planning software.  Then the target and avoidance structures were contoured.  Treatment planning then occurred.  The radiation prescription was entered and confirmed.   I have requested : Brachytherapy Isodose Plan and Dosimetry Calculations to plan the radiation distribution.    PLAN:  The patient will receive 30 Gy in 5 fractions. She will be treated with a 3 cm diameter vaginal cylinder and a treatment length of 4 cm.  iridium 192 will be the high-dose-rate source.     ________________________________  Blair Promise, PhD, MD    This document serves as a record of services personally performed by Gery Pray, MD. It was created on his behalf by [facility], a trained medical scribe. The creation of this record is based on the scribe's personal observations and the provider's statements to them. This document has been checked and approved by the attending provider.

## 2017-05-18 NOTE — Progress Notes (Signed)
Cindy Davies is here for follow up/HDR.  She denies having any pain.  She denies having any urinary issues or bowel changes.  She also denies having any vaginal bleeding.  She last had chemotherapy about 2 weeks ago and said she has not had any side effects.  BP 138/61 (BP Location: Left Arm, Patient Position: Sitting)   Pulse 76   Temp 98.4 F (36.9 C) (Oral)   Ht 5\' 5"  (1.651 m)   Wt 165 lb 6.4 oz (75 kg)   SpO2 99%   BMI 27.52 kg/m    Wt Readings from Last 3 Encounters:  05/18/17 165 lb 6.4 oz (75 kg)  04/28/17 169 lb (76.7 kg)  04/13/17 166 lb 8 oz (75.5 kg)

## 2017-05-18 NOTE — Addendum Note (Signed)
Encounter addended by: Gery Pray, MD on: 05/18/2017 5:50 PM  Actions taken: LOS modified, Follow-up modified

## 2017-05-18 NOTE — Progress Notes (Signed)
Radiation Oncology         [phone] ________________________________  Name: Cindy Davies MRN: [id number]  Date: 05/18/2017  DOB: 09-22-1933  Vaginal Brachytherapy Procedure Note  CC: Tower, [name], MD Nancy Marus, MD    ICD-10-CM   1. Endometrial ca [facility]) C54.1     Diagnosis: Stage 1B uterine serous carcinoma  Narrative: She returns today for vaginal cylinder fitting. She is doing well overall. She is being followed by Medical Oncologist, Dr. Alvy Bimler and being treated with single agent carboplatin without acute side effects. She last had chemotherapy about 2 weeks ago and said she has not had any side effects.  On review of systems, she denies fatigue or increasing neuropathy. She denies having any pain, urinary issues, bowel changes, or vaginal bleeding.     ALLERGIES: has No Known Allergies.  Meds: Current Outpatient Medications  Medication Sig Dispense Refill  . aspirin EC 81 MG tablet Take 81 mg by mouth daily.    . fluticasone (FLONASE) 50 MCG/ACT nasal spray Place into the nose.    . lidocaine-prilocaine (EMLA) cream Apply to affected area once 30 g 3  . Misc. Devices (FOLDING [place]) MISC 1 Device by Does not apply route as needed (for use with ambulation as needed). Folding walker with seat and wide webbing for back support and safety for dx: M19.90 and Z91.81 (osteoarthritis and fall risk) 1 each 0  . Multiple Vitamins-Minerals (CENTRUM SILVER ADULT 50+ PO) Take 1 tablet by mouth daily.    Marland Kitchen PARoxetine (PAXIL) 20 MG tablet TAKE ONE TABLET BY MOUTH ONCE DAILY IN THE MORNING 90 tablet 3  . polyethylene glycol (MIRALAX / GLYCOLAX) packet Take 17 g by mouth daily.    . simvastatin (ZOCOR) 40 MG tablet TAKE ONE TABLET BY MOUTH EVERY DAY AT BEDTIME 90 tablet 3  . tolterodine (DETROL LA) 4 MG 24 hr capsule Take 1 capsule (4 mg total) by mouth daily. 90 capsule 3  . acetaminophen (TYLENOL) 325 MG tablet Take 650 mg by mouth.    . ondansetron (ZOFRAN) 8 MG tablet Take  1 tablet (8 mg total) by mouth 2 (two) times daily as needed for refractory nausea / vomiting. Start on day 3 after chemo. (Patient not taking: Reported on 05/18/2017) 30 tablet 1  . prochlorperazine (COMPAZINE) 10 MG tablet Take 1 tablet (10 mg total) by mouth every 6 (six) hours as needed (Nausea or vomiting). (Patient not taking: Reported on 05/18/2017) 30 tablet 1   No current facility-administered medications for this encounter.     Physical Findings: The patient is in no acute distress. Patient is alert and oriented.  height is 5\' 5"  (1.651 m) and weight is 165 lb 6.4 oz (75 kg). Her oral temperature is 98.4 F (36.9 C). Her blood pressure is 138/61 and her pulse is 76. Her oxygen saturation is 99%.   No palpable cervical, supraclavicular or axillary lymphoadenopathy. The heart has a regular rate and rhythm. The lungs are clear to auscultation. Abdomen soft and non-tender.  On pelvic examination the external genitalia were unremarkable. A speculum exam was performed. Vaginal cuff intact, no mucosal lesions. On bimanual exam there were no pelvic masses appreciated.  Lab Findings: Lab Results  Component Value Date   WBC 5.3 04/28/2017   HGB 11.0 (L) 04/28/2017   HCT 33.7 (L) 04/28/2017   MCV 94.4 04/28/2017   PLT 167 04/28/2017    Radiographic Findings: No results found.  Impression: This procedure has been fully reviewed with  the patient and written informed consent has been obtained.   Patient was fitted for a vaginal cylinder. The patient will be treated with a 3 cm diameter cylinder with a treatment length of 4 cm. This distended the vaginal vault without undue discomfort. The patient tolerated the procedure well.  The patient was successfully fitted for a vaginal cylinder. The patient is appropriate to begin vaginal brachytherapy.     Plan: The patient will proceed with CT simulation, planning and vaginal brachytherapy today.    _______________________________   Blair Promise, PhD, MD  This document serves as a record of services personally performed by Gery Pray, MD. It was created on his behalf by [facility], a trained medical scribe. The creation of this record is based on the scribe's personal observations and the provider's statements to them. This document has been checked and approved by the attending provider.

## 2017-05-19 ENCOUNTER — Inpatient Hospital Stay: Payer: Medicare Other

## 2017-05-19 ENCOUNTER — Inpatient Hospital Stay (HOSPITAL_BASED_OUTPATIENT_CLINIC_OR_DEPARTMENT_OTHER): Payer: Medicare Other | Admitting: Hematology and Oncology

## 2017-05-19 ENCOUNTER — Encounter: Payer: Self-pay | Admitting: Hematology and Oncology

## 2017-05-19 ENCOUNTER — Encounter: Payer: Self-pay | Admitting: Radiation Oncology

## 2017-05-19 DIAGNOSIS — T451X5A Adverse effect of antineoplastic and immunosuppressive drugs, initial encounter: Secondary | ICD-10-CM

## 2017-05-19 DIAGNOSIS — Z5111 Encounter for antineoplastic chemotherapy: Secondary | ICD-10-CM | POA: Diagnosis not present

## 2017-05-19 DIAGNOSIS — C541 Malignant neoplasm of endometrium: Secondary | ICD-10-CM

## 2017-05-19 DIAGNOSIS — D6481 Anemia due to antineoplastic chemotherapy: Secondary | ICD-10-CM | POA: Diagnosis not present

## 2017-05-19 DIAGNOSIS — R202 Paresthesia of skin: Secondary | ICD-10-CM | POA: Diagnosis not present

## 2017-05-19 LAB — CBC WITH DIFFERENTIAL/PLATELET
Basophils Absolute: 0 10*3/uL (ref 0.0–0.1)
Basophils Relative: 1 %
Eosinophils Absolute: 0.1 10*3/uL (ref 0.0–0.5)
Eosinophils Relative: 2 %
HEMATOCRIT: 32 % — AB (ref 34.8–46.6)
Hemoglobin: 10.8 g/dL — ABNORMAL LOW (ref 11.6–15.9)
LYMPHS ABS: 1.6 10*3/uL (ref 0.9–3.3)
LYMPHS PCT: 42 %
MCH: 32 pg (ref 25.1–34.0)
MCHC: 33.8 g/dL (ref 31.5–36.0)
MCV: 94.7 fL (ref 79.5–101.0)
MONOS PCT: 9 %
Monocytes Absolute: 0.3 10*3/uL (ref 0.1–0.9)
NEUTROS ABS: 1.8 10*3/uL (ref 1.5–6.5)
Neutrophils Relative %: 46 %
Platelets: 166 10*3/uL (ref 145–400)
RBC: 3.38 MIL/uL — AB (ref 3.70–5.45)
RDW: 15.2 % (ref 11.2–16.1)
WBC: 3.8 10*3/uL — AB (ref 3.9–10.3)

## 2017-05-19 LAB — COMPREHENSIVE METABOLIC PANEL
ALT: 9 U/L (ref 0–55)
ANION GAP: 10 (ref 3–11)
AST: 20 U/L (ref 5–34)
Albumin: 3.9 g/dL (ref 3.5–5.0)
Alkaline Phosphatase: 44 U/L (ref 40–150)
BUN: 15 mg/dL (ref 7–26)
CHLORIDE: 106 mmol/L (ref 98–109)
CO2: 25 mmol/L (ref 22–29)
Calcium: 9.1 mg/dL (ref 8.4–10.4)
Creatinine, Ser: 1.02 mg/dL (ref 0.60–1.10)
GFR, EST AFRICAN AMERICAN: 58 mL/min — AB (ref >60)
GFR, EST NON AFRICAN AMERICAN: 50 mL/min — AB (ref >60)
Glucose, Bld: 96 mg/dL (ref 70–140)
POTASSIUM: 4.2 mmol/L (ref 3.3–4.7)
SODIUM: 141 mmol/L (ref 136–145)
Total Bilirubin: 0.3 mg/dL (ref 0.2–1.2)
Total Protein: 7 g/dL (ref 6.4–8.3)

## 2017-05-19 MED ORDER — SODIUM CHLORIDE 0.9 % IV SOLN
400.0000 mg | Freq: Once | INTRAVENOUS | Status: AC
  Administered 2017-05-19: 400 mg via INTRAVENOUS
  Filled 2017-05-19: qty 40

## 2017-05-19 MED ORDER — HEPARIN SOD (PORK) LOCK FLUSH 100 UNIT/ML IV SOLN
500.0000 [IU] | Freq: Once | INTRAVENOUS | Status: DC | PRN
  Filled 2017-05-19: qty 5

## 2017-05-19 MED ORDER — SODIUM CHLORIDE 0.9% FLUSH
10.0000 mL | Freq: Once | INTRAVENOUS | Status: AC
  Administered 2017-05-19: 10 mL
  Filled 2017-05-19: qty 10

## 2017-05-19 MED ORDER — SODIUM CHLORIDE 0.9 % IV SOLN
Freq: Once | INTRAVENOUS | Status: AC
  Administered 2017-05-19: 14:00:00 via INTRAVENOUS
  Filled 2017-05-19: qty 5

## 2017-05-19 MED ORDER — PALONOSETRON HCL INJECTION 0.25 MG/5ML
INTRAVENOUS | Status: AC
  Filled 2017-05-19: qty 5

## 2017-05-19 MED ORDER — SODIUM CHLORIDE 0.9% FLUSH
10.0000 mL | INTRAVENOUS | Status: DC | PRN
  Filled 2017-05-19: qty 10

## 2017-05-19 MED ORDER — SODIUM CHLORIDE 0.9 % IV SOLN
Freq: Once | INTRAVENOUS | Status: AC
  Administered 2017-05-19: 14:00:00 via INTRAVENOUS

## 2017-05-19 MED ORDER — PALONOSETRON HCL INJECTION 0.25 MG/5ML
0.2500 mg | Freq: Once | INTRAVENOUS | Status: AC
  Administered 2017-05-19: 0.25 mg via INTRAVENOUS

## 2017-05-19 NOTE — Progress Notes (Signed)
[place] OFFICE PROGRESS NOTE  Patient Care Team: Tower, [name], MD as PCP - General Rutherford Guys, MD as Consulting Physician (Ophthalmology) Sydnee Levans, MD as Consulting Physician (Dermatology) Jerrell Belfast, MD as Consulting Physician (Otolaryngology) Barrie Dunker Nathaneil Canary, DMD as Consulting Physician (Dentistry) Druscilla Brownie, MD as Consulting Physician (Dermatology) Heloise Purpura, DDS as Consulting Physician (Dentistry)  SUMMARY OF ONCOLOGIC HISTORY: Oncology History   Serous, strong ER and PR, MSI high, decreased staining for hMLH1/hPMS2 Hypermethylation positive at [facility] (rules out Lynch syndrome)     Endometrial ca [facility])   02/02/2017 Initial Diagnosis    High grade Endometrial ca ([place])      02/21/2017 Imaging    1. No evidence of metastatic endometrial carcinoma in pelvis. Carcinoma appears confined to the uterus. 2. Ovaries are small and difficult to define. 3. No evidence of metastatic disease in the abdomen. 4. Single small LEFT lower lobe pulmonary nodule is nonspecific. Recommend attention on follow-up      03/04/2017 Surgery    Robotic hysterectomy, BSO and SLN. Stage as IB UPSC. 92% myometrial invasive, no LVSI, negative nodes. Pre-op CT negative.      03/04/2017 Pathology Results    ENDOMETRIUM(Endometrium - All Specimens)   SPECIMEN  Procedure:Simple hysterectomy   Procedure:Bilateral salpingo-oophorectomy   Hysterectomy Type:Laparoscopic, robotic-assisted   Specimen Integrity:Intact   TUMOR  Tumor Site:Endometrium   Histologic Type:Serous carcinoma   Tumor Size:Greatest dimension in Centimeters (cm): 2 Centimeters (cm)  Tumor Extent:  Myometrial Invasion:Present   Depth of Invasion in Millimeters (mm):12 Millimeters (mm)  Myometrial Thickness in Millimeters (mm):13 Millimeters (mm)  Percentage of Myometrial Invasion:92 %   Adenomyosis:Present, uninvolved by carcinoma   Uterine Serosa Involvement:Not identified   Lower Uterine Segment Involvement:Not identified   Cervical Stromal Involvement:Not identified   Other Tissue / Organ Involvement:Not identified   Peritoneal Ascitic Fluid:Not submitted / unknown   Accessory Findings:  Lymphovascular Invasion:Not identified   MARGINS  LYMPH NODES  Number of Pelvic Nodes with Macrometastasis:0   Number of Pelvic Nodes with Micrometastasis:0   Number of Pelvic Nodes with Isolated Tumor Cells:0   Total Number of Pelvic Node(s) Examined (sentinel and nonsentinel):2   Number of Pelvic Sentinel Nodes Examined:2   Laterality of Pelvic Node(s) Examined:Right   Laterality of Pelvic Node(s) Examined:Left   Number of Para-aortic Nodes with Macrometastasis:0   Number of Para-aortic Nodes with Micrometastasis:0   Number of Para-aortic Nodes with Isolated Tumor Cells:0   Total Number of Para-aortic Node(s) Examined (sentinel and nonsentinel):1   Number of Para-aortic Sentinel Nodes Examined:1   Laterality of Para-aortic Node(s) Examined:Right   PATHOLOGIC STAGE CLASSIFICATION (pTNM, AJCC 8th Edition)  Primary Tumor (pT):pT1b   Regional Lymph Nodes (pN):  Modifier:(sn)   Category (pN):pN0   FIGO STAGE  FIGO Stage:IB  Much of the tumor shows extensive tubule formation, but with high grade nuclei. The tubule forming areas merge with poorly differentiated solid tumor with similar high grade nuclei. Immunohistochemical stains were performed with the following results:   Immunostain   Tubule forming areas  Solid areas  Cytokeratin AE1/AE3  Diffusely positive  Diffusely positive  Vimentin   Focal reactivity   Few cells positive  PAX-8    Diffusely positive  Patchy positivity  p16    Patchy positivity    Diffusely positive  p53    Negative (null)   Negative (null)  Estrogen receptor  95%, strong   80%, moderate  Progesterone receptor  90%, strong   40%, weak to  moderate  Although there are differences in the immunostaining pattern between the tubule forming areas and the solid areas, the high grade nuclear features are similar and the p53 is completely negative (abnormal) in both areas. Based on these findings, the tumor is best classified as serous carcinoma.  Immunohistochemical stains for mismatch repair proteins performed on a representative block of tumor: Immunostains for mismatch repair proteins were performed on block D10 and show intact expression of MSH2 and MSH6 within the tumor. MLH1 and PMS2 are markedly decreased, but a few tumor cells are positive. This pattern does NOT support a diagnosis of microsatellite instability/hereditary non-polyposis colorectal cancer (HNPCC) associated carcinoma. Molecular testing for additional microsatellite instability markers will be performed by the [employer] [phone]) and reported separately.  SPECIMEN: Endometrium: Unstained slides/blocks received from [facility] with an original collection date of 03/04/2017 and a reported diagnosis of endometrial adenocarcinoma.    Tumor: [id number] D10 - Areas selected for macrodissection contain approximately 80% tumor nuclei. 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 Patients with MSI-H tumors are at elevated risk of having Lynch syndrome and carrying a heritable mutation in a mismatch-repair gene; however, the majority of these patients have a sporadic rather than inherited form of cancer (see comment).   This result is  consistent with the reported decreased staining in mismatch repair proteins MLH1 and PMS2 by immunohistochemistry ([id number]). MLH1 promoter methylation analysis is pending to determine the likelihood of Lynch syndrome in this patient and will be reported separately.  This result has implications for therapy selection, as MSI-H tumors are more likely to respond to immune modulating agents such as pembrolizumab Marin Comment DT, et al.  Alison Stalling J Med;372:2509-20, 2015).  COMMENT: The MSI-H phenotype is detected in approximately 90% of colorectal and endometrial carcinoma arising in patients with Lynch syndrome/ Hereditary Non-Polyposis Colorectal Cancer (HNPCC) and in 10-15% of colorectal carcinoma and 20-30% of endometrial cancer arising sporadically. This phenotype is associated with germline or somatic inactivation of DNA mismatch repair genes (MLH1, MSH2, MSH6, PMS2) which results in an inability to correct mismatches generated in microsatellite sequences during DNA replication (Peltomaki P., Tora Perches 76:[id number], 2003; Palomaki, GE, et al, Genetics in Medicine 11:42, 2009).     METHOD: DNA isolated from macrodissected paraffin-embedded slides of normal tissue and tumor tissue was PCR amplified across five mononucleotide microsatellite markers (BAT-25, BAT-26, NR-21, NR-24, and MONO-27) and analyzed by fluorescent capillary electrophoresis ([employer]ega).  Allelic profiles from the normal and tumor tissue were compared to determine MSI status at each of the five markers. MSI-H is designated when a detectable size difference exists in two or more of the five microsatellites tested.        04/01/2017 Procedure    Placement of a subcutaneous port device.      04/07/2017 -  Chemotherapy    The patient had single agent carboplatin      05/18/2017 -  Radiation Therapy    The patient had radiation therapy       INTERVAL HISTORY: Please see below for problem oriented charting. She returns for further  follow-up She denies significant side effects from treatment Denies nausea, vomiting or mucositis No changes in bowel habits No worsening weakness of peripheral neuropathy  REVIEW OF SYSTEMS:   Constitutional: Denies fevers, chills or  abnormal weight loss Eyes: Denies blurriness of vision Ears, nose, mouth, throat, and face: Denies mucositis or sore throat Respiratory: Denies cough, dyspnea or wheezes Cardiovascular: Denies palpitation, chest discomfort or lower extremity swelling Gastrointestinal:  Denies nausea, heartburn or change in bowel habits Skin: Denies abnormal skin rashes Lymphatics: Denies new lymphadenopathy or easy bruising Neurological:Denies numbness, tingling or new weaknesses Behavioral/Psych: Mood is stable, no new changes  All other systems were reviewed with the patient and are negative.  I have reviewed the past medical history, past surgical history, social history and family history with the patient and they are unchanged from previous note.  ALLERGIES:  has No Known Allergies.  MEDICATIONS:  Current Outpatient Medications  Medication Sig Dispense Refill  . acetaminophen (TYLENOL) 325 MG tablet Take 650 mg by mouth.    Marland Kitchen aspirin EC 81 MG tablet Take 81 mg by mouth daily.    . fluticasone (FLONASE) 50 MCG/ACT nasal spray Place into the nose.    . lidocaine-prilocaine (EMLA) cream Apply to affected area once 30 g 3  . Misc. Devices (FOLDING [place]) MISC 1 Device by Does not apply route as needed (for use with ambulation as needed). Folding walker with seat and wide webbing for back support and safety for dx: M19.90 and Z91.81 (osteoarthritis and fall risk) 1 each 0  . Multiple Vitamins-Minerals (CENTRUM SILVER ADULT 50+ PO) Take 1 tablet by mouth daily.    . ondansetron (ZOFRAN) 8 MG tablet Take 1 tablet (8 mg total) by mouth 2 (two) times daily as needed for refractory nausea / vomiting. Start on day 3 after chemo. (Patient not taking: Reported on 05/18/2017) 30  tablet 1  . PARoxetine (PAXIL) 20 MG tablet TAKE ONE TABLET BY MOUTH ONCE DAILY IN THE MORNING 90 tablet 3  . polyethylene glycol (MIRALAX / GLYCOLAX) packet Take 17 g by mouth daily.    . prochlorperazine (COMPAZINE) 10 MG tablet Take 1 tablet (10 mg total) by mouth every 6 (six) hours as needed (Nausea or vomiting). (Patient not taking: Reported on 05/18/2017) 30 tablet 1  . simvastatin (ZOCOR) 40 MG tablet TAKE ONE TABLET BY MOUTH EVERY DAY AT BEDTIME 90 tablet 3  . tolterodine (DETROL LA) 4 MG 24 hr capsule Take 1 capsule (4 mg total) by mouth daily. 90 capsule 3   No current facility-administered medications for this visit.     PHYSICAL EXAMINATION: ECOG PERFORMANCE STATUS: 1 - Symptomatic but completely ambulatory  Vitals:   05/19/17 1232  BP: 131/62  Pulse: 80  Resp: 18  Temp: 98.5 F (36.9 C)  SpO2: 98%   Filed Weights   05/19/17 1232  Weight: 168 lb 6.4 oz (76.4 kg)    GENERAL:alert, no distress and comfortable SKIN: skin color, texture, turgor are normal, no rashes or significant lesions EYES: normal, Conjunctiva are pink and non-injected, sclera clear OROPHARYNX:no exudate, no erythema and lips, buccal mucosa, and tongue normal  NECK: supple, thyroid normal size, non-tender, without nodularity LYMPH:  no palpable lymphadenopathy in the cervical, axillary or inguinal LUNGS: clear to auscultation and percussion with normal breathing effort HEART: regular rate & rhythm and no murmurs and no lower extremity edema ABDOMEN:abdomen soft, non-tender and normal bowel sounds Musculoskeletal:no cyanosis of digits and no clubbing  NEURO: alert & oriented x 3 with fluent speech, no focal motor/sensory deficits  LABORATORY DATA:  I have reviewed the data as listed    Component Value Date/Time   NA 142 04/28/2017 1207   K 4.5 04/28/2017 1207  CL 106 04/01/2017 1001   CO2 26 04/28/2017 1207   GLUCOSE 90 04/28/2017 1207   BUN 13.1 04/28/2017 1207   CREATININE 0.9 04/28/2017  1207   CALCIUM 9.2 04/28/2017 1207   PROT 6.8 04/28/2017 1207   ALBUMIN 3.9 04/28/2017 1207   AST 18 04/28/2017 1207   ALT 11 04/28/2017 1207   ALKPHOS 42 04/28/2017 1207   BILITOT 0.36 04/28/2017 1207   GFRNONAA 58 (L) 04/01/2017 1001   GFRAA >60 04/01/2017 1001    No results found for: SPEP, UPEP  Lab Results  Component Value Date   WBC 3.8 (L) 05/19/2017   NEUTROABS 1.8 05/19/2017   HGB 10.8 (L) 05/19/2017   HCT 32.0 (L) 05/19/2017   MCV 94.7 05/19/2017   PLT 166 05/19/2017      Chemistry      Component Value Date/Time   NA 142 04/28/2017 1207   K 4.5 04/28/2017 1207   CL 106 04/01/2017 1001   CO2 26 04/28/2017 1207   BUN 13.1 04/28/2017 1207   CREATININE 0.9 04/28/2017 1207      Component Value Date/Time   CALCIUM 9.2 04/28/2017 1207   ALKPHOS 42 04/28/2017 1207   AST 18 04/28/2017 1207   ALT 11 04/28/2017 1207   BILITOT 0.36 04/28/2017 1207     ASSESSMENT & PLAN:  Endometrial ca ([place]) She tolerated single agent carboplatin well without side effects I will proceed with cycle 3 of chemotherapy. We would proceed with treatment without dose adjustment  Anemia due to antineoplastic chemotherapy This is likely due to recent treatment. The patient denies recent history of bleeding such as epistaxis, hematuria or hematochezia. She is asymptomatic from the anemia. I will observe for now.  She does not require transfusion now. I will continue the chemotherapy at current dose without dosage adjustment.  Paresthesia of bilateral legs The patient had a spinal cord injury many years ago She has baseline paresthesia affecting her lower extremities So far, chemotherapy has not affected her neuropathy and weakness   No orders of the defined types were placed in this encounter.  All questions were answered. The patient knows to call the clinic with any problems, questions or concerns. No barriers to learning was detected. I spent 15 minutes counseling the patient face  to face. The total time spent in the appointment was 20 minutes and more than 50% was on counseling and review of test results     Heath Lark, MD 05/19/2017 12:37 PM

## 2017-05-19 NOTE — Assessment & Plan Note (Signed)
This is likely due to recent treatment. The patient denies recent history of bleeding such as epistaxis, hematuria or hematochezia. She is asymptomatic from the anemia. I will observe for now.  She does not require transfusion now. I will continue the chemotherapy at current dose without dosage adjustment.  

## 2017-05-19 NOTE — Assessment & Plan Note (Signed)
She tolerated single agent carboplatin well without side effects I will proceed with cycle 3 of chemotherapy. We would proceed with treatment without dose adjustment

## 2017-05-19 NOTE — Patient Instructions (Signed)
[facility] Discharge Instructions for Patients Receiving Chemotherapy  Today you received the following chemotherapy agents Carboplatin  To help prevent nausea and vomiting after your treatment, we encourage you to take your nausea medication as directed   If you develop nausea and vomiting that is not controlled by your nausea medication, call the clinic.   BELOW ARE SYMPTOMS THAT SHOULD BE REPORTED IMMEDIATELY:  *FEVER GREATER THAN 100.5 F  *CHILLS WITH OR WITHOUT FEVER  NAUSEA AND VOMITING THAT IS NOT CONTROLLED WITH YOUR NAUSEA MEDICATION  *UNUSUAL SHORTNESS OF BREATH  *UNUSUAL BRUISING OR BLEEDING  TENDERNESS IN MOUTH AND THROAT WITH OR WITHOUT PRESENCE OF ULCERS  *URINARY PROBLEMS  *BOWEL PROBLEMS  UNUSUAL RASH Items with * indicate a potential emergency and should be followed up as soon as possible.  Feel free to call the clinic should you have any questions or concerns. The clinic phone number is [phone].  Please show the CHEMO ALERT CARD at check-in to the Emergency Department and triage nurse.   

## 2017-05-19 NOTE — Assessment & Plan Note (Signed)
The patient had a spinal cord injury many years ago She has baseline paresthesia affecting her lower extremities So far, chemotherapy has not affected her neuropathy and weakness

## 2017-05-20 ENCOUNTER — Telehealth: Payer: Self-pay | Admitting: Hematology and Oncology

## 2017-05-20 ENCOUNTER — Encounter: Payer: Self-pay | Admitting: Radiation Oncology

## 2017-05-20 ENCOUNTER — Telehealth: Payer: Self-pay | Admitting: *Deleted

## 2017-05-20 NOTE — Telephone Encounter (Signed)
CALLED PATIENT TO REMIND OF HDR TX. FOR 05-23-17 @ 1 PM, LVM FOR A RETURN CALL

## 2017-05-20 NOTE — Telephone Encounter (Signed)
Return for no new orders or visits per 1/24 los.

## 2017-05-22 ENCOUNTER — Encounter: Payer: Self-pay | Admitting: Genetic Counselor

## 2017-05-22 NOTE — Progress Notes (Signed)
Cancer Genetics Clinic       Genetic Test Results    Patient Name: Cindy Davies Patient DOB: 01-18-1934 Patient Age: 84 y.o. Encounter Date: 05/23/2017  Referring Provider: Heath Lark, MD  Primary Care Provider: Abner Greenspan, MD   Cindy Davies was called today to discuss genetic test results. Please see the Genetics note from her visit on 05/12/2017 for a detailed discussion of her personal and family history.  Genetic Testing: At the time of Cindy Davies's visit, she decided to pursue genetic testing of multiple genes associated with hereditary susceptibility to cancer. Testing included sequencing and deletion/duplication analysis. Testing did not reveal any pathogenic mutation in any of these genes.  A copy of the genetic test report will be scanned into Epic under the Media tab.  The genes analyzed were the 83 genes on [facility]'s Multi-Cancer panel (ALK, APC, ATM, AXIN2, BAP1, BARD1, BLM, BMPR1A, BRCA1, BRCA2, BRIP1, CASR, CDC73, CDH1, CDK4, CDKN1B, CDKN1C, CDKN2A, CEBPA, CHEK2, CTNNA1, DICER1, DIS3L2, EGFR, EPCAM, FH, FLCN, GATA2, GPC3, GREM1, HOXB13, HRAS, KIT, MAX, MEN1, MET, MITF, MLH1, MSH2, MSH3, MSH6, MUTYH, NBN, NF1, NF2, NTHL1, PALB2, PDGFRA, PHOX2B, PMS2, POLD1, POLE, POT1, PRKAR1A, PTCH1, PTEN, RAD50, RAD51C, RAD51D, RB1, RECQL4, RET, RUNX1, SDHA, SDHAF2, SDHB, SDHC, SDHD, SMAD4, SMARCA4, SMARCB1, SMARCE1, STK11, SUFU, TERC, TERT, TMEM127, TP53, TSC1, TSC2, VHL, WRN, WT1).  Since the current test is not perfect, it is possible that there may be a gene mutation that current testing cannot detect, but that chance is small. It is possible that a different genetic factor, which has not yet been discovered or is not on this panel, is responsible for the cancer diagnoses in the family. Again, the likelihood of this is low. No additional testing is recommended at this time for Cindy Davies.  A Variant of Uncertain Significance was detected: MSH6 [id number]  (p.[id number]). This is still considered a normal result. While at this time, it is unknown if this finding is associated with increased cancer risk, the majority of these variants get reclassified to be inconsequential. Medical management should not be based on this finding. With time, we suspect the lab will determine the significance, if any. If we do learn more about it, we will try to contact Cindy Davies to discuss it further. It is important to stay in touch with [place] periodically and keep the address and phone number up to date.  Cancer Screening: These results suggest that Cindy Davies's cancer was most likely not due to an inherited predisposition. Most cancers happen by chance and this test, along with details of her family history, suggests that her cancer falls into this category. She is recommended to continue to follow the cancer screening guidelines provided by her physician.   Family Members: Family members are at some increased risk of developing cancer, over the general population risk, simply due to the family history. Women are recommended to have a yearly mammogram beginning at age 47, a yearly clinical breast exam, a yearly gynecologic exam and perform monthly breast self-exams. Colon cancer screening is recommended to begin by age 64 in both men and women, unless there is a family history of colon cancer or colon polyps or an individual has a personal history to warrant initiating screening at a younger age.  Any relative who had cancer at a young age or had a particularly rare cancer may also wish to pursue genetic testing. Genetic counselors  can be located in other cities, by visiting the website of the [employer] (ArtistMovie.se) and Field seismologist for a Dietitian by zip code.   Family members are not recommended to get tested for the above VUS outside of a research protocol as this finding has no implications for their medical management.  Lastly, cancer  genetics is a rapidly advancing field and it is possible that new genetic tests will be appropriate for Cindy Davies in the future. We encourage her to remain in contact with [place] on an annual basis so we can update her personal and family histories, and let her know of advances in cancer genetics that may benefit the family. Our contact number was provided. Cindy Davies is welcome to call anytime with additional questions.     Steele Berg, MS, [place] Certified Genetic Counselor phone: [phone]

## 2017-05-23 ENCOUNTER — Ambulatory Visit
Admission: RE | Admit: 2017-05-23 | Discharge: 2017-05-23 | Disposition: A | Discharge status: Home / Self Care | Payer: Medicare Other | Source: Ambulatory Visit | Attending: Radiation Oncology | Admitting: Radiation Oncology

## 2017-05-23 ENCOUNTER — Ambulatory Visit: Payer: Self-pay | Admitting: Genetic Counselor

## 2017-05-23 DIAGNOSIS — C541 Malignant neoplasm of endometrium: Secondary | ICD-10-CM

## 2017-05-23 DIAGNOSIS — Z1379 Encounter for other screening for genetic and chromosomal anomalies: Secondary | ICD-10-CM

## 2017-05-23 NOTE — Progress Notes (Signed)
  Radiation Oncology         [phone] ________________________________  Name: KYRIELLE URBANSKI MRN: [id number]  Date: 05/23/2017  DOB: 01/24/1934  CC: Tower, [name], MD  Nancy Marus, MD  HDR BRACHYTHERAPY NOTE  DIAGNOSIS: Stage 1B uterine serous carcinoma    Simple treatment device note: Patient had construction of her custom vaginal cylinder. She will be treated with a 3 cm diameter segmented cylinder. This conforms to her anatomy without undue discomfort.  Vaginal brachytherapy procedure node: The patient was brought to the [place] suite. Identity was confirmed. All relevant records and images related to the planned course of therapy were reviewed. The patient freely provided informed written consent to proceed with treatment after reviewing the details related to the planned course of therapy. The consent form was witnessed and verified by the simulation staff. Then, the patient was set-up in a stable reproducible supine position for radiation therapy. Pelvic exam revealed the vaginal cuff to be intact. The patient's custom vaginal cylinder was placed in the proximal vagina. This was affixed to the CT/MR stabilization plate to prevent slippage. Patient tolerated the placement well.  Verification simulation note:  A fiducial marker was placed within the vaginal cylinder. An AP and lateral film was then obtained through the pelvis area. This documented accurate position of the vaginal cylinder for treatment.  HDR BRACHYTHERAPY TREATMENT  The remote afterloading device was affixed to the vaginal cylinder by catheter. Patient then proceeded to undergo her 2nd high-dose-rate treatment directed at the proximal vagina. The patient was prescribed a dose of 6 gray to be delivered to the mucosal surface. Treatment length was 4 cm. Patient was treated with 1 channel using 9 dwell positions. Treatment time was 255.6 seconds. Iridium 192 was the high-dose-rate source for treatment. The patient  tolerated the treatment well. After completion of her therapy, a radiation survey was performed documenting return of the iridium source into the [employer] safe.   PLAN: Follow up in 1 week for 3rd HDR treatment ________________________________   Blair Promise, PhD, MD    This document serves as a record of services personally performed by Gery Pray, MD. It was created on his behalf by [facility], a trained medical scribe. The creation of this record is based on the scribe's personal observations and the provider's statements to them. This document has been checked and approved by the attending provider.

## 2017-05-24 ENCOUNTER — Telehealth: Payer: Self-pay | Admitting: *Deleted

## 2017-05-24 ENCOUNTER — Encounter: Payer: Self-pay | Admitting: Radiation Oncology

## 2017-05-24 NOTE — Telephone Encounter (Signed)
Called patient to remind of HDR Tx. for 05-25-17 @ 1 pm, spoke with patient and she is aware of tx.

## 2017-05-25 ENCOUNTER — Ambulatory Visit
Admission: RE | Admit: 2017-05-25 | Discharge: 2017-05-25 | Disposition: A | Discharge status: Home / Self Care | Payer: Medicare Other | Source: Ambulatory Visit | Attending: Radiation Oncology | Admitting: Radiation Oncology

## 2017-05-25 DIAGNOSIS — C541 Malignant neoplasm of endometrium: Secondary | ICD-10-CM

## 2017-05-25 NOTE — Progress Notes (Signed)
  Radiation Oncology         (336) [phone] ________________________________  Name: Cindy Davies MRN: [id number]  Date: 05/25/2017  DOB: 1933/06/19  CC: Tower, [name], MD  Nancy Marus, MD  HDR BRACHYTHERAPY NOTE  DIAGNOSIS: Stage 1B uterine serous carcinoma     Simple treatment device note: Patient had construction of her custom vaginal cylinder. She will be treated with a 3.0 cm diameter segmented cylinder. This conforms to her anatomy without undue discomfort.  Vaginal brachytherapy procedure node: The patient was brought to the [place] suite. Identity was confirmed. All relevant records and images related to the planned course of therapy were reviewed. The patient freely provided informed written consent to proceed with treatment after reviewing the details related to the planned course of therapy. The consent form was witnessed and verified by the simulation staff. Then, the patient was set-up in a stable reproducible supine position for radiation therapy. Pelvic exam revealed the vaginal cuff to be intact . The patient's custom vaginal cylinder was placed in the proximal vagina. This was affixed to the CT/MR stabilization plate to prevent slippage. Patient tolerated the placement well.  Verification simulation note:  A fiducial marker was placed within the vaginal cylinder. An AP and lateral film was then obtained through the pelvis area. This documented accurate position of the vaginal cylinder for treatment.  HDR BRACHYTHERAPY TREATMENT  The remote afterloading device was affixed to the vaginal cylinder by catheter. Patient then proceeded to undergo her third high-dose-rate treatment directed at the proximal vagina. The patient was prescribed a dose of 6.0 gray to be delivered to the mucosal surface. Treatment length was 4.0 cm. Patient was treated with 1 channel using 9 dwell positions. Treatment time was 260.7 seconds. Iridium 192 was the high-dose-rate source for treatment. The  patient tolerated the treatment well. After completion of her therapy, a radiation survey was performed documenting return of the iridium source into the GammaMed safe.   PLAN: the patient will return next week for her fourth and fifth treatment. ________________________________  Blair Promise, PhD, MD

## 2017-05-30 ENCOUNTER — Other Ambulatory Visit: Payer: Self-pay | Admitting: Family Medicine

## 2017-05-30 ENCOUNTER — Telehealth: Payer: Self-pay | Admitting: *Deleted

## 2017-05-30 NOTE — Telephone Encounter (Signed)
Called patient to remind of HDR Tx. for 05-31-17 @ 1 pm, lvm for a return call

## 2017-05-31 ENCOUNTER — Ambulatory Visit
Admission: RE | Admit: 2017-05-31 | Discharge: 2017-05-31 | Disposition: A | Discharge status: Home / Self Care | Payer: Medicare Other | Source: Ambulatory Visit | Attending: Radiation Oncology | Admitting: Radiation Oncology

## 2017-05-31 DIAGNOSIS — C541 Malignant neoplasm of endometrium: Secondary | ICD-10-CM | POA: Diagnosis not present

## 2017-05-31 NOTE — Progress Notes (Signed)
  Radiation Oncology         [phone] ________________________________  Name: Cindy Davies MRN: [id number]  Date: 05/31/2017  DOB: 09/04/1933  CC: Tower, [name], MD  Nancy Marus, MD  HDR BRACHYTHERAPY NOTE  DIAGNOSIS: Stage 1B uterine serous carcinoma     Simple treatment device note: Patient had construction of her custom vaginal cylinder. She will be treated with a 3.0 cm diameter segmented cylinder. This conforms to her anatomy without undue discomfort.  Vaginal brachytherapy procedure node: The patient was brought to the [place] suite. Identity was confirmed. All relevant records and images related to the planned course of therapy were reviewed. The patient freely provided informed written consent to proceed with treatment after reviewing the details related to the planned course of therapy. The consent form was witnessed and verified by the simulation staff. Then, the patient was set-up in a stable reproducible supine position for radiation therapy. Pelvic exam revealed the vaginal cuff to be intact . The patient's custom vaginal cylinder was placed in the proximal vagina. This was affixed to the CT/MR stabilization plate to prevent slippage. Patient tolerated the placement well.  Verification simulation note:  A fiducial marker was placed within the vaginal cylinder. An AP and lateral film was then obtained through the pelvis area. This documented accurate position of the vaginal cylinder for treatment.  HDR BRACHYTHERAPY TREATMENT  The remote afterloading device was affixed to the vaginal cylinder by catheter. Patient then proceeded to undergo her third high-dose-rate treatment directed at the proximal vagina. The patient was prescribed a dose of 6.0 gray to be delivered to the mucosal surface. Treatment length was 4 cm. Patient was treated with 1 channel using 9 dwell positions. Treatment time was 275.7 seconds. Iridium 192 was the high-dose-rate source for treatment. The patient  tolerated the treatment well. After completion of her therapy, a radiation survey was performed documenting return of the iridium source into the GammaMed safe.   PLAN: She will return in the next few days for her fourth high-dose rate treatment. ________________________________  Blair Promise, PhD, MD

## 2017-06-01 ENCOUNTER — Telehealth: Payer: Self-pay | Admitting: *Deleted

## 2017-06-01 NOTE — Telephone Encounter (Signed)
Called patient to remind of HDR Tx. for 06-02-17, lvm for a return call

## 2017-06-02 ENCOUNTER — Encounter: Payer: Self-pay | Admitting: Radiation Oncology

## 2017-06-02 ENCOUNTER — Ambulatory Visit
Admission: RE | Admit: 2017-06-02 | Discharge: 2017-06-02 | Disposition: A | Discharge status: Home / Self Care | Payer: Medicare Other | Source: Ambulatory Visit | Attending: Radiation Oncology | Admitting: Radiation Oncology

## 2017-06-02 DIAGNOSIS — C541 Malignant neoplasm of endometrium: Secondary | ICD-10-CM

## 2017-06-02 NOTE — Progress Notes (Signed)
  Radiation Oncology         [phone]) [phone] ________________________________  Name: DOHA BOLING MRN: [id number]  Date: 06/02/2017  DOB: 1933/09/11  CC: Tower, [name], MD  Nancy Marus, MD  HDR BRACHYTHERAPY NOTE  DIAGNOSIS: Stage 1B uterine serous carcinoma  Simple treatment device note: Patient had construction of her custom vaginal cylinder. She will be treated with a 3.0 cm diameter segmented cylinder. This conforms to her anatomy without undue discomfort.  Vaginal brachytherapy procedure node: The patient was brought to the [place] suite. Identity was confirmed. All relevant records and images related to the planned course of therapy were reviewed. The patient freely provided informed written consent to proceed with treatment after reviewing the details related to the planned course of therapy. The consent form was witnessed and verified by the simulation staff. Then, the patient was set-up in a stable reproducible supine position for radiation therapy. Pelvic exam revealed the vaginal cuff to be intact . The patient's custom vaginal cylinder was placed in the proximal vagina. This was affixed to the CT/MR stabilization plate to prevent slippage. Patient tolerated the placement well.  Verification simulation note:  A fiducial marker was placed within the vaginal cylinder. An AP and lateral film was then obtained through the pelvis area. This documented accurate position of the vaginal cylinder for treatment.  HDR BRACHYTHERAPY TREATMENT  The remote afterloading device was affixed to the vaginal cylinder by catheter. Patient then proceeded to undergo her fifth high-dose-rate treatment directed at the proximal vagina. The patient was prescribed a dose of 6.0 gray to be delivered to the mucosal surface. Treatment length was 4 cm. Patient was treated with 1 channel using 9 dwell positions. Treatment time was 280.8 seconds. Iridium 192 was the high-dose-rate source for treatment. The patient  tolerated the treatment well. After completion of her therapy, a radiation survey was performed documenting return of the iridium source into the GammaMed safe.   PLAN:  Patient has completed her vaginal brachytherapy. Overall she tolerated this quite well. She will return for routine follow-up in one month ________________________________  Blair Promise, PhD, MD   This document serves as a record of services personally performed by Gery Pray, MD. It was created on his behalf by Valeta Harms, a trained medical scribe. The creation of this record is based on the scribe's personal observations and the provider's statements to them. This document has been checked and approved by the attending provider.

## 2017-06-02 NOTE — Progress Notes (Signed)
  Radiation Oncology         [phone] ________________________________  Name: Cindy Davies MRN: [id number]  Date: 06/02/2017  DOB: 1933/11/08  End of Treatment Note  Diagnosis:  Stage 1B uterine serous carcinoma     Indication for treatment:  Curative, post-op, risk for vaginal cuff recurrence    Radiation treatment dates:   05/18/2017, 05/23/2017, 05/25/2017, 05/31/2017, 06/02/2017  Site/dose:  Proximal vagina/ 30 Gy in 5 treatments  Beams/energy:  Iridium 192 high-dose-rate, treatment length was 4 cm, 3.0 cm diameter segmented cylinder  Narrative: The patient tolerated radiation treatment relatively well. No GU or GI complaints during treatment.   Plan: The patient has completed radiation treatment. The patient will return to radiation oncology clinic for routine followup in one month. I advised them to call or return sooner if they have any questions or concerns related to their recovery or treatment.  -----------------------------------  Blair Promise, PhD, MD This document serves as a record of services personally performed by Gery Pray, MD. It was created on his behalf by Valeta Harms, a trained medical scribe. The creation of this record is based on the scribe's personal observations and the provider's statements to them. This document has been checked and approved by the attending provider.

## 2017-06-09 ENCOUNTER — Inpatient Hospital Stay: Payer: Medicare Other

## 2017-06-09 ENCOUNTER — Inpatient Hospital Stay: Payer: Medicare Other | Attending: Genetic Counselor | Admitting: Hematology and Oncology

## 2017-06-09 ENCOUNTER — Encounter: Payer: Self-pay | Admitting: Hematology and Oncology

## 2017-06-09 ENCOUNTER — Other Ambulatory Visit: Payer: Self-pay | Admitting: Hematology and Oncology

## 2017-06-09 ENCOUNTER — Telehealth: Payer: Self-pay | Admitting: Hematology and Oncology

## 2017-06-09 DIAGNOSIS — D6481 Anemia due to antineoplastic chemotherapy: Secondary | ICD-10-CM | POA: Diagnosis not present

## 2017-06-09 DIAGNOSIS — R202 Paresthesia of skin: Secondary | ICD-10-CM

## 2017-06-09 DIAGNOSIS — C541 Malignant neoplasm of endometrium: Secondary | ICD-10-CM

## 2017-06-09 DIAGNOSIS — T451X5A Adverse effect of antineoplastic and immunosuppressive drugs, initial encounter: Secondary | ICD-10-CM

## 2017-06-09 DIAGNOSIS — Z5111 Encounter for antineoplastic chemotherapy: Secondary | ICD-10-CM | POA: Insufficient documentation

## 2017-06-09 LAB — COMPREHENSIVE METABOLIC PANEL
ALBUMIN: 3.8 g/dL (ref 3.5–5.0)
ALT: 6 U/L (ref 0–55)
ANION GAP: 9 (ref 3–11)
AST: 16 U/L (ref 5–34)
Alkaline Phosphatase: 45 U/L (ref 40–150)
BUN: 15 mg/dL (ref 7–26)
CHLORIDE: 104 mmol/L (ref 98–109)
CO2: 26 mmol/L (ref 22–29)
Calcium: 8.9 mg/dL (ref 8.4–10.4)
Creatinine, Ser: 0.9 mg/dL (ref 0.60–1.10)
GFR calc non Af Amer: 58 mL/min — ABNORMAL LOW (ref >60)
GLUCOSE: 131 mg/dL (ref 70–140)
POTASSIUM: 4.2 mmol/L (ref 3.5–5.1)
SODIUM: 139 mmol/L (ref 136–145)
Total Bilirubin: 0.2 mg/dL (ref 0.2–1.2)
Total Protein: 6.8 g/dL (ref 6.4–8.3)

## 2017-06-09 LAB — CBC WITH DIFFERENTIAL/PLATELET
BASOS PCT: 1 %
Basophils Absolute: 0 10*3/uL (ref 0.0–0.1)
EOS ABS: 0 10*3/uL (ref 0.0–0.5)
EOS PCT: 0 %
HCT: 30.6 % — ABNORMAL LOW (ref 34.8–46.6)
Hemoglobin: 10.3 g/dL — ABNORMAL LOW (ref 11.6–15.9)
LYMPHS ABS: 1.2 10*3/uL (ref 0.9–3.3)
Lymphocytes Relative: 30 %
MCH: 32.6 pg (ref 25.1–34.0)
MCHC: 33.7 g/dL (ref 31.5–36.0)
MCV: 96.8 fL (ref 79.5–101.0)
MONOS PCT: 8 %
Monocytes Absolute: 0.3 10*3/uL (ref 0.1–0.9)
NEUTROS PCT: 61 %
Neutro Abs: 2.5 10*3/uL (ref 1.5–6.5)
Platelets: 146 10*3/uL (ref 145–400)
RBC: 3.16 MIL/uL — ABNORMAL LOW (ref 3.70–5.45)
RDW: 16.5 % — ABNORMAL HIGH (ref 11.2–14.5)
WBC: 4 10*3/uL (ref 3.9–10.3)

## 2017-06-09 MED ORDER — SODIUM CHLORIDE 0.9% FLUSH
10.0000 mL | INTRAVENOUS | Status: DC | PRN
  Administered 2017-06-09: 10 mL
  Filled 2017-06-09: qty 10

## 2017-06-09 MED ORDER — SODIUM CHLORIDE 0.9% FLUSH
10.0000 mL | Freq: Once | INTRAVENOUS | Status: AC
  Administered 2017-06-09: 10 mL
  Filled 2017-06-09: qty 10

## 2017-06-09 MED ORDER — SODIUM CHLORIDE 0.9 % IV SOLN
Freq: Once | INTRAVENOUS | Status: AC
  Administered 2017-06-09: 14:00:00 via INTRAVENOUS

## 2017-06-09 MED ORDER — HEPARIN SOD (PORK) LOCK FLUSH 100 UNIT/ML IV SOLN
500.0000 [IU] | Freq: Once | INTRAVENOUS | Status: AC | PRN
  Administered 2017-06-09: 500 [IU]
  Filled 2017-06-09: qty 5

## 2017-06-09 MED ORDER — SODIUM CHLORIDE 0.9 % IV SOLN
400.0000 mg | Freq: Once | INTRAVENOUS | Status: AC
  Administered 2017-06-09: 400 mg via INTRAVENOUS
  Filled 2017-06-09: qty 40

## 2017-06-09 MED ORDER — PALONOSETRON HCL INJECTION 0.25 MG/5ML
0.2500 mg | Freq: Once | INTRAVENOUS | Status: AC
  Administered 2017-06-09: 0.25 mg via INTRAVENOUS

## 2017-06-09 MED ORDER — PALONOSETRON HCL INJECTION 0.25 MG/5ML
INTRAVENOUS | Status: AC
  Filled 2017-06-09: qty 5

## 2017-06-09 MED ORDER — SODIUM CHLORIDE 0.9 % IV SOLN
Freq: Once | INTRAVENOUS | Status: AC
  Administered 2017-06-09: 14:00:00 via INTRAVENOUS
  Filled 2017-06-09: qty 5

## 2017-06-09 NOTE — Assessment & Plan Note (Signed)
This is likely due to recent treatment. The patient denies recent history of bleeding such as epistaxis, hematuria or hematochezia. She is asymptomatic from the anemia. I will observe for now.  She does not require transfusion now. I will continue the chemotherapy at current dose without dosage adjustment.  

## 2017-06-09 NOTE — Telephone Encounter (Signed)
Gave avs and calendar for march °

## 2017-06-09 NOTE — Assessment & Plan Note (Signed)
The patient had a spinal cord injury many years ago She has baseline paresthesia affecting her lower extremities So far, chemotherapy has not affected her neuropathy and weakness

## 2017-06-09 NOTE — Progress Notes (Signed)
[place] OFFICE PROGRESS NOTE  Patient Care Team: Tower, [name], MD as PCP - General Rutherford Guys, MD as Consulting Physician (Ophthalmology) Sydnee Levans, MD as Consulting Physician (Dermatology) Jerrell Belfast, MD as Consulting Physician (Otolaryngology) Barrie Dunker Nathaneil Canary, DMD as Consulting Physician (Dentistry) Druscilla Brownie, MD as Consulting Physician (Dermatology) Heloise Purpura, DDS as Consulting Physician (Dentistry)  ASSESSMENT & PLAN:  Endometrial ca [facility]) She tolerated single agent carboplatin well without side effects I will proceed with cycle 4 of chemotherapy. We would proceed with treatment without dose adjustment, for total of 6 cycles of treatment.  After that, we will get her port removed and have close follow-up with GYN oncologist.  Anemia due to antineoplastic chemotherapy This is likely due to recent treatment. The patient denies recent history of bleeding such as epistaxis, hematuria or hematochezia. She is asymptomatic from the anemia. I will observe for now.  She does not require transfusion now. I will continue the chemotherapy at current dose without dosage adjustment.  Paresthesia of bilateral legs The patient had a spinal cord injury many years ago She has baseline paresthesia affecting her lower extremities So far, chemotherapy has not affected her neuropathy and weakness   No orders of the defined types were placed in this encounter.   INTERVAL HISTORY: Please see below for problem oriented charting. She returns for cycle 4 of chemotherapy She has recently completed radiation therapy She has some fatigue and loose stool Denies recent nausea or vomiting Denies peripheral neuropathy from treatment.  SUMMARY OF ONCOLOGIC HISTORY: Oncology History   Serous, strong ER and PR, MSI high, decreased staining for hMLH1/hPMS2 Hypermethylation positive at [facility] (rules out Lynch syndrome)     Endometrial ca (Cindy Davies)   02/02/2017  Initial Diagnosis    High grade Endometrial ca ([place])      02/21/2017 Imaging    1. No evidence of metastatic endometrial carcinoma in pelvis. Carcinoma appears confined to the uterus. 2. Ovaries are small and difficult to define. 3. No evidence of metastatic disease in the abdomen. 4. Single small LEFT lower lobe pulmonary nodule is nonspecific. Recommend attention on follow-up      03/04/2017 Surgery    Robotic hysterectomy, BSO and SLN. Stage as IB UPSC. 92% myometrial invasive, no LVSI, negative nodes. Pre-op CT negative.      03/04/2017 Pathology Results    ENDOMETRIUM(Endometrium - All Specimens)   SPECIMEN  Procedure:Simple hysterectomy   Procedure:Bilateral salpingo-oophorectomy   Hysterectomy Type:Laparoscopic, robotic-assisted   Specimen Integrity:Intact   TUMOR  Tumor Site:Endometrium   Histologic Type:Serous carcinoma   Tumor Size:Greatest dimension in Centimeters (cm): 2 Centimeters (cm)  Tumor Extent:  Myometrial Invasion:Present   Depth of Invasion in Millimeters (mm):12 Millimeters (mm)  Myometrial Thickness in Millimeters (mm):13 Millimeters (mm)  Percentage of Myometrial Invasion:92 %  Adenomyosis:Present, uninvolved by carcinoma   Uterine Serosa Involvement:Not identified   Lower Uterine Segment Involvement:Not identified   Cervical Stromal Involvement:Not identified   Other Tissue / Organ Involvement:Not identified   Peritoneal Ascitic Fluid:Not submitted / unknown   Accessory Findings:  Lymphovascular Invasion:Not identified   MARGINS  LYMPH NODES  Number of Pelvic Nodes with Macrometastasis:0   Number of Pelvic Nodes with Micrometastasis:0   Number of Pelvic Nodes with Isolated Tumor Cells:0   Total Number of Pelvic Node(s) Examined (sentinel and nonsentinel):2   Number of  Pelvic Sentinel Nodes Examined:2   Laterality of Pelvic Node(s) Examined:Right   Laterality of Pelvic Node(s) Examined:Left   Number of Para-aortic Nodes with  Macrometastasis:0   Number of Para-aortic Nodes with Micrometastasis:0   Number of Para-aortic Nodes with Isolated Tumor Cells:0   Total Number of Para-aortic Node(s) Examined (sentinel and nonsentinel):1   Number of Para-aortic Sentinel Nodes Examined:1   Laterality of Para-aortic Node(s) Examined:Right   PATHOLOGIC STAGE CLASSIFICATION (pTNM, AJCC 8th Edition)  Primary Tumor (pT):pT1b   Regional Lymph Nodes (pN):  Modifier:(sn)   Category (pN):pN0   FIGO STAGE  FIGO Stage:IB  Much of the tumor shows extensive tubule formation, but with high grade nuclei. The tubule forming areas merge with poorly differentiated solid tumor with similar high grade nuclei. Immunohistochemical stains were performed with the following results:   Immunostain   Tubule forming areas  Solid areas  Cytokeratin AE1/AE3  Diffusely positive  Diffusely positive  Vimentin   Focal reactivity   Few cells positive  PAX-8    Diffusely positive  Patchy positivity  p16    Patchy positivity   Diffusely positive  p53    Negative (null)   Negative (null)  Estrogen receptor  95%, strong   80%, moderate  Progesterone receptor  90%, strong   40%, weak to moderate  Although there are differences in the immunostaining pattern between the tubule forming areas and the solid areas, the high grade nuclear features are similar and the p53 is completely negative (abnormal) in both areas. Based on these findings, the tumor is best classified as serous carcinoma.  Immunohistochemical stains for mismatch repair proteins performed on a representative block of tumor: Immunostains for mismatch repair proteins were performed on block D10 and show intact expression of MSH2 and MSH6 within the tumor.  MLH1 and PMS2 are markedly decreased, but a few tumor cells are positive. This pattern does NOT support a diagnosis of microsatellite instability/hereditary non-polyposis colorectal cancer (HNPCC) associated carcinoma. Molecular testing for additional microsatellite instability markers will be performed by the [employer] [phone]) and reported separately.  SPECIMEN: Endometrium: Unstained slides/blocks received from [facility] with an original collection date of 03/04/2017 and a reported diagnosis of endometrial adenocarcinoma.    Tumor: [id number] D10 - Areas selected for macrodissection contain approximately 80% tumor nuclei. 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 Patients with MSI-H tumors are at elevated risk of having Lynch syndrome and carrying a heritable mutation in a mismatch-repair gene; however, the majority of these patients have a sporadic rather than inherited form of cancer (see comment).   This result is consistent with the reported decreased staining in mismatch repair proteins MLH1 and PMS2 by immunohistochemistry ([id number]). MLH1 promoter methylation analysis is pending to determine the likelihood of Lynch syndrome in this patient and will be reported separately.  This result has implications for therapy selection, as MSI-H tumors are more likely to respond to immune modulating agents such as pembrolizumab Marin Comment DT, et al.  Alison Stalling J Med;372:2509-20, 2015).  COMMENT: The MSI-H phenotype is detected in approximately 90% of colorectal and endometrial carcinoma arising in patients with Lynch syndrome/ Hereditary Non-Polyposis Colorectal Cancer (HNPCC) and in 10-15% of colorectal carcinoma and  20-30% of endometrial cancer arising sporadically. This phenotype is associated with germline or somatic inactivation of DNA mismatch repair genes (MLH1, MSH2, MSH6, PMS2) which results in an inability to correct mismatches generated in microsatellite sequences during DNA replication (Peltomaki P., J  Rhina Brackett 19:3790-2409, 2003; Darnelle Maffucci, GE, et al, Genetics in Medicine 11:42, 2009).     METHOD: DNA isolated from macrodissected paraffin-embedded slides of normal tissue and tumor tissue was PCR amplified across five mononucleotide microsatellite markers (BAT-25, BAT-26, NR-21, NR-24, and MONO-27) and analyzed by fluorescent capillary electrophoresis (Promega).  Allelic profiles from the normal and tumor tissue were compared to determine MSI status at each of the five markers. MSI-H is designated when a detectable size difference exists in two or more of the five microsatellites tested.        04/01/2017 Procedure    Placement of a subcutaneous port device.      04/07/2017 -  Chemotherapy    The patient had single agent carboplatin      05/18/2017 -  Radiation Therapy    The patient had radiation therapy      05/23/2017 Genetic Testing    Multi-Cancer panel (83 genes) @ [facility] - No pathogenic mutations detected Variant of Uncertain Significance in MSH6  Genes Analyzed: 83 genes on [facility]'s Multi-Cancer panel (ALK, APC, ATM, AXIN2, BAP1, BARD1, BLM, BMPR1A, BRCA1, BRCA2, BRIP1, CASR, CDC73, CDH1, CDK4, CDKN1B, CDKN1C, CDKN2A, CEBPA, CHEK2, CTNNA1, DICER1, DIS3L2, EGFR, EPCAM, FH, FLCN, GATA2, GPC3, GREM1, HOXB13, HRAS, KIT, MAX, MEN1, MET, MITF, MLH1, MSH2, MSH3, MSH6, MUTYH, NBN, NF1, NF2, NTHL1, PALB2, PDGFRA, PHOX2B, PMS2, POLD1, POLE, POT1, PRKAR1A, PTCH1, PTEN, RAD50, RAD51C, RAD51D, RB1, RECQL4, RET, RUNX1, SDHA, SDHAF2, SDHB, SDHC, SDHD, SMAD4, SMARCA4, SMARCB1, SMARCE1, STK11, SUFU, TERC, TERT, TMEM127, TP53, TSC1, TSC2, VHL, WRN, WT1).        REVIEW OF SYSTEMS:   Constitutional:  Denies fevers, chills or abnormal weight loss Eyes: Denies blurriness of vision Ears, nose, mouth, throat, and face: Denies mucositis or sore throat Respiratory: Denies cough, dyspnea or wheezes Cardiovascular: Denies palpitation, chest discomfort or lower extremity swelling Gastrointestinal:  Denies nausea, heartburn  Skin: Denies abnormal skin rashes Lymphatics: Denies new lymphadenopathy or easy bruising Neurological:Denies numbness, tingling or new weaknesses Behavioral/Psych: Mood is stable, no new changes  All other systems were reviewed with the patient and are negative.  I have reviewed the past medical history, past surgical history, social history and family history with the patient and they are unchanged from previous note.  ALLERGIES:  has No Known Allergies.  MEDICATIONS:  Current Outpatient Medications  Medication Sig Dispense Refill  . acetaminophen (TYLENOL) 325 MG tablet Take 650 mg by mouth.    Marland Kitchen aspirin EC 81 MG tablet Take 81 mg by mouth daily.    . fluticasone (FLONASE) 50 MCG/ACT nasal spray Place into the nose.    . lidocaine-prilocaine (EMLA) cream Apply to affected area once 30 g 3  . Misc. Devices (FOLDING [place]) MISC 1 Device by Does not apply route as needed (for use with ambulation as needed). Folding walker with seat and wide webbing for back support and safety for dx: M19.90 and Z91.81 (osteoarthritis and fall risk) 1 each 0  . Multiple Vitamins-Minerals (CENTRUM SILVER ADULT 50+ PO) Take 1 tablet by mouth daily.    . ondansetron (ZOFRAN) 8 MG tablet Take 1 tablet (8 mg total) by mouth 2 (two) times daily as needed for refractory nausea / vomiting. Start on day 3 after chemo. (Patient not taking: Reported on 05/18/2017) 30 tablet 1  . PARoxetine (PAXIL) 20 MG tablet TAKE ONE TABLET BY MOUTH ONCE DAILY IN THE MORNING 90 tablet 3  . polyethylene glycol (MIRALAX / GLYCOLAX) packet Take 17 g by mouth daily.    Marland Kitchen  prochlorperazine (COMPAZINE) 10 MG tablet Take 1  tablet (10 mg total) by mouth every 6 (six) hours as needed (Nausea or vomiting). (Patient not taking: Reported on 05/18/2017) 30 tablet 1  . simvastatin (ZOCOR) 40 MG tablet TAKE ONE TABLET BY MOUTH EVERY DAY AT BEDTIME 90 tablet 3  . tolterodine (DETROL LA) 4 MG 24 hr capsule TAKE ONE CAPSULE BY MOUTH ONCE DAILY 90 capsule 3   No current facility-administered medications for this visit.     PHYSICAL EXAMINATION: ECOG PERFORMANCE STATUS: 1 - Symptomatic but completely ambulatory  Vitals:   06/09/17 1218  BP: 120/61  Pulse: 80  Resp: 18  Temp: 98.6 F (37 C)  SpO2: 100%   Filed Weights   06/09/17 1218  Weight: 166 lb 9.6 oz (75.6 kg)    GENERAL:alert, no distress and comfortable SKIN: skin color, texture, turgor are normal, no rashes or significant lesions EYES: normal, Conjunctiva are pink and non-injected, sclera clear OROPHARYNX:no exudate, no erythema and lips, buccal mucosa, and tongue normal  NECK: supple, thyroid normal size, non-tender, without nodularity LYMPH:  no palpable lymphadenopathy in the cervical, axillary or inguinal LUNGS: clear to auscultation and percussion with normal breathing effort HEART: regular rate & rhythm and no murmurs and no lower extremity edema ABDOMEN:abdomen soft, non-tender and normal bowel sounds Musculoskeletal:no cyanosis of digits and no clubbing  NEURO: alert & oriented x 3 with fluent speech, with chronic weakness from prior spinal cord injury, stable  LABORATORY DATA:  I have reviewed the data as listed    Component Value Date/Time   NA 139 06/09/2017 1147   NA 142 04/28/2017 1207   K 4.2 06/09/2017 1147   K 4.5 04/28/2017 1207   CL 104 06/09/2017 1147   CO2 26 06/09/2017 1147   CO2 26 04/28/2017 1207   GLUCOSE 131 06/09/2017 1147   GLUCOSE 90 04/28/2017 1207   BUN 15 06/09/2017 1147   BUN 13.1 04/28/2017 1207   CREATININE 0.90 06/09/2017 1147   CREATININE 0.9 04/28/2017 1207   CALCIUM 8.9 06/09/2017 1147   CALCIUM 9.2  04/28/2017 1207   PROT 6.8 06/09/2017 1147   PROT 6.8 04/28/2017 1207   ALBUMIN 3.8 06/09/2017 1147   ALBUMIN 3.9 04/28/2017 1207   AST 16 06/09/2017 1147   AST 18 04/28/2017 1207   ALT 6 06/09/2017 1147   ALT 11 04/28/2017 1207   ALKPHOS 45 06/09/2017 1147   ALKPHOS 42 04/28/2017 1207   BILITOT 0.2 06/09/2017 1147   BILITOT 0.36 04/28/2017 1207   GFRNONAA 58 (L) 06/09/2017 1147   GFRAA >60 06/09/2017 1147    No results found for: SPEP, UPEP  Lab Results  Component Value Date   WBC 4.0 06/09/2017   NEUTROABS 2.5 06/09/2017   HGB 10.3 (L) 06/09/2017   HCT 30.6 (L) 06/09/2017   MCV 96.8 06/09/2017   PLT 146 06/09/2017      Chemistry      Component Value Date/Time   NA 139 06/09/2017 1147   NA 142 04/28/2017 1207   K 4.2 06/09/2017 1147   K 4.5 04/28/2017 1207   CL 104 06/09/2017 1147   CO2 26 06/09/2017 1147   CO2 26 04/28/2017 1207   BUN 15 06/09/2017 1147   BUN 13.1 04/28/2017 1207   CREATININE 0.90 06/09/2017 1147   CREATININE 0.9 04/28/2017 1207      Component Value Date/Time   CALCIUM 8.9 06/09/2017 1147   CALCIUM 9.2 04/28/2017 1207   ALKPHOS 45 06/09/2017 1147  ALKPHOS 42 04/28/2017 1207   AST 16 06/09/2017 1147   AST 18 04/28/2017 1207   ALT 6 06/09/2017 1147   ALT 11 04/28/2017 1207   BILITOT 0.2 06/09/2017 1147   BILITOT 0.36 04/28/2017 1207      All questions were answered. The patient knows to call the clinic with any problems, questions or concerns. No barriers to learning was detected.  I spent 15 minutes counseling the patient face to face. The total time spent in the appointment was 20 minutes and more than 50% was on counseling and review of test results  Heath Lark, MD 06/09/2017 1:13 PM

## 2017-06-09 NOTE — Patient Instructions (Signed)
[facility] Discharge Instructions for Patients Receiving Chemotherapy  Today you received the following chemotherapy agents Carboplatin  To help prevent nausea and vomiting after your treatment, we encourage you to take your nausea medication as directed   If you develop nausea and vomiting that is not controlled by your nausea medication, call the clinic.   BELOW ARE SYMPTOMS THAT SHOULD BE REPORTED IMMEDIATELY:  *FEVER GREATER THAN 100.5 F  *CHILLS WITH OR WITHOUT FEVER  NAUSEA AND VOMITING THAT IS NOT CONTROLLED WITH YOUR NAUSEA MEDICATION  *UNUSUAL SHORTNESS OF BREATH  *UNUSUAL BRUISING OR BLEEDING  TENDERNESS IN MOUTH AND THROAT WITH OR WITHOUT PRESENCE OF ULCERS  *URINARY PROBLEMS  *BOWEL PROBLEMS  UNUSUAL RASH Items with * indicate a potential emergency and should be followed up as soon as possible.  Feel free to call the clinic should you have any questions or concerns. The clinic phone number is [phone].  Please show the CHEMO ALERT CARD at check-in to the Emergency Department and triage nurse.   

## 2017-06-09 NOTE — Assessment & Plan Note (Signed)
She tolerated single agent carboplatin well without side effects I will proceed with cycle 4 of chemotherapy. We would proceed with treatment without dose adjustment, for total of 6 cycles of treatment.  After that, we will get her port removed and have close follow-up with GYN oncologist.

## 2017-06-30 ENCOUNTER — Encounter: Payer: Self-pay | Admitting: Hematology and Oncology

## 2017-06-30 ENCOUNTER — Inpatient Hospital Stay: Payer: Medicare Other | Attending: Genetic Counselor

## 2017-06-30 ENCOUNTER — Ambulatory Visit: Payer: Medicare Other

## 2017-06-30 ENCOUNTER — Inpatient Hospital Stay: Payer: Medicare Other

## 2017-06-30 ENCOUNTER — Inpatient Hospital Stay (HOSPITAL_BASED_OUTPATIENT_CLINIC_OR_DEPARTMENT_OTHER): Payer: Medicare Other | Admitting: Hematology and Oncology

## 2017-06-30 DIAGNOSIS — Z5111 Encounter for antineoplastic chemotherapy: Secondary | ICD-10-CM | POA: Diagnosis not present

## 2017-06-30 DIAGNOSIS — R202 Paresthesia of skin: Secondary | ICD-10-CM | POA: Diagnosis not present

## 2017-06-30 DIAGNOSIS — T451X5A Adverse effect of antineoplastic and immunosuppressive drugs, initial encounter: Secondary | ICD-10-CM

## 2017-06-30 DIAGNOSIS — C541 Malignant neoplasm of endometrium: Secondary | ICD-10-CM | POA: Diagnosis present

## 2017-06-30 DIAGNOSIS — D6481 Anemia due to antineoplastic chemotherapy: Secondary | ICD-10-CM | POA: Diagnosis not present

## 2017-06-30 LAB — COMPREHENSIVE METABOLIC PANEL
ALK PHOS: 42 U/L (ref 40–150)
ALT: 8 U/L (ref 0–55)
AST: 18 U/L (ref 5–34)
Albumin: 3.9 g/dL (ref 3.5–5.0)
Anion gap: 9 (ref 3–11)
BUN: 14 mg/dL (ref 7–26)
CALCIUM: 9.4 mg/dL (ref 8.4–10.4)
CO2: 25 mmol/L (ref 22–29)
CREATININE: 0.95 mg/dL (ref 0.60–1.10)
Chloride: 105 mmol/L (ref 98–109)
GFR calc non Af Amer: 54 mL/min — ABNORMAL LOW (ref >60)
GLUCOSE: 136 mg/dL (ref 70–140)
Potassium: 4.2 mmol/L (ref 3.5–5.1)
SODIUM: 139 mmol/L (ref 136–145)
Total Bilirubin: 0.3 mg/dL (ref 0.2–1.2)
Total Protein: 7 g/dL (ref 6.4–8.3)

## 2017-06-30 LAB — CBC WITH DIFFERENTIAL/PLATELET
BASOS ABS: 0 10*3/uL (ref 0.0–0.1)
Basophils Relative: 1 %
EOS ABS: 0 10*3/uL (ref 0.0–0.5)
Eosinophils Relative: 1 %
HCT: 30.8 % — ABNORMAL LOW (ref 34.8–46.6)
HEMOGLOBIN: 10.3 g/dL — AB (ref 11.6–15.9)
LYMPHS ABS: 1 10*3/uL (ref 0.9–3.3)
Lymphocytes Relative: 24 %
MCH: 33.2 pg (ref 25.1–34.0)
MCHC: 33.5 g/dL (ref 31.5–36.0)
MCV: 99.1 fL (ref 79.5–101.0)
Monocytes Absolute: 0.4 10*3/uL (ref 0.1–0.9)
Monocytes Relative: 9 %
NEUTROS PCT: 65 %
Neutro Abs: 2.9 10*3/uL (ref 1.5–6.5)
Platelets: 187 10*3/uL (ref 145–400)
RBC: 3.11 MIL/uL — AB (ref 3.70–5.45)
RDW: 19.1 % — ABNORMAL HIGH (ref 11.2–14.5)
WBC: 4.4 10*3/uL (ref 3.9–10.3)

## 2017-06-30 MED ORDER — PALONOSETRON HCL INJECTION 0.25 MG/5ML
INTRAVENOUS | Status: AC
  Filled 2017-06-30: qty 5

## 2017-06-30 MED ORDER — SODIUM CHLORIDE 0.9 % IV SOLN
Freq: Once | INTRAVENOUS | Status: AC
  Administered 2017-06-30: 15:00:00 via INTRAVENOUS

## 2017-06-30 MED ORDER — SODIUM CHLORIDE 0.9% FLUSH
10.0000 mL | Freq: Once | INTRAVENOUS | Status: AC
  Administered 2017-06-30: 10 mL
  Filled 2017-06-30: qty 10

## 2017-06-30 MED ORDER — HEPARIN SOD (PORK) LOCK FLUSH 100 UNIT/ML IV SOLN
500.0000 [IU] | Freq: Once | INTRAVENOUS | Status: AC | PRN
  Administered 2017-06-30: 500 [IU]
  Filled 2017-06-30: qty 5

## 2017-06-30 MED ORDER — PALONOSETRON HCL INJECTION 0.25 MG/5ML
0.2500 mg | Freq: Once | INTRAVENOUS | Status: AC
  Administered 2017-06-30: 0.25 mg via INTRAVENOUS

## 2017-06-30 MED ORDER — SODIUM CHLORIDE 0.9 % IV SOLN
400.0000 mg | Freq: Once | INTRAVENOUS | Status: AC
  Administered 2017-06-30: 400 mg via INTRAVENOUS
  Filled 2017-06-30: qty 40

## 2017-06-30 MED ORDER — SODIUM CHLORIDE 0.9 % IV SOLN
Freq: Once | INTRAVENOUS | Status: AC
  Administered 2017-06-30: 15:00:00 via INTRAVENOUS
  Filled 2017-06-30: qty 5

## 2017-06-30 MED ORDER — SODIUM CHLORIDE 0.9% FLUSH
10.0000 mL | INTRAVENOUS | Status: DC | PRN
Start: 2017-06-30 — End: 2017-06-30
  Administered 2017-06-30: 10 mL
  Filled 2017-06-30: qty 10

## 2017-06-30 NOTE — Assessment & Plan Note (Signed)
This is likely due to recent treatment. The patient denies recent history of bleeding such as epistaxis, hematuria or hematochezia. She is asymptomatic from the anemia. I will observe for now.  She does not require transfusion now. I will continue the chemotherapy at current dose without dosage adjustment.  

## 2017-06-30 NOTE — Patient Instructions (Signed)
[place] Discharge Instructions for Patients Receiving Chemotherapy  Today you received the following chemotherapy agents: Carboplatin (Paraplatin).  To help prevent nausea and vomiting after your treatment, we encourage you to take your nausea medication as prescribed. Received Aloxi during treatment-->take Compazine (not Zofran) for the next 3 days.  If you develop nausea and vomiting that is not controlled by your nausea medication, call the clinic.   BELOW ARE SYMPTOMS THAT SHOULD BE REPORTED IMMEDIATELY:  *FEVER GREATER THAN 100.5 F  *CHILLS WITH OR WITHOUT FEVER  NAUSEA AND VOMITING THAT IS NOT CONTROLLED WITH YOUR NAUSEA MEDICATION  *UNUSUAL SHORTNESS OF BREATH  *UNUSUAL BRUISING OR BLEEDING  TENDERNESS IN MOUTH AND THROAT WITH OR WITHOUT PRESENCE OF ULCERS  *URINARY PROBLEMS  *BOWEL PROBLEMS  UNUSUAL RASH Items with * indicate a potential emergency and should be followed up as soon as possible.  Feel free to call the clinic should you have any questions or concerns. The clinic phone number is [phone].  Please show the [place] at check-in to the Emergency Department and triage nurse.

## 2017-06-30 NOTE — Assessment & Plan Note (Signed)
The patient had a spinal cord injury many years ago She has baseline paresthesia affecting her lower extremities So far, chemotherapy has not affected her neuropathy and weakness

## 2017-06-30 NOTE — Progress Notes (Signed)
[place] OFFICE PROGRESS NOTE  Patient Care Team: Tower, [name], MD as PCP - General Rutherford Guys, MD as Consulting Physician (Ophthalmology) Sydnee Levans, MD as Consulting Physician (Dermatology) Jerrell Belfast, MD as Consulting Physician (Otolaryngology) Barrie Dunker Nathaneil Canary, DMD as Consulting Physician (Dentistry) Druscilla Brownie, MD as Consulting Physician (Dermatology) Heloise Purpura, DDS as Consulting Physician (Dentistry)  ASSESSMENT & PLAN:  Endometrial ca [facility]) She tolerated single agent carboplatin well without side effects I will proceed with cycle 5 of chemotherapy. We would proceed with treatment without dose adjustment, for total of 6 cycles of treatment.  After that, we will get her port removed and have close follow-up with GYN oncologist.  Anemia due to antineoplastic chemotherapy This is likely due to recent treatment. The patient denies recent history of bleeding such as epistaxis, hematuria or hematochezia. She is asymptomatic from the anemia. I will observe for now.  She does not require transfusion now. I will continue the chemotherapy at current dose without dosage adjustment.  Paresthesia of bilateral legs The patient had a spinal cord injury many years ago She has baseline paresthesia affecting her lower extremities So far, chemotherapy has not affected her neuropathy and weakness   No orders of the defined types were placed in this encounter.   INTERVAL HISTORY: Please see below for problem oriented charting. She returns for cycle 5 of chemotherapy She has completed recent radiation treatment She denies side effects from treatment No recent nausea or vomiting No worsening peripheral neuropathy or weakness  SUMMARY OF ONCOLOGIC HISTORY: Oncology History   Serous, strong ER and PR, MSI high, decreased staining for hMLH1/hPMS2 Hypermethylation positive at [facility] (rules out Lynch syndrome)     Endometrial ca ([place])   02/02/2017  Initial Diagnosis    High grade Endometrial ca ([place])      02/21/2017 Imaging    1. No evidence of metastatic endometrial carcinoma in pelvis. Carcinoma appears confined to the uterus. 2. Ovaries are small and difficult to define. 3. No evidence of metastatic disease in the abdomen. 4. Single small LEFT lower lobe pulmonary nodule is nonspecific. Recommend attention on follow-up      03/04/2017 Surgery    Robotic hysterectomy, BSO and SLN. Stage as IB UPSC. 92% myometrial invasive, no LVSI, negative nodes. Pre-op CT negative.      03/04/2017 Pathology Results    ENDOMETRIUM(Endometrium - All Specimens)   SPECIMEN  Procedure:Simple hysterectomy   Procedure:Bilateral salpingo-oophorectomy   Hysterectomy Type:Laparoscopic, robotic-assisted   Specimen Integrity:Intact   TUMOR  Tumor Site:Endometrium   Histologic Type:Serous carcinoma   Tumor Size:Greatest dimension in Centimeters (cm): 2 Centimeters (cm)  Tumor Extent:  Myometrial Invasion:Present   Depth of Invasion in Millimeters (mm):12 Millimeters (mm)  Myometrial Thickness in Millimeters (mm):13 Millimeters (mm)  Percentage of Myometrial Invasion:92 %  Adenomyosis:Present, uninvolved by carcinoma   Uterine Serosa Involvement:Not identified   Lower Uterine Segment Involvement:Not identified   Cervical Stromal Involvement:Not identified   Other Tissue / Organ Involvement:Not identified   Peritoneal Ascitic Fluid:Not submitted / unknown   Accessory Findings:  Lymphovascular Invasion:Not identified   MARGINS  LYMPH NODES  Number of Pelvic Nodes with Macrometastasis:0   Number of Pelvic Nodes with Micrometastasis:0   Number of Pelvic Nodes with Isolated Tumor Cells:0   Total Number of Pelvic Node(s) Examined (sentinel and nonsentinel):2   Number of  Pelvic Sentinel Nodes Examined:2   Laterality of Pelvic Node(s) Examined:Right   Laterality of Pelvic Node(s) Examined:Left   Number of Para-aortic Nodes with  Macrometastasis:0   Number of Para-aortic Nodes with Micrometastasis:0   Number of Para-aortic Nodes with Isolated Tumor Cells:0   Total Number of Para-aortic Node(s) Examined (sentinel and nonsentinel):1   Number of Para-aortic Sentinel Nodes Examined:1   Laterality of Para-aortic Node(s) Examined:Right   PATHOLOGIC STAGE CLASSIFICATION (pTNM, AJCC 8th Edition)  Primary Tumor (pT):pT1b   Regional Lymph Nodes (pN):  Modifier:(sn)   Category (pN):pN0   FIGO STAGE  FIGO Stage:IB  Much of the tumor shows extensive tubule formation, but with high grade nuclei. The tubule forming areas merge with poorly differentiated solid tumor with similar high grade nuclei. Immunohistochemical stains were performed with the following results:   Immunostain   Tubule forming areas  Solid areas  Cytokeratin AE1/AE3  Diffusely positive  Diffusely positive  Vimentin   Focal reactivity   Few cells positive  PAX-8    Diffusely positive  Patchy positivity  p16    Patchy positivity   Diffusely positive  p53    Negative (null)   Negative (null)  Estrogen receptor  95%, strong   80%, moderate  Progesterone receptor  90%, strong   40%, weak to moderate  Although there are differences in the immunostaining pattern between the tubule forming areas and the solid areas, the high grade nuclear features are similar and the p53 is completely negative (abnormal) in both areas. Based on these findings, the tumor is best classified as serous carcinoma.  Immunohistochemical stains for mismatch repair proteins performed on a representative block of tumor: Immunostains for mismatch repair proteins were performed on block D10 and show intact expression of MSH2 and MSH6 within the tumor.  MLH1 and PMS2 are markedly decreased, but a few tumor cells are positive. This pattern does NOT support a diagnosis of microsatellite instability/hereditary non-polyposis colorectal cancer (HNPCC) associated carcinoma. Molecular testing for additional microsatellite instability markers will be performed by the [employer] [phone]) and reported separately.  SPECIMEN: Endometrium: Unstained slides/blocks received from [facility] with an original collection date of 03/04/2017 and a reported diagnosis of endometrial adenocarcinoma.    Tumor: [id number] D10 - Areas selected for macrodissection contain approximately 80% tumor nuclei. 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 Patients with MSI-H tumors are at elevated risk of having Lynch syndrome and carrying a heritable mutation in a mismatch-repair gene; however, the majority of these patients have a sporadic rather than inherited form of cancer (see comment).   This result is consistent with the reported decreased staining in mismatch repair proteins MLH1 and PMS2 by immunohistochemistry ([id number]). MLH1 promoter methylation analysis is pending to determine the likelihood of Lynch syndrome in this patient and will be reported separately.  This result has implications for therapy selection, as MSI-H tumors are more likely to respond to immune modulating agents such as pembrolizumab Marin Comment DT, et al.  Alison Stalling J Med;[id number], 2015).  COMMENT: The MSI-H phenotype is detected in approximately 90% of colorectal and endometrial carcinoma arising in patients with Lynch syndrome/ Hereditary Non-Polyposis Colorectal Cancer (HNPCC) and in 10-15% of colorectal carcinoma and  20-30% of endometrial cancer arising sporadically. This phenotype is associated with germline or somatic inactivation of DNA mismatch repair genes (MLH1, MSH2, MSH6, PMS2) which results in an inability to correct mismatches generated in microsatellite sequences during DNA replication (Peltomaki P., J  Rhina Brackett [id number], 2003; Darnelle Maffucci, GE, et al, Genetics in Medicine 11:42, 2009).     METHOD: DNA isolated from macrodissected paraffin-embedded slides of normal tissue and tumor tissue was PCR amplified across five mononucleotide microsatellite markers (BAT-25, BAT-26, NR-21, NR-24, and MONO-27) and analyzed by fluorescent capillary electrophoresis (Promega).  Allelic profiles from the normal and tumor tissue were compared to determine MSI status at each of the five markers. MSI-H is designated when a detectable size difference exists in two or more of the five microsatellites tested.        04/01/2017 Procedure    Placement of a subcutaneous port device.      04/07/2017 -  Chemotherapy    The patient had single agent carboplatin      05/18/2017 - 06/02/2017 Radiation Therapy    The patient had radiation therapy Radiation treatment dates:   05/18/2017, 05/23/2017, 05/25/2017, 05/31/2017, 06/02/2017  Site/dose:  Proximal vagina/ 30 Gy in 5 treatments  Beams/energy:  Iridium 192 high-dose-rate, treatment length was 4 cm, 3.0 cm diameter segmented cylinder      05/23/2017 Genetic Testing    Multi-Cancer panel (83 genes) @ [facility] - No pathogenic mutations detected Variant of Uncertain Significance in MSH6  Genes Analyzed: 83 genes on [facility]'s Multi-Cancer panel (ALK, APC, ATM, AXIN2, BAP1, BARD1, BLM, BMPR1A, BRCA1, BRCA2, BRIP1, CASR, CDC73, CDH1, CDK4, CDKN1B, CDKN1C, CDKN2A, CEBPA, CHEK2, CTNNA1, DICER1, DIS3L2, EGFR, EPCAM, FH, FLCN, GATA2, GPC3, GREM1, HOXB13, HRAS, KIT, MAX, MEN1, MET, MITF, MLH1, MSH2, MSH3, MSH6, MUTYH, NBN, NF1, NF2, NTHL1, PALB2, PDGFRA, PHOX2B, PMS2, POLD1, POLE,  POT1, PRKAR1A, PTCH1, PTEN, RAD50, RAD51C, RAD51D, RB1, RECQL4, RET, RUNX1, SDHA, SDHAF2, SDHB, SDHC, SDHD, SMAD4, SMARCA4, SMARCB1, SMARCE1, STK11, SUFU, TERC, TERT, TMEM127, TP53, TSC1, TSC2, VHL, WRN, WT1).        REVIEW OF SYSTEMS:   Constitutional: Denies fevers, chills or abnormal weight loss Eyes: Denies blurriness of vision Ears, nose, mouth, throat, and face: Denies mucositis or sore throat Respiratory: Denies cough, dyspnea or wheezes Cardiovascular: Denies palpitation, chest discomfort or lower extremity swelling Gastrointestinal:  Denies nausea, heartburn or change in bowel habits Skin: Denies abnormal skin rashes Lymphatics: Denies new lymphadenopathy or easy bruising Neurological:Denies numbness, tingling or new weaknesses Behavioral/Psych: Mood is stable, no new changes  All other systems were reviewed with the patient and are negative.  I have reviewed the past medical history, past surgical history, social history and family history with the patient and they are unchanged from previous note.  ALLERGIES:  has No Known Allergies.  MEDICATIONS:  Current Outpatient Medications  Medication Sig Dispense Refill  . acetaminophen (TYLENOL) 325 MG tablet Take 650 mg by mouth.    Marland Kitchen aspirin EC 81 MG tablet Take 81 mg by mouth daily.    . fluticasone (FLONASE) 50 MCG/ACT nasal spray Place into the nose.    . lidocaine-prilocaine (EMLA) cream Apply to affected area once 30 g 3  . Misc. Devices (FOLDING [place]) MISC 1 Device by Does not apply route as needed (for use with ambulation as needed). Folding walker with seat and wide webbing for back support and safety for dx: M19.90 and Z91.81 (osteoarthritis and fall risk) 1 each 0  . Multiple Vitamins-Minerals (CENTRUM SILVER ADULT 50+ PO) Take 1 tablet by mouth daily.    . ondansetron (ZOFRAN) 8 MG tablet Take 1 tablet (8 mg total) by mouth 2 (two) times daily as needed for refractory nausea / vomiting. Start on day 3 after chemo.  (Patient not taking: Reported on  05/18/2017) 30 tablet 1  . PARoxetine (PAXIL) 20 MG tablet TAKE ONE TABLET BY MOUTH ONCE DAILY IN THE MORNING 90 tablet 3  . polyethylene glycol (MIRALAX / GLYCOLAX) packet Take 17 g by mouth daily.    . prochlorperazine (COMPAZINE) 10 MG tablet Take 1 tablet (10 mg total) by mouth every 6 (six) hours as needed (Nausea or vomiting). (Patient not taking: Reported on 05/18/2017) 30 tablet 1  . simvastatin (ZOCOR) 40 MG tablet TAKE ONE TABLET BY MOUTH EVERY DAY AT BEDTIME 90 tablet 3  . tolterodine (DETROL LA) 4 MG 24 hr capsule TAKE ONE CAPSULE BY MOUTH ONCE DAILY 90 capsule 3   No current facility-administered medications for this visit.    Facility-Administered Medications Ordered in Other Visits  Medication Dose Route Frequency Provider Last Rate Last Dose  . sodium chloride flush (NS) 0.9 % injection 10 mL  10 mL Intracatheter PRN Alvy Bimler, Josecarlos Harriott, MD   10 mL at 06/30/17 1704    PHYSICAL EXAMINATION: ECOG PERFORMANCE STATUS: 1 - Symptomatic but completely ambulatory  Vitals:   06/30/17 1357  BP: 136/63  Pulse: 86  Resp: 18  Temp: 98.6 F (37 C)  SpO2: 100%   Filed Weights   06/30/17 1357  Weight: 166 lb 3.2 oz (75.4 kg)    GENERAL:alert, no distress and comfortable SKIN: skin color, texture, turgor are normal, no rashes or significant lesions EYES: normal, Conjunctiva are pink and non-injected, sclera clear OROPHARYNX:no exudate, no erythema and lips, buccal mucosa, and tongue normal  NECK: supple, thyroid normal size, non-tender, without nodularity LYMPH:  no palpable lymphadenopathy in the cervical, axillary or inguinal LUNGS: clear to auscultation and percussion with normal breathing effort HEART: regular rate & rhythm and no murmurs and no lower extremity edema ABDOMEN:abdomen soft, non-tender and normal bowel sounds Musculoskeletal:no cyanosis of digits and no clubbing  NEURO: alert & oriented x 3 with fluent speech, no focal motor/sensory  deficits  LABORATORY DATA:  I have reviewed the data as listed    Component Value Date/Time   NA 139 06/30/2017 1305   NA 142 04/28/2017 1207   K 4.2 06/30/2017 1305   K 4.5 04/28/2017 1207   CL 105 06/30/2017 1305   CO2 25 06/30/2017 1305   CO2 26 04/28/2017 1207   GLUCOSE 136 06/30/2017 1305   GLUCOSE 90 04/28/2017 1207   BUN 14 06/30/2017 1305   BUN 13.1 04/28/2017 1207   CREATININE 0.95 06/30/2017 1305   CREATININE 0.9 04/28/2017 1207   CALCIUM 9.4 06/30/2017 1305   CALCIUM 9.2 04/28/2017 1207   PROT 7.0 06/30/2017 1305   PROT 6.8 04/28/2017 1207   ALBUMIN 3.9 06/30/2017 1305   ALBUMIN 3.9 04/28/2017 1207   AST 18 06/30/2017 1305   AST 18 04/28/2017 1207   ALT 8 06/30/2017 1305   ALT 11 04/28/2017 1207   ALKPHOS 42 06/30/2017 1305   ALKPHOS 42 04/28/2017 1207   BILITOT 0.3 06/30/2017 1305   BILITOT 0.36 04/28/2017 1207   GFRNONAA 54 (L) 06/30/2017 1305   GFRAA >60 06/30/2017 1305    No results found for: SPEP, UPEP  Lab Results  Component Value Date   WBC 4.4 06/30/2017   NEUTROABS 2.9 06/30/2017   HGB 10.3 (L) 06/30/2017   HCT 30.8 (L) 06/30/2017   MCV 99.1 06/30/2017   PLT 187 06/30/2017      Chemistry      Component Value Date/Time   NA 139 06/30/2017 1305   NA 142 04/28/2017 1207  K 4.2 06/30/2017 1305   K 4.5 04/28/2017 1207   CL 105 06/30/2017 1305   CO2 25 06/30/2017 1305   CO2 26 04/28/2017 1207   BUN 14 06/30/2017 1305   BUN 13.1 04/28/2017 1207   CREATININE 0.95 06/30/2017 1305   CREATININE 0.9 04/28/2017 1207      Component Value Date/Time   CALCIUM 9.4 06/30/2017 1305   CALCIUM 9.2 04/28/2017 1207   ALKPHOS 42 06/30/2017 1305   ALKPHOS 42 04/28/2017 1207   AST 18 06/30/2017 1305   AST 18 04/28/2017 1207   ALT 8 06/30/2017 1305   ALT 11 04/28/2017 1207   BILITOT 0.3 06/30/2017 1305   BILITOT 0.36 04/28/2017 1207     All questions were answered. The patient knows to call the clinic with any problems, questions or concerns. No  barriers to learning was detected.  I spent 15 minutes counseling the patient face to face. The total time spent in the appointment was 20 minutes and more than 50% was on counseling and review of test results  Heath Lark, MD 06/30/2017 5:08 PM

## 2017-06-30 NOTE — Assessment & Plan Note (Signed)
She tolerated single agent carboplatin well without side effects I will proceed with cycle 5 of chemotherapy. We would proceed with treatment without dose adjustment, for total of 6 cycles of treatment.  After that, we will get her port removed and have close follow-up with GYN oncologist.

## 2017-07-21 ENCOUNTER — Inpatient Hospital Stay: Payer: Medicare Other

## 2017-07-21 ENCOUNTER — Other Ambulatory Visit: Payer: Medicare Other

## 2017-07-21 ENCOUNTER — Ambulatory Visit: Payer: Medicare Other | Admitting: Hematology and Oncology

## 2017-07-21 ENCOUNTER — Encounter: Payer: Self-pay | Admitting: Hematology and Oncology

## 2017-07-21 ENCOUNTER — Ambulatory Visit: Payer: Medicare Other

## 2017-07-21 ENCOUNTER — Inpatient Hospital Stay (HOSPITAL_BASED_OUTPATIENT_CLINIC_OR_DEPARTMENT_OTHER): Payer: Medicare Other | Admitting: Hematology and Oncology

## 2017-07-21 DIAGNOSIS — R202 Paresthesia of skin: Secondary | ICD-10-CM

## 2017-07-21 DIAGNOSIS — C541 Malignant neoplasm of endometrium: Secondary | ICD-10-CM

## 2017-07-21 DIAGNOSIS — D6481 Anemia due to antineoplastic chemotherapy: Secondary | ICD-10-CM | POA: Diagnosis not present

## 2017-07-21 DIAGNOSIS — Z5111 Encounter for antineoplastic chemotherapy: Secondary | ICD-10-CM | POA: Diagnosis not present

## 2017-07-21 DIAGNOSIS — T451X5A Adverse effect of antineoplastic and immunosuppressive drugs, initial encounter: Secondary | ICD-10-CM

## 2017-07-21 LAB — CBC WITH DIFFERENTIAL/PLATELET
Basophils Absolute: 0 10*3/uL (ref 0.0–0.1)
Basophils Relative: 1 %
EOS ABS: 0.1 10*3/uL (ref 0.0–0.5)
Eosinophils Relative: 2 %
HEMATOCRIT: 28 % — AB (ref 34.8–46.6)
Hemoglobin: 9.5 g/dL — ABNORMAL LOW (ref 11.6–15.9)
LYMPHS PCT: 21 %
Lymphs Abs: 1 10*3/uL (ref 0.9–3.3)
MCH: 34.6 pg — AB (ref 25.1–34.0)
MCHC: 33.9 g/dL (ref 31.5–36.0)
MCV: 102.1 fL — AB (ref 79.5–101.0)
MONOS PCT: 8 %
Monocytes Absolute: 0.4 10*3/uL (ref 0.1–0.9)
NEUTROS ABS: 3.3 10*3/uL (ref 1.5–6.5)
NEUTROS PCT: 68 %
Platelets: 222 10*3/uL (ref 145–400)
RBC: 2.75 MIL/uL — AB (ref 3.70–5.45)
RDW: 18.1 % — ABNORMAL HIGH (ref 11.2–14.5)
WBC: 4.8 10*3/uL (ref 3.9–10.3)

## 2017-07-21 LAB — COMPREHENSIVE METABOLIC PANEL
ALT: 12 U/L (ref 0–55)
AST: 18 U/L (ref 5–34)
Albumin: 3.5 g/dL (ref 3.5–5.0)
Alkaline Phosphatase: 63 U/L (ref 40–150)
Anion gap: 9 (ref 3–11)
BUN: 15 mg/dL (ref 7–26)
CHLORIDE: 107 mmol/L (ref 98–109)
CO2: 25 mmol/L (ref 22–29)
CREATININE: 0.87 mg/dL (ref 0.60–1.10)
Calcium: 9.3 mg/dL (ref 8.4–10.4)
Glucose, Bld: 126 mg/dL (ref 70–140)
POTASSIUM: 4.1 mmol/L (ref 3.5–5.1)
SODIUM: 141 mmol/L (ref 136–145)
Total Bilirubin: 0.3 mg/dL (ref 0.2–1.2)
Total Protein: 7 g/dL (ref 6.4–8.3)

## 2017-07-21 MED ORDER — SODIUM CHLORIDE 0.9 % IV SOLN
Freq: Once | INTRAVENOUS | Status: AC
  Administered 2017-07-21: 14:00:00 via INTRAVENOUS

## 2017-07-21 MED ORDER — SODIUM CHLORIDE 0.9 % IV SOLN
Freq: Once | INTRAVENOUS | Status: AC
  Administered 2017-07-21: 14:00:00 via INTRAVENOUS
  Filled 2017-07-21: qty 5

## 2017-07-21 MED ORDER — PALONOSETRON HCL INJECTION 0.25 MG/5ML
INTRAVENOUS | Status: AC
  Filled 2017-07-21: qty 5

## 2017-07-21 MED ORDER — PALONOSETRON HCL INJECTION 0.25 MG/5ML
0.2500 mg | Freq: Once | INTRAVENOUS | Status: AC
  Administered 2017-07-21: 0.25 mg via INTRAVENOUS

## 2017-07-21 MED ORDER — HEPARIN SOD (PORK) LOCK FLUSH 100 UNIT/ML IV SOLN
500.0000 [IU] | Freq: Once | INTRAVENOUS | Status: AC | PRN
  Administered 2017-07-21: 500 [IU]
  Filled 2017-07-21: qty 5

## 2017-07-21 MED ORDER — SODIUM CHLORIDE 0.9 % IV SOLN
400.0000 mg | Freq: Once | INTRAVENOUS | Status: AC
  Administered 2017-07-21: 400 mg via INTRAVENOUS
  Filled 2017-07-21: qty 40

## 2017-07-21 MED ORDER — SODIUM CHLORIDE 0.9% FLUSH
10.0000 mL | Freq: Once | INTRAVENOUS | Status: AC
  Administered 2017-07-21: 10 mL
  Filled 2017-07-21: qty 10

## 2017-07-21 MED ORDER — SODIUM CHLORIDE 0.9% FLUSH
10.0000 mL | INTRAVENOUS | Status: DC | PRN
  Administered 2017-07-21: 10 mL
  Filled 2017-07-21: qty 10

## 2017-07-21 NOTE — Progress Notes (Signed)
[facility] OFFICE PROGRESS NOTE  Patient Care Team: Tower, [name], MD as PCP - General Rutherford Guys, MD as Consulting Physician (Ophthalmology) Sydnee Levans, MD as Consulting Physician (Dermatology) Jerrell Belfast, MD as Consulting Physician (Otolaryngology) Barrie Dunker Nathaneil Canary, DMD as Consulting Physician (Dentistry) Druscilla Brownie, MD as Consulting Physician (Dermatology) Heloise Purpura, DDS as Consulting Physician (Dentistry)  ASSESSMENT & PLAN:  Endometrial ca [facility]) She has mild anemia but otherwise tolerated treatment well We will proceed with final treatment today We discussed future follow-up I will refer her to see 1 of the GYN oncologist for follow-up in about 3 months I would get her port removed in 1 month  Anemia due to antineoplastic chemotherapy This is likely due to recent treatment. The patient denies recent history of bleeding such as epistaxis, hematuria or hematochezia. She is asymptomatic from the anemia. I will observe for now.    Paresthesia of bilateral legs The patient had a spinal cord injury many years ago She has baseline paresthesia affecting her lower extremities So far, chemotherapy has not affected her neuropathy and weakness   No orders of the defined types were placed in this encounter.   INTERVAL HISTORY: Please see below for problem oriented charting. She returns for further follow-up She denies new symptoms since her last dose of chemo No recent nausea, vomiting or abnormal bloating or vaginal bleeding Denies worsening weakness or neuropathy  SUMMARY OF ONCOLOGIC HISTORY: Oncology History   Serous, strong ER and PR, MSI high, decreased staining for hMLH1/hPMS2 Hypermethylation positive at [facility] (rules out Lynch syndrome)     Endometrial ca ([place])   02/02/2017 Initial Diagnosis    High grade Endometrial ca ([place])      02/21/2017 Imaging    1. No evidence of metastatic endometrial carcinoma in pelvis.  Carcinoma appears confined to the uterus. 2. Ovaries are small and difficult to define. 3. No evidence of metastatic disease in the abdomen. 4. Single small LEFT lower lobe pulmonary nodule is nonspecific. Recommend attention on follow-up      03/04/2017 Surgery    Robotic hysterectomy, BSO and SLN. Stage as IB UPSC. 92% myometrial invasive, no LVSI, negative nodes. Pre-op CT negative.      03/04/2017 Pathology Results    ENDOMETRIUM(Endometrium - All Specimens)   SPECIMEN  Procedure:Simple hysterectomy   Procedure:Bilateral salpingo-oophorectomy   Hysterectomy Type:Laparoscopic, robotic-assisted   Specimen Integrity:Intact   TUMOR  Tumor Site:Endometrium   Histologic Type:Serous carcinoma   Tumor Size:Greatest dimension in Centimeters (cm): 2 Centimeters (cm)  Tumor Extent:  Myometrial Invasion:Present   Depth of Invasion in Millimeters (mm):12 Millimeters (mm)  Myometrial Thickness in Millimeters (mm):13 Millimeters (mm)  Percentage of Myometrial Invasion:92 %  Adenomyosis:Present, uninvolved by carcinoma   Uterine Serosa Involvement:Not identified   Lower Uterine Segment Involvement:Not identified   Cervical Stromal Involvement:Not identified   Other Tissue / Organ Involvement:Not identified   Peritoneal Ascitic Fluid:Not submitted / unknown   Accessory Findings:  Lymphovascular Invasion:Not identified   MARGINS  LYMPH NODES  Number of Pelvic Nodes with Macrometastasis:0   Number of Pelvic Nodes with Micrometastasis:0   Number of Pelvic Nodes with Isolated Tumor Cells:0   Total Number of Pelvic Node(s) Examined (sentinel and nonsentinel):2   Number of Pelvic Sentinel Nodes Examined:2   Laterality of Pelvic Node(s) Examined:Right   Laterality of Pelvic Node(s) Examined:Left    Number of Para-aortic Nodes with Macrometastasis:0   Number of Para-aortic Nodes with Micrometastasis:0   Number of Para-aortic Nodes with Isolated Tumor  Cells:0   Total Number of Para-aortic Node(s) Examined (sentinel and nonsentinel):1   Number of Para-aortic Sentinel Nodes Examined:1   Laterality of Para-aortic Node(s) Examined:Right   PATHOLOGIC STAGE CLASSIFICATION (pTNM, AJCC 8th Edition)  Primary Tumor (pT):pT1b   Regional Lymph Nodes (pN):  Modifier:(sn)   Category (pN):pN0   FIGO STAGE  FIGO Stage:IB  Much of the tumor shows extensive tubule formation, but with high grade nuclei. The tubule forming areas merge with poorly differentiated solid tumor with similar high grade nuclei. Immunohistochemical stains were performed with the following results:   Immunostain   Tubule forming areas  Solid areas  Cytokeratin AE1/AE3  Diffusely positive  Diffusely positive  Vimentin   Focal reactivity   Few cells positive  PAX-8    Diffusely positive  Patchy positivity  p16    Patchy positivity   Diffusely positive  p53    Negative (null)   Negative (null)  Estrogen receptor  95%, strong   80%, moderate  Progesterone receptor  90%, strong   40%, weak to moderate  Although there are differences in the immunostaining pattern between the tubule forming areas and the solid areas, the high grade nuclear features are similar and the p53 is completely negative (abnormal) in both areas. Based on these findings, the tumor is best classified as serous carcinoma.  Immunohistochemical stains for mismatch repair proteins performed on a representative block of tumor: Immunostains for mismatch repair proteins were performed on block D10 and show intact expression of MSH2 and MSH6 within the tumor. MLH1 and PMS2 are markedly decreased, but a few tumor cells are positive. This pattern does NOT support a diagnosis of microsatellite  instability/hereditary non-polyposis colorectal cancer (HNPCC) associated carcinoma. Molecular testing for additional microsatellite instability markers will be performed by the [employer] [phone]) and reported separately.  SPECIMEN: Endometrium: Unstained slides/blocks received from [facility] with an original collection date of 03/04/2017 and a reported diagnosis of endometrial adenocarcinoma.    Tumor: [id number] D10 - Areas selected for macrodissection contain approximately 80% tumor nuclei. Normal: [id number] D1  RESULT:  This tumor demonstrates a Microsatellite Instability - High (MSI-H) phenotype.  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 Patients with MSI-H tumors are at elevated risk of having Lynch syndrome and carrying a heritable mutation in a mismatch-repair gene; however, the majority of these patients have a sporadic rather than inherited form of cancer (see comment).   This result is consistent with the reported decreased staining in mismatch repair proteins MLH1 and PMS2 by immunohistochemistry ([id number]). MLH1 promoter methylation analysis is pending to determine the likelihood of Lynch syndrome in this patient and will be reported separately.  This result has implications for therapy selection, as MSI-H tumors are more likely to respond to immune modulating agents such as pembrolizumab Marin Comment DT, et al.  Alison Stalling J Med;372:2509-20, 2015).  COMMENT: The MSI-H phenotype is detected in approximately 90% of colorectal and endometrial carcinoma arising in patients with Lynch syndrome/ Hereditary Non-Polyposis Colorectal Cancer (HNPCC) and in 10-15% of colorectal carcinoma and 20-30% of endometrial cancer arising sporadically. This phenotype is associated with germline or somatic inactivation of DNA mismatch  repair genes (MLH1, MSH2, MSH6, PMS2) which results in an inability to correct mismatches generated in microsatellite sequences during DNA replication (Peltomaki P., Tora Perches 22:2979-8921, 2003; Palomaki, GE, et al, Genetics in Medicine 11:42, 2009).     METHOD:  DNA isolated from macrodissected paraffin-embedded slides of normal tissue and tumor tissue was PCR amplified across five mononucleotide microsatellite markers (BAT-25, BAT-26, NR-21, NR-24, and MONO-27) and analyzed by fluorescent capillary electrophoresis (Promega).  Allelic profiles from the normal and tumor tissue were compared to determine MSI status at each of the five markers. MSI-H is designated when a detectable size difference exists in two or more of the five microsatellites tested.        04/01/2017 Procedure    Placement of a subcutaneous port device.      04/07/2017 -  Chemotherapy    The patient had single agent carboplatin      05/18/2017 - 06/02/2017 Radiation Therapy    The patient had radiation therapy Radiation treatment dates:   05/18/2017, 05/23/2017, 05/25/2017, 05/31/2017, 06/02/2017  Site/dose:  Proximal vagina/ 30 Gy in 5 treatments  Beams/energy:  Iridium 192 high-dose-rate, treatment length was 4 cm, 3.0 cm diameter segmented cylinder      05/23/2017 Genetic Testing    Multi-Cancer panel (83 genes) @ [facility] - No pathogenic mutations detected Variant of Uncertain Significance in MSH6  Genes Analyzed: 83 genes on [facility]'s Multi-Cancer panel (ALK, APC, ATM, AXIN2, BAP1, BARD1, BLM, BMPR1A, BRCA1, BRCA2, BRIP1, CASR, CDC73, CDH1, CDK4, CDKN1B, CDKN1C, CDKN2A, CEBPA, CHEK2, CTNNA1, DICER1, DIS3L2, EGFR, EPCAM, FH, FLCN, GATA2, GPC3, GREM1, HOXB13, HRAS, KIT, MAX, MEN1, MET, MITF, MLH1, MSH2, MSH3, MSH6, MUTYH, NBN, NF1, NF2, NTHL1, PALB2, PDGFRA, PHOX2B, PMS2, POLD1, POLE, POT1, PRKAR1A, PTCH1, PTEN, RAD50, RAD51C, RAD51D, RB1, RECQL4, RET, RUNX1, SDHA, SDHAF2, SDHB, SDHC, SDHD, SMAD4, SMARCA4, SMARCB1,  SMARCE1, STK11, SUFU, TERC, TERT, TMEM127, TP53, TSC1, TSC2, VHL, WRN, WT1).        REVIEW OF SYSTEMS:   Constitutional: Denies fevers, chills or abnormal weight loss Eyes: Denies blurriness of vision Ears, nose, mouth, throat, and face: Denies mucositis or sore throat Respiratory: Denies cough, dyspnea or wheezes Cardiovascular: Denies palpitation, chest discomfort or lower extremity swelling Gastrointestinal:  Denies nausea, heartburn or change in bowel habits Skin: Denies abnormal skin rashes Lymphatics: Denies new lymphadenopathy or easy bruising Neurological:Denies numbness, tingling or new weaknesses Behavioral/Psych: Mood is stable, no new changes  All other systems were reviewed with the patient and are negative.  I have reviewed the past medical history, past surgical history, social history and family history with the patient and they are unchanged from previous note.  ALLERGIES:  has No Known Allergies.  MEDICATIONS:  Current Outpatient Medications  Medication Sig Dispense Refill  . acetaminophen (TYLENOL) 325 MG tablet Take 650 mg by mouth.    Marland Kitchen aspirin EC 81 MG tablet Take 81 mg by mouth daily.    . fluticasone (FLONASE) 50 MCG/ACT nasal spray Place into the nose.    . lidocaine-prilocaine (EMLA) cream Apply to affected area once 30 g 3  . Misc. Devices (FOLDING [place]) MISC 1 Device by Does not apply route as needed (for use with ambulation as needed). Folding walker with seat and wide webbing for back support and safety for dx: M19.90 and Z91.81 (osteoarthritis and fall risk) 1 each 0  . Multiple Vitamins-Minerals (CENTRUM SILVER ADULT 50+ PO) Take 1 tablet by mouth daily.    . ondansetron (ZOFRAN) 8 MG tablet Take 1 tablet (8 mg total) by mouth 2 (two) times daily as needed for refractory nausea / vomiting. Start on day 3 after chemo. (Patient not taking: Reported on 05/18/2017) 30 tablet 1  . PARoxetine (PAXIL) 20 MG tablet TAKE ONE TABLET BY MOUTH ONCE DAILY IN  THE  MORNING 90 tablet 3  . polyethylene glycol (MIRALAX / GLYCOLAX) packet Take 17 g by mouth daily.    . prochlorperazine (COMPAZINE) 10 MG tablet Take 1 tablet (10 mg total) by mouth every 6 (six) hours as needed (Nausea or vomiting). (Patient not taking: Reported on 05/18/2017) 30 tablet 1  . simvastatin (ZOCOR) 40 MG tablet TAKE ONE TABLET BY MOUTH EVERY DAY AT BEDTIME 90 tablet 3  . tolterodine (DETROL LA) 4 MG 24 hr capsule TAKE ONE CAPSULE BY MOUTH ONCE DAILY 90 capsule 3   No current facility-administered medications for this visit.     PHYSICAL EXAMINATION: ECOG PERFORMANCE STATUS: 1 - Symptomatic but completely ambulatory  Vitals:   07/21/17 1208  BP: 135/72  Pulse: 89  Resp: 18  Temp: 98.3 F (36.8 C)  SpO2: 100%   Filed Weights   07/21/17 1208  Weight: 164 lb 9.6 oz (74.7 kg)    GENERAL:alert, no distress and comfortable SKIN: skin color, texture, turgor are normal, no rashes or significant lesions EYES: normal, Conjunctiva are pink and non-injected, sclera clear OROPHARYNX:no exudate, no erythema and lips, buccal mucosa, and tongue normal  NECK: supple, thyroid normal size, non-tender, without nodularity LYMPH:  no palpable lymphadenopathy in the cervical, axillary or inguinal LUNGS: clear to auscultation and percussion with normal breathing effort HEART: regular rate & rhythm and no murmurs and no lower extremity edema ABDOMEN:abdomen soft, non-tender and normal bowel sounds Musculoskeletal:no cyanosis of digits and no clubbing  NEURO: alert & oriented x 3 with fluent speech, with chronic weakness, stable LABORATORY DATA:  I have reviewed the data as listed    Component Value Date/Time   NA 139 06/30/2017 1305   NA 142 04/28/2017 1207   K 4.2 06/30/2017 1305   K 4.5 04/28/2017 1207   CL 105 06/30/2017 1305   CO2 25 06/30/2017 1305   CO2 26 04/28/2017 1207   GLUCOSE 136 06/30/2017 1305   GLUCOSE 90 04/28/2017 1207   BUN 14 06/30/2017 1305   BUN 13.1  04/28/2017 1207   CREATININE 0.95 06/30/2017 1305   CREATININE 0.9 04/28/2017 1207   CALCIUM 9.4 06/30/2017 1305   CALCIUM 9.2 04/28/2017 1207   PROT 7.0 06/30/2017 1305   PROT 6.8 04/28/2017 1207   ALBUMIN 3.9 06/30/2017 1305   ALBUMIN 3.9 04/28/2017 1207   AST 18 06/30/2017 1305   AST 18 04/28/2017 1207   ALT 8 06/30/2017 1305   ALT 11 04/28/2017 1207   ALKPHOS 42 06/30/2017 1305   ALKPHOS 42 04/28/2017 1207   BILITOT 0.3 06/30/2017 1305   BILITOT 0.36 04/28/2017 1207   GFRNONAA 54 (L) 06/30/2017 1305   GFRAA >60 06/30/2017 1305    No results found for: SPEP, UPEP  Lab Results  Component Value Date   WBC 4.8 07/21/2017   NEUTROABS 3.3 07/21/2017   HGB 9.5 (L) 07/21/2017   HCT 28.0 (L) 07/21/2017   MCV 102.1 (H) 07/21/2017   PLT 222 07/21/2017      Chemistry      Component Value Date/Time   NA 139 06/30/2017 1305   NA 142 04/28/2017 1207   K 4.2 06/30/2017 1305   K 4.5 04/28/2017 1207   CL 105 06/30/2017 1305   CO2 25 06/30/2017 1305   CO2 26 04/28/2017 1207   BUN 14 06/30/2017 1305   BUN 13.1 04/28/2017 1207   CREATININE 0.95 06/30/2017 1305   CREATININE 0.9 04/28/2017 1207      Component Value Date/Time  CALCIUM 9.4 06/30/2017 1305   CALCIUM 9.2 04/28/2017 1207   ALKPHOS 42 06/30/2017 1305   ALKPHOS 42 04/28/2017 1207   AST 18 06/30/2017 1305   AST 18 04/28/2017 1207   ALT 8 06/30/2017 1305   ALT 11 04/28/2017 1207   BILITOT 0.3 06/30/2017 1305   BILITOT 0.36 04/28/2017 1207       All questions were answered. The patient knows to call the clinic with any problems, questions or concerns. No barriers to learning was detected.  I spent 15 minutes counseling the patient face to face. The total time spent in the appointment was 20 minutes and more than 50% was on counseling and review of test results  Heath Lark, MD 07/21/2017 12:26 PM

## 2017-07-21 NOTE — Patient Instructions (Signed)
[facility] Discharge Instructions for Patients Receiving Chemotherapy  Today you received the following chemotherapy agents Carboplatin  To help prevent nausea and vomiting after your treatment, we encourage you to take your nausea medication as directed   If you develop nausea and vomiting that is not controlled by your nausea medication, call the clinic.   BELOW ARE SYMPTOMS THAT SHOULD BE REPORTED IMMEDIATELY:  *FEVER GREATER THAN 100.5 F  *CHILLS WITH OR WITHOUT FEVER  NAUSEA AND VOMITING THAT IS NOT CONTROLLED WITH YOUR NAUSEA MEDICATION  *UNUSUAL SHORTNESS OF BREATH  *UNUSUAL BRUISING OR BLEEDING  TENDERNESS IN MOUTH AND THROAT WITH OR WITHOUT PRESENCE OF ULCERS  *URINARY PROBLEMS  *BOWEL PROBLEMS  UNUSUAL RASH Items with * indicate a potential emergency and should be followed up as soon as possible.  Feel free to call the clinic should you have any questions or concerns. The clinic phone number is [phone].  Please show the CHEMO ALERT CARD at check-in to the Emergency Department and triage nurse.   

## 2017-07-21 NOTE — Assessment & Plan Note (Signed)
The patient had a spinal cord injury many years ago She has baseline paresthesia affecting her lower extremities So far, chemotherapy has not affected her neuropathy and weakness

## 2017-07-21 NOTE — Assessment & Plan Note (Signed)
She has mild anemia but otherwise tolerated treatment well We will proceed with final treatment today We discussed future follow-up I will refer her to see 1 of the GYN oncologist for follow-up in about 3 months I would get her port removed in 1 month

## 2017-07-21 NOTE — Assessment & Plan Note (Addendum)
This is likely due to recent treatment. The patient denies recent history of bleeding such as epistaxis, hematuria or hematochezia. She is asymptomatic from the anemia. I will observe for now.   

## 2017-07-25 ENCOUNTER — Telehealth: Payer: Self-pay

## 2017-07-25 NOTE — Telephone Encounter (Signed)
Called and left message per Dr. Alvy Bimler. You will keep the port for one month after your last treatment and then Dr. Alvy Bimler will place a order for port removal. Instructed to call for questions.

## 2017-07-25 NOTE — Telephone Encounter (Signed)
-----   Message from Rennis Harding, RN sent at 07/22/2017  8:38 AM EDT ----- Regarding: Dr. Alvy Bimler - Possible port removal  Patient is interested in possible port removal.  Last treatment 3/28/ 2019.

## 2017-07-26 ENCOUNTER — Telehealth: Payer: Self-pay | Admitting: *Deleted

## 2017-07-26 NOTE — Telephone Encounter (Signed)
Called and left the patient a message to call office tomorrow morning

## 2017-07-27 ENCOUNTER — Telehealth: Payer: Self-pay | Admitting: *Deleted

## 2017-07-27 NOTE — Telephone Encounter (Signed)
Called and scheduled the patient to see Dr. Gerarda Fraction for a follow up appt

## 2017-08-10 ENCOUNTER — Other Ambulatory Visit: Payer: Self-pay | Admitting: Hematology and Oncology

## 2017-08-10 DIAGNOSIS — C541 Malignant neoplasm of endometrium: Secondary | ICD-10-CM

## 2017-08-17 ENCOUNTER — Other Ambulatory Visit: Payer: Self-pay | Admitting: Physician Assistant

## 2017-08-18 ENCOUNTER — Ambulatory Visit (HOSPITAL_COMMUNITY)
Admission: RE | Admit: 2017-08-18 | Discharge: 2017-08-18 | Disposition: A | Discharge status: Home / Self Care | Payer: Medicare Other | Source: Ambulatory Visit | Attending: Hematology and Oncology | Admitting: Hematology and Oncology

## 2017-08-18 ENCOUNTER — Encounter (HOSPITAL_COMMUNITY): Payer: Self-pay

## 2017-08-18 DIAGNOSIS — Z809 Family history of malignant neoplasm, unspecified: Secondary | ICD-10-CM | POA: Insufficient documentation

## 2017-08-18 DIAGNOSIS — Z9889 Other specified postprocedural states: Secondary | ICD-10-CM | POA: Diagnosis not present

## 2017-08-18 DIAGNOSIS — F329 Major depressive disorder, single episode, unspecified: Secondary | ICD-10-CM | POA: Diagnosis not present

## 2017-08-18 DIAGNOSIS — Z452 Encounter for adjustment and management of vascular access device: Secondary | ICD-10-CM | POA: Insufficient documentation

## 2017-08-18 DIAGNOSIS — M1990 Unspecified osteoarthritis, unspecified site: Secondary | ICD-10-CM | POA: Insufficient documentation

## 2017-08-18 DIAGNOSIS — Z7982 Long term (current) use of aspirin: Secondary | ICD-10-CM | POA: Diagnosis not present

## 2017-08-18 DIAGNOSIS — Z981 Arthrodesis status: Secondary | ICD-10-CM | POA: Insufficient documentation

## 2017-08-18 DIAGNOSIS — C541 Malignant neoplasm of endometrium: Secondary | ICD-10-CM

## 2017-08-18 DIAGNOSIS — Z8542 Personal history of malignant neoplasm of other parts of uterus: Secondary | ICD-10-CM | POA: Diagnosis not present

## 2017-08-18 DIAGNOSIS — Z90710 Acquired absence of both cervix and uterus: Secondary | ICD-10-CM | POA: Diagnosis not present

## 2017-08-18 DIAGNOSIS — Z79899 Other long term (current) drug therapy: Secondary | ICD-10-CM | POA: Insufficient documentation

## 2017-08-18 HISTORY — PX: IR REMOVAL TUN ACCESS W/ PORT W/O FL MOD SED: IMG2290

## 2017-08-18 LAB — CBC
HCT: 33.1 % — ABNORMAL LOW (ref 36.0–46.0)
Hemoglobin: 11.1 g/dL — ABNORMAL LOW (ref 12.0–15.0)
MCH: 35.4 pg — AB (ref 26.0–34.0)
MCHC: 33.5 g/dL (ref 30.0–36.0)
MCV: 105.4 fL — ABNORMAL HIGH (ref 78.0–100.0)
PLATELETS: 276 10*3/uL (ref 150–400)
RBC: 3.14 MIL/uL — ABNORMAL LOW (ref 3.87–5.11)
RDW: 14.8 % (ref 11.5–15.5)
WBC: 5.8 10*3/uL (ref 4.0–10.5)

## 2017-08-18 LAB — PROTIME-INR
INR: 0.92
PROTHROMBIN TIME: 12.3 s (ref 11.4–15.2)

## 2017-08-18 LAB — APTT: aPTT: 34 seconds (ref 24–36)

## 2017-08-18 MED ORDER — SODIUM CHLORIDE 0.9 % IV SOLN
INTRAVENOUS | Status: DC
  Administered 2017-08-18: 10:00:00 via INTRAVENOUS

## 2017-08-18 MED ORDER — CEFAZOLIN SODIUM-DEXTROSE 2-4 GM/100ML-% IV SOLN
2.0000 g | Freq: Once | INTRAVENOUS | Status: AC
  Administered 2017-08-18: 2 g via INTRAVENOUS

## 2017-08-18 MED ORDER — MIDAZOLAM HCL 2 MG/2ML IJ SOLN
INTRAMUSCULAR | Status: AC
  Filled 2017-08-18: qty 4

## 2017-08-18 MED ORDER — CEFAZOLIN SODIUM-DEXTROSE 2-4 GM/100ML-% IV SOLN
INTRAVENOUS | Status: AC
  Filled 2017-08-18: qty 100

## 2017-08-18 MED ORDER — MIDAZOLAM HCL 2 MG/2ML IJ SOLN
INTRAMUSCULAR | Status: AC | PRN
  Administered 2017-08-18: 0.5 mg via INTRAVENOUS
  Administered 2017-08-18: 1 mg via INTRAVENOUS

## 2017-08-18 MED ORDER — FENTANYL CITRATE (PF) 100 MCG/2ML IJ SOLN
INTRAMUSCULAR | Status: AC
  Filled 2017-08-18: qty 4

## 2017-08-18 MED ORDER — LIDOCAINE-EPINEPHRINE (PF) 2 %-1:200000 IJ SOLN
INTRAMUSCULAR | Status: AC
  Filled 2017-08-18: qty 20

## 2017-08-18 MED ORDER — FENTANYL CITRATE (PF) 100 MCG/2ML IJ SOLN
INTRAMUSCULAR | Status: AC | PRN
  Administered 2017-08-18: 50 ug via INTRAVENOUS

## 2017-08-18 MED ORDER — LIDOCAINE-EPINEPHRINE (PF) 2 %-1:200000 IJ SOLN
INTRAMUSCULAR | Status: AC | PRN
  Administered 2017-08-18: 5 mL
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
## 2017-08-18 NOTE — Discharge Instructions (Signed)
Implanted Port Removal, Care After °Refer to this sheet in the next few weeks. These instructions provide you with information about caring for yourself after your procedure. Your health care provider may also give you more specific instructions. Your treatment has been planned according to current medical practices, but problems sometimes occur. Call your health care provider if you have any problems or questions after your procedure. °What can I expect after the procedure? °After the procedure, it is common to have: °· Soreness or pain near your incision. °· Some swelling or bruising near your incision. ° °Follow these instructions at home: °Medicines °· Take over-the-counter and prescription medicines only as told by your health care provider. °· If you were prescribed an antibiotic medicine, take it as told by your health care provider. Do not stop taking the antibiotic even if you start to feel better. °Bathing °· Do not take baths, swim, or use a hot tub until your health care provider approves. Ask your health care provider if you can take showers. You may only be allowed to take sponge baths for bathing. °Incision care °· Follow instructions from your health care provider about how to take care of your incision. Make sure you: °? Wash your hands with soap and water before you change your bandage (dressing). If soap and water are not available, use hand sanitizer. °? Change your dressing as told by your health care provider. °? Keep your dressing dry. °? Leave stitches (sutures), skin glue, or adhesive strips in place. These skin closures may need to stay in place for 2 weeks or longer. If adhesive strip edges start to loosen and curl up, you may trim the loose edges. Do not remove adhesive strips completely unless your health care provider tells you to do that. °· Check your incision area every day for signs of infection. Check for: °? More redness, swelling, or pain. °? More fluid or  blood. °? Warmth. °? Pus or a bad smell. °Driving °· If you received a sedative, do not drive for 24 hours after the procedure. °· If you did not receive a sedative, ask your health care provider when it is safe to drive. °Activity °· Return to your normal activities as told by your health care provider. Ask your health care provider what activities are safe for you. °· Until your health care provider says it is safe: °? Do not lift anything that is heavier than 10 lb (4.5 kg). °? Do not do activities that involve lifting your arms over your head. °General instructions °· Do not use any tobacco products, such as cigarettes, chewing tobacco, and e-cigarettes. Tobacco can delay healing. If you need help quitting, ask your health care provider. °· Keep all follow-up visits as told by your health care provider. This is important. °Contact a health care provider if: °· You have more redness, swelling, or pain around your incision. °· You have more fluid or blood coming from your incision. °· Your incision feels warm to the touch. °· You have pus or a bad smell coming from your incision. °· You have a fever. °· You have pain that is not relieved by your pain medicine. °Get help right away if: °· You have chest pain. °· You have difficulty breathing. °This information is not intended to replace advice given to you by your health care provider. Make sure you discuss any questions you have with your health care provider. °Document Released: 03/24/2015 Document Revised: 09/18/2015 Document Reviewed: 01/15/2015 °[employer] Interactive Patient   Education © 2018 [employer] °Moderate Conscious Sedation, Adult, Care After °These instructions provide you with information about caring for yourself after your procedure. Your health care provider may also give you more specific instructions. Your treatment has been planned according to current medical practices, but problems sometimes occur. Call your health care provider if you have  any problems or questions after your procedure. °What can I expect after the procedure? °After your procedure, it is common: °· To feel sleepy for several hours. °· To feel clumsy and have poor balance for several hours. °· To have poor judgment for several hours. °· To vomit if you eat too soon. ° °Follow these instructions at home: °For at least 24 hours after the procedure: ° °· Do not: °? Participate in activities where you could fall or become injured. °? Drive. °? Use heavy machinery. °? Drink alcohol. °? Take sleeping pills or medicines that cause drowsiness. °? Make important decisions or sign legal documents. °? Take care of children on your own. °· Rest. °Eating and drinking °· Follow the diet recommended by your health care provider. °· If you vomit: °? Drink water, juice, or soup when you can drink without vomiting. °? Make sure you have little or no nausea before eating solid foods. °General instructions °· Have a responsible adult stay with you until you are awake and alert. °· Take over-the-counter and prescription medicines only as told by your health care provider. °· If you smoke, do not smoke without supervision. °· Keep all follow-up visits as told by your health care provider. This is important. °Contact a health care provider if: °· You keep feeling nauseous or you keep vomiting. °· You feel light-headed. °· You develop a rash. °· You have a fever. °Get help right away if: °· You have trouble breathing. °This information is not intended to replace advice given to you by your health care provider. Make sure you discuss any questions you have with your health care provider. °Document Released: 01/31/2013 Document Revised: 09/15/2015 Document Reviewed: 08/02/2015 °[employer] Interactive Patient Education © 2018 [employer] ° °

## 2017-08-18 NOTE — Procedures (Signed)
Interventional Radiology Procedure Note  Procedure: Right chest portacatheter removal.  Complications: None  Estimated Blood Loss: None  Recommendations: - DC home  Signed,  Criselda Peaches, MD

## 2017-09-29 ENCOUNTER — Ambulatory Visit: Payer: Medicare Other | Admitting: Gynecologic Oncology
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
## 2017-10-07 ENCOUNTER — Encounter: Payer: Self-pay | Admitting: Obstetrics

## 2017-10-07 ENCOUNTER — Inpatient Hospital Stay: Payer: Medicare Other | Attending: Genetic Counselor | Admitting: Obstetrics

## 2017-10-07 ENCOUNTER — Inpatient Hospital Stay: Payer: Medicare Other

## 2017-10-07 DIAGNOSIS — Z9221 Personal history of antineoplastic chemotherapy: Secondary | ICD-10-CM | POA: Insufficient documentation

## 2017-10-07 DIAGNOSIS — C541 Malignant neoplasm of endometrium: Secondary | ICD-10-CM | POA: Diagnosis present

## 2017-10-07 DIAGNOSIS — Z923 Personal history of irradiation: Secondary | ICD-10-CM | POA: Insufficient documentation

## 2017-10-07 NOTE — Patient Instructions (Signed)
1. Return in 3 months for a pelvic and bloodwork 2. CTscan about 1 week prior to your next visit.

## 2017-10-08 LAB — CA 125: Cancer Antigen (CA) 125: 13.2 U/mL (ref 0.0–38.1)

## 2017-10-10 ENCOUNTER — Telehealth: Payer: Self-pay

## 2017-10-10 NOTE — Telephone Encounter (Signed)
Outgoing call to patient per Joylene John NP regarding results of CA 125 "stable and WNL" - pt voiced understanding. No needs per pt at this time.

## 2017-12-19 ENCOUNTER — Telehealth: Payer: Self-pay | Admitting: Family Medicine

## 2017-12-19 DIAGNOSIS — Z0000 Encounter for general adult medical examination without abnormal findings: Secondary | ICD-10-CM

## 2017-12-19 DIAGNOSIS — D6481 Anemia due to antineoplastic chemotherapy: Secondary | ICD-10-CM

## 2017-12-19 DIAGNOSIS — E782 Mixed hyperlipidemia: Secondary | ICD-10-CM

## 2017-12-19 DIAGNOSIS — T451X5A Adverse effect of antineoplastic and immunosuppressive drugs, initial encounter: Secondary | ICD-10-CM

## 2017-12-19 NOTE — Telephone Encounter (Signed)
-----   Message from Eustace Pen, LPN sent at 3/79/5583 12:11 PM EDT ----- Regarding: Labs 8/27 Lab orders needed. Thank you.  Insurance:  [facility] Medicare

## 2017-12-20 ENCOUNTER — Ambulatory Visit (INDEPENDENT_AMBULATORY_CARE_PROVIDER_SITE_OTHER): Payer: Medicare Other

## 2017-12-20 VITALS — BP 116/62 | HR 57 | Temp 98.6°F | Ht 65.5 in | Wt 156.5 lb

## 2017-12-20 DIAGNOSIS — E782 Mixed hyperlipidemia: Secondary | ICD-10-CM

## 2017-12-20 DIAGNOSIS — Z0000 Encounter for general adult medical examination without abnormal findings: Secondary | ICD-10-CM

## 2017-12-20 DIAGNOSIS — D6481 Anemia due to antineoplastic chemotherapy: Secondary | ICD-10-CM | POA: Diagnosis not present

## 2017-12-20 DIAGNOSIS — T451X5A Adverse effect of antineoplastic and immunosuppressive drugs, initial encounter: Secondary | ICD-10-CM

## 2017-12-20 LAB — LIPID PANEL
Cholesterol: 198 mg/dL (ref 0–200)
HDL: 58.3 mg/dL (ref >39.00)
LDL Cholesterol: 122 mg/dL — ABNORMAL HIGH (ref 0–99)
NONHDL: 139.76
Total CHOL/HDL Ratio: 3
Triglycerides: 90 mg/dL (ref 0.0–149.0)
VLDL: 18 mg/dL (ref 0.0–40.0)

## 2017-12-20 LAB — CBC WITH DIFFERENTIAL/PLATELET
BASOS PCT: 0.9 % (ref 0.0–3.0)
Basophils Absolute: 0 10*3/uL (ref 0.0–0.1)
EOS ABS: 0.2 10*3/uL (ref 0.0–0.7)
Eosinophils Relative: 4.1 % (ref 0.0–5.0)
HCT: 37.9 % (ref 36.0–46.0)
Hemoglobin: 12.8 g/dL (ref 12.0–15.0)
LYMPHS ABS: 1.7 10*3/uL (ref 0.7–4.0)
Lymphocytes Relative: 30.5 % (ref 12.0–46.0)
MCHC: 33.8 g/dL (ref 30.0–36.0)
MCV: 93.7 fl (ref 78.0–100.0)
MONO ABS: 0.6 10*3/uL (ref 0.1–1.0)
Monocytes Relative: 10.5 % (ref 3.0–12.0)
NEUTROS ABS: 3 10*3/uL (ref 1.4–7.7)
NEUTROS PCT: 54 % (ref 43.0–77.0)
PLATELETS: 218 10*3/uL (ref 150.0–400.0)
RBC: 4.04 Mil/uL (ref 3.87–5.11)
RDW: 13.2 % (ref 11.5–15.5)
WBC: 5.5 10*3/uL (ref 4.0–10.5)

## 2017-12-20 LAB — COMPREHENSIVE METABOLIC PANEL
ALBUMIN: 4.1 g/dL (ref 3.5–5.2)
ALT: 6 U/L (ref 0–35)
AST: 14 U/L (ref 0–37)
Alkaline Phosphatase: 40 U/L (ref 39–117)
BUN: 18 mg/dL (ref 6–23)
CHLORIDE: 105 meq/L (ref 96–112)
CO2: 33 meq/L — AB (ref 19–32)
CREATININE: 1.11 mg/dL (ref 0.40–1.20)
Calcium: 9.7 mg/dL (ref 8.4–10.5)
GFR: 49.95 mL/min — ABNORMAL LOW (ref >60.00)
GLUCOSE: 97 mg/dL (ref 70–99)
POTASSIUM: 4.3 meq/L (ref 3.5–5.1)
SODIUM: 142 meq/L (ref 135–145)
Total Bilirubin: 0.4 mg/dL (ref 0.2–1.2)
Total Protein: 7.1 g/dL (ref 6.0–8.3)

## 2017-12-20 LAB — TSH: TSH: 4.15 u[IU]/mL (ref 0.35–4.50)

## 2017-12-20 NOTE — Patient Instructions (Signed)
Cindy Davies , Thank you for taking time to come for your Medicare Wellness Visit. I appreciate your ongoing commitment to your health goals. Please review the following plan we discussed and let me know if I can assist you in the future.   These are the goals we discussed: Goals    . Increase physical activity     Starting 12/20/2017, I will continue to bowl for 2.5-3 hours weekly.        This is a list of the screening recommended for you and due dates:  Health Maintenance  Topic Date Due  . Flu Shot  07/26/2018*  . Tetanus Vaccine  10/22/2023*  . Mammogram  02/25/2018  . DEXA scan (bone density measurement)  Completed  . Pneumonia vaccines  Completed  *Topic was postponed. The date shown is not the original due date.   Preventive Care for Adults  A healthy lifestyle and preventive care can promote health and wellness. Preventive health guidelines for adults include the following key practices.  . A routine yearly physical is a good way to check with your health care provider about your health and preventive screening. It is a chance to share any concerns and updates on your health and to receive a thorough exam.  . Visit your dentist for a routine exam and preventive care every 6 months. Brush your teeth twice a day and floss once a day. Good oral hygiene prevents tooth decay and gum disease.  . The frequency of eye exams is based on your age, health, family medical history, use  of contact lenses, and other factors. Follow your health care provider's recommendations for frequency of eye exams.  . Eat a healthy diet. Foods like vegetables, fruits, whole grains, low-fat dairy products, and lean protein foods contain the nutrients you need without too many calories. Decrease your intake of foods high in solid fats, added sugars, and salt. Eat the right amount of calories for you. Get information about a proper diet from your health care provider, if necessary.  . Regular physical  exercise is one of the most important things you can do for your health. Most adults should get at least 150 minutes of moderate-intensity exercise (any activity that increases your heart rate and causes you to sweat) each week. In addition, most adults need muscle-strengthening exercises on 2 or more days a week.  Silver Sneakers may be a benefit available to you. To determine eligibility, you may visit the website: www.silversneakers.com or contact program at [phone] Mon-Fri between 8AM-8PM.   . Maintain a healthy weight. The body mass index (BMI) is a screening tool to identify possible weight problems. It provides an estimate of body fat based on height and weight. Your health care provider can find your BMI and can help you achieve or maintain a healthy weight.   For adults 20 years and older: ? A BMI below 18.5 is considered underweight. ? A BMI of 18.5 to 24.9 is normal. ? A BMI of 25 to 29.9 is considered overweight. ? A BMI of 30 and above is considered obese.   . Maintain normal blood lipids and cholesterol levels by exercising and minimizing your intake of saturated fat. Eat a balanced diet with plenty of fruit and vegetables. Blood tests for lipids and cholesterol should begin at age 45 and be repeated every 5 years. If your lipid or cholesterol levels are high, you are over 50, or you are at high risk for heart disease, you may need your  cholesterol levels checked more frequently. Ongoing high lipid and cholesterol levels should be treated with medicines if diet and exercise are not working.  . If you smoke, find out from your health care provider how to quit. If you do not use tobacco, please do not start.  . If you choose to drink alcohol, please do not consume more than 2 drinks per day. One drink is considered to be 12 ounces (355 mL) of beer, 5 ounces (148 mL) of wine, or 1.5 ounces (44 mL) of liquor.  . If you are 3-24 years old, ask your health care provider if you  should take aspirin to prevent strokes.  . Use sunscreen. Apply sunscreen liberally and repeatedly throughout the day. You should seek shade when your shadow is shorter than you. Protect yourself by wearing long sleeves, pants, a wide-brimmed hat, and sunglasses year round, whenever you are outdoors.  . Once a month, do a whole body skin exam, using a mirror to look at the skin on your back. Tell your health care provider of new moles, moles that have irregular borders, moles that are larger than a pencil eraser, or moles that have changed in shape or color.
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
## 2017-12-28 ENCOUNTER — Ambulatory Visit (INDEPENDENT_AMBULATORY_CARE_PROVIDER_SITE_OTHER): Payer: Medicare Other | Admitting: Family Medicine

## 2017-12-28 ENCOUNTER — Encounter: Payer: Self-pay | Admitting: Family Medicine

## 2017-12-28 VITALS — BP 154/76 | HR 67 | Temp 98.8°F | Ht 65.5 in | Wt 157.2 lb

## 2017-12-28 DIAGNOSIS — C541 Malignant neoplasm of endometrium: Secondary | ICD-10-CM

## 2017-12-28 DIAGNOSIS — Z23 Encounter for immunization: Secondary | ICD-10-CM | POA: Diagnosis not present

## 2017-12-28 DIAGNOSIS — E782 Mixed hyperlipidemia: Secondary | ICD-10-CM

## 2017-12-28 DIAGNOSIS — F430 Acute stress reaction: Secondary | ICD-10-CM

## 2017-12-28 DIAGNOSIS — Z0000 Encounter for general adult medical examination without abnormal findings: Secondary | ICD-10-CM | POA: Diagnosis not present

## 2017-12-28 MED ORDER — PAROXETINE HCL 40 MG PO TABS: 40.0000 mg | ORAL_TABLET | Freq: Every day | ORAL | 11 refills | Status: DC

## 2017-12-28 MED ORDER — SIMVASTATIN 40 MG PO TABS: ORAL_TABLET | ORAL | 11 refills | Status: DC

## 2017-12-28 MED ORDER — TOLTERODINE TARTRATE ER 4 MG PO CP24: 4.0000 mg | ORAL_CAPSULE | Freq: Every day | ORAL | 11 refills | Status: DC

## 2017-12-28 NOTE — Assessment & Plan Note (Addendum)
Husband in early stages of alz dementia- with some paranoia but no violent tendancies Disc dealing with this Recommended the 36 hour book  Inc paxil to 40 mg daily  Reviewed stressors/ coping techniques/symptoms/ support sources/ tx options and side effects in detail today  Discussed expectations of SSRI medication including time to effectiveness and mechanism of action, also poss of side effects (early and late)- including mental fuzziness, weight or appetite change, nausea and poss of worse dep or anxiety (even suicidal thoughts)  Pt voiced understanding and will stop med and update if this occurs    Declines counseling at this time

## 2017-12-28 NOTE — Patient Instructions (Addendum)
The book called "the 8 Hour Day" is very helpful for those taking care of a dementia patient  Available at any book store or [place] [facility] [place]   Let's go up on paxil to 40 mg  Let me know if you have any side effects   If you are interested in the new shingles vaccine (Shingrix) - call your local pharmacy to check on coverage and availability  If it is affordable -then get on a waiting list at your pharmacy   You will be due for a mammogram in November   Schedule a follow up for mole removal on your back   Take care of yourself

## 2017-12-28 NOTE — Assessment & Plan Note (Signed)
Disc goals for lipids and reasons to control them Rev last labs with pt Rev low sat fat diet in detail LDL is up- pt confesses to non compliance with her simvastatin Enc better compliance and diet

## 2017-12-28 NOTE — Assessment & Plan Note (Signed)
Doing well s/p tx with upcoming f/u and CT scan

## 2017-12-28 NOTE — Progress Notes (Signed)
Subjective:    Patient ID: Cindy Davies, female    DOB: 1933/07/20, 84 y.o.   MRN: [id number]  HPI  Here for health maintenance exam and to review chronic medical problems    Very stressed  husb diag with early alzheimers (he is paranoid and accuses her of having sex with other men) She is on paxil 20 and would like to inc dose  Support- her oldest son lives close and will listen to her    Wt Readings from Last 3 Encounters:  12/28/17 157 lb 4 oz (71.3 kg)  12/20/17 156 lb 8 oz (71 kg)  10/07/17 157 lb 12.8 oz (71.6 kg)  weight is down  She is eating healthy and appetite is ok  25.77 kg/m   Had amw on 8/27 No concerns   Flu vaccine -had today  Zoster status -has not had vaccine-is interested   Mammogram 11/18  Self breast exam -no lumps   dexa 8/10- BMD in normal range  No falls  No fractures  Takes ca plus D for bones Is active    ifob 4/16 neg  cologuard neg 2/18 Colonoscopy 2010  Personal hx of endometrial cancer  Has next f/u this Thursday and doing very well /finished treatment  Gyn/onc and CT scan are planned  She did really well   Hyperlipidemia Lab Results  Component Value Date   CHOL 198 12/20/2017   CHOL 179 04/13/2017   CHOL 191 05/06/2016   Lab Results  Component Value Date   HDL 58.30 12/20/2017   HDL 61.10 04/13/2017   HDL 63.50 05/06/2016   Lab Results  Component Value Date   LDLCALC 122 (H) 12/20/2017   LDLCALC 96 04/13/2017   LDLCALC 110 (H) 05/06/2016   Lab Results  Component Value Date   TRIG 90.0 12/20/2017   TRIG 110.0 04/13/2017   TRIG 88.0 05/06/2016   Lab Results  Component Value Date   CHOLHDL 3 12/20/2017   CHOLHDL 3 04/13/2017   CHOLHDL 3 05/06/2016   Lab Results  Component Value Date   LDLDIRECT 124.6 07/11/2012   LDLDIRECT 128.9 05/21/2011   LDLDIRECT 202.4 09/09/2010   Simvastatin and diet - missed doses so LDL is up  Eats healthy- avoids fried and fatty foods   Other labs: Results for orders  placed or performed in visit on 12/20/17  TSH  Result Value Ref Range   TSH 4.15 0.35 - 4.50 uIU/mL  Lipid panel  Result Value Ref Range   Cholesterol 198 0 - 200 mg/dL   Triglycerides 90.0 0.0 - 149.0 mg/dL   HDL 58.30 >39.00 mg/dL   VLDL 18.0 0.0 - 40.0 mg/dL   LDL Cholesterol 122 (H) 0 - 99 mg/dL   Total CHOL/HDL Ratio 3    NonHDL 139.76   Comprehensive metabolic panel  Result Value Ref Range   Sodium 142 135 - 145 mEq/L   Potassium 4.3 3.5 - 5.1 mEq/L   Chloride 105 96 - 112 mEq/L   CO2 33 (H) 19 - 32 mEq/L   Glucose, Bld 97 70 - 99 mg/dL   BUN 18 6 - 23 mg/dL   Creatinine, Ser 1.11 0.40 - 1.20 mg/dL   Total Bilirubin 0.4 0.2 - 1.2 mg/dL   Alkaline Phosphatase 40 39 - 117 U/L   AST 14 0 - 37 U/L   ALT 6 0 - 35 U/L   Total Protein 7.1 6.0 - 8.3 g/dL   Albumin 4.1 3.5 - 5.2 g/dL  Calcium 9.7 8.4 - 10.5 mg/dL   GFR 49.95 (L) >60.00 mL/min  CBC with Differential/Platelet  Result Value Ref Range   WBC 5.5 4.0 - 10.5 K/uL   RBC 4.04 3.87 - 5.11 Mil/uL   Hemoglobin 12.8 12.0 - 15.0 g/dL   HCT 37.9 36.0 - 46.0 %   MCV 93.7 78.0 - 100.0 fl   MCHC 33.8 30.0 - 36.0 g/dL   RDW 13.2 11.5 - 15.5 %   Platelets 218.0 150.0 - 400.0 K/uL   Neutrophils Relative % 54.0 43.0 - 77.0 %   Lymphocytes Relative 30.5 12.0 - 46.0 %   Monocytes Relative 10.5 3.0 - 12.0 %   Eosinophils Relative 4.1 0.0 - 5.0 %   Basophils Relative 0.9 0.0 - 3.0 %   Neutro Abs 3.0 1.4 - 7.7 K/uL   Lymphs Abs 1.7 0.7 - 4.0 K/uL   Monocytes Absolute 0.6 0.1 - 1.0 K/uL   Eosinophils Absolute 0.2 0.0 - 0.7 K/uL   Basophils Absolute 0.0 0.0 - 0.1 K/uL     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 No distress.  Well appearing  HENT:  Head: Normocephalic and atraumatic.  Right Ear: External ear normal.  Left Ear: External ear normal.  Mouth/Throat: Oropharynx is clear and moist.  Eyes:  Pupils are equal, round, and reactive to light. Conjunctivae and EOM are normal. No scleral icterus.  Neck: Normal range of motion. Neck supple. No JVD present. Carotid bruit is not present. No thyromegaly present.  Cardiovascular: Normal rate, regular rhythm, normal heart sounds and intact distal pulses. Exam reveals no gallop.  Pulmonary/Chest: Effort normal and breath sounds normal. No respiratory distress. She has no wheezes. She exhibits no tenderness. No breast tenderness, discharge or bleeding.  Abdominal: Soft. Bowel sounds are normal. She exhibits no distension, no abdominal bruit and no mass. There is no tenderness.  Musculoskeletal: Normal range of motion. She exhibits no edema or tenderness.  AFO on R leg Walks well with a cane  Lymphadenopathy:    She has no cervical adenopathy.  Neurological: She is alert. She has normal reflexes. She displays normal reflexes. No cranial nerve deficit. She exhibits normal muscle tone. Coordination normal.  Skin: Skin is warm and dry. No rash noted. No erythema. No pallor.  3 mm keratotic pink skin lesion on upper mid back  Solar lentigines diffusely  Solar aging Few sks   Psychiatric: She has a normal mood and affect.  Pleasant  Discusses stressors candidly           Assessment & Plan:   Problem List Items Addressed This Visit      Genitourinary   Endometrial ca [facility])    Doing well s/p tx with upcoming f/u and CT scan        Other   HYPERLIPIDEMIA    Disc goals for lipids and reasons to control them Rev last labs with pt Rev low sat fat diet in detail LDL is up- pt confesses to non compliance with her simvastatin Enc better compliance and diet       Relevant Medications   simvastatin (ZOCOR) 40 MG tablet   Routine general medical examination at a health care facility - Primary    Reviewed health habits including diet and exercise and skin cancer prevention Reviewed appropriate screening tests for age  Also reviewed  health mt list,  fam hx and immunization status , as well as social and family history   See HPI amw reviewed  Labs reviewed  Disc stress /caregiving - will inc paxil dose Disc better compliance with statin shingrix recommended Mammogram due in nov-pt to schedule       Stress reaction    Husband in early stages of alz dementia- with some paranoia but no violent tendancies Disc dealing with this Recommended the 36 hour book  Inc paxil to 40 mg daily  Reviewed stressors/ coping techniques/symptoms/ support sources/ tx options and side effects in detail today  Discussed expectations of SSRI medication including time to effectiveness and mechanism of action, also poss of side effects (early and late)- including mental fuzziness, weight or appetite change, nausea and poss of worse dep or anxiety (even suicidal thoughts)  Pt voiced understanding and will stop med and update if this occurs    Declines counseling at this time      Relevant Medications   PARoxetine (PAXIL) 40 MG tablet    Other Visit Diagnoses    Need for immunization against influenza       Relevant Orders   Flu Vaccine QUAD 6+ mos PF IM (Fluarix Quad PF) (Completed)

## 2017-12-28 NOTE — Assessment & Plan Note (Signed)
Reviewed health habits including diet and exercise and skin cancer prevention Reviewed appropriate screening tests for age  Also reviewed health mt list, fam hx and immunization status , as well as social and family history   See HPI amw reviewed  Labs reviewed  Disc stress /caregiving - will inc paxil dose Disc better compliance with statin shingrix recommended Mammogram due in nov-pt to schedule

## 2018-01-03 ENCOUNTER — Inpatient Hospital Stay: Payer: Medicare Other | Attending: Genetic Counselor

## 2018-01-03 DIAGNOSIS — Z923 Personal history of irradiation: Secondary | ICD-10-CM | POA: Insufficient documentation

## 2018-01-03 DIAGNOSIS — Z90722 Acquired absence of ovaries, bilateral: Secondary | ICD-10-CM | POA: Diagnosis not present

## 2018-01-03 DIAGNOSIS — Z90710 Acquired absence of both cervix and uterus: Secondary | ICD-10-CM | POA: Insufficient documentation

## 2018-01-03 DIAGNOSIS — C541 Malignant neoplasm of endometrium: Secondary | ICD-10-CM | POA: Diagnosis not present

## 2018-01-03 LAB — CMP (CANCER CENTER ONLY)
ALT: <6 U/L (ref 0–44)
ANION GAP: 8 (ref 5–15)
AST: 17 U/L (ref 15–41)
Albumin: 4.1 g/dL (ref 3.5–5.0)
Alkaline Phosphatase: 47 U/L (ref 38–126)
BILIRUBIN TOTAL: 0.4 mg/dL (ref 0.3–1.2)
BUN: 13 mg/dL (ref 8–23)
CHLORIDE: 102 mmol/L (ref 98–111)
CO2: 30 mmol/L (ref 22–32)
Calcium: 9.7 mg/dL (ref 8.9–10.3)
Creatinine: 1.06 mg/dL — ABNORMAL HIGH (ref 0.44–1.00)
GFR, EST NON AFRICAN AMERICAN: 48 mL/min — AB (ref >60)
GFR, Est AFR Am: 55 mL/min — ABNORMAL LOW (ref >60)
Glucose, Bld: 92 mg/dL (ref 70–99)
POTASSIUM: 4.3 mmol/L (ref 3.5–5.1)
Sodium: 140 mmol/L (ref 135–145)
TOTAL PROTEIN: 7.5 g/dL (ref 6.5–8.1)

## 2018-01-05 ENCOUNTER — Ambulatory Visit (HOSPITAL_COMMUNITY)
Admission: RE | Admit: 2018-01-05 | Discharge: 2018-01-05 | Disposition: A | Discharge status: Home / Self Care | Payer: Medicare Other | Source: Ambulatory Visit | Attending: Obstetrics | Admitting: Obstetrics

## 2018-01-05 DIAGNOSIS — C541 Malignant neoplasm of endometrium: Secondary | ICD-10-CM | POA: Diagnosis present

## 2018-01-05 MED ORDER — IOHEXOL 300 MG/ML  SOLN
100.0000 mL | Freq: Once | INTRAMUSCULAR | Status: AC | PRN
  Administered 2018-01-05: 100 mL via INTRAVENOUS

## 2018-01-06 ENCOUNTER — Encounter: Payer: Self-pay | Admitting: Family Medicine

## 2018-01-06 ENCOUNTER — Ambulatory Visit (INDEPENDENT_AMBULATORY_CARE_PROVIDER_SITE_OTHER): Payer: Medicare Other | Admitting: Family Medicine

## 2018-01-06 VITALS — BP 128/56 | HR 71 | Temp 99.3°F | Ht 65.5 in | Wt 158.5 lb

## 2018-01-06 DIAGNOSIS — L820 Inflamed seborrheic keratosis: Secondary | ICD-10-CM | POA: Diagnosis not present

## 2018-01-06 NOTE — Patient Instructions (Signed)
I froze several seborrheic keratoses on your back - including the inflamed one , also one on your front and right shoulder   Keep the areas clean with soap and water  They will be sore for a while and then heal   Alert me if any problems

## 2018-01-06 NOTE — Assessment & Plan Note (Signed)
R mid back - smaller after scrubbing at home but still scabbed and bothersome  Also anterior L shoulder area  and superior R shoulder-rough and raised   Treated with cryotx gun- freeze and thaw  Aftercare discussed
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
## 2018-01-13 ENCOUNTER — Inpatient Hospital Stay: Payer: Medicare Other

## 2018-01-13 ENCOUNTER — Inpatient Hospital Stay (HOSPITAL_BASED_OUTPATIENT_CLINIC_OR_DEPARTMENT_OTHER): Payer: Medicare Other | Admitting: Obstetrics

## 2018-01-13 ENCOUNTER — Encounter: Payer: Self-pay | Admitting: Obstetrics

## 2018-01-13 VITALS — BP 130/70 | HR 71 | Temp 98.2°F | Resp 20 | Ht 65.5 in | Wt 158.0 lb

## 2018-01-13 DIAGNOSIS — Z90710 Acquired absence of both cervix and uterus: Secondary | ICD-10-CM

## 2018-01-13 DIAGNOSIS — Z923 Personal history of irradiation: Secondary | ICD-10-CM

## 2018-01-13 DIAGNOSIS — C541 Malignant neoplasm of endometrium: Secondary | ICD-10-CM

## 2018-01-13 NOTE — Patient Instructions (Signed)
Return in 3 months We will let you know about today's labwork
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
## 2018-01-14 LAB — CA 125: CANCER ANTIGEN (CA) 125: 13.3 U/mL (ref 0.0–38.1)

## 2018-01-16 ENCOUNTER — Other Ambulatory Visit: Payer: Self-pay | Admitting: Family Medicine

## 2018-01-16 DIAGNOSIS — Z1231 Encounter for screening mammogram for malignant neoplasm of breast: Secondary | ICD-10-CM

## 2018-01-18 ENCOUNTER — Telehealth: Payer: Self-pay

## 2018-01-18 NOTE — Telephone Encounter (Signed)
Outgoing call to patient regarding recent CA 125 results as "13.3" in normal range.  No answer, left VM to call our office, with contact info.

## 2018-01-18 NOTE — Telephone Encounter (Signed)
Outgoing call to patient per Joylene John NP regarding CA 125 results from 01-13-2018, as 13.3 in normal range. Pt voiced understanding.  No other needs per pt at this time.

## 2018-02-27 ENCOUNTER — Ambulatory Visit
Admission: RE | Admit: 2018-02-27 | Discharge: 2018-02-27 | Disposition: A | Discharge status: Home / Self Care | Payer: Medicare Other | Source: Ambulatory Visit | Attending: Family Medicine | Admitting: Family Medicine

## 2018-02-27 DIAGNOSIS — Z1231 Encounter for screening mammogram for malignant neoplasm of breast: Secondary | ICD-10-CM

## 2018-03-15 ENCOUNTER — Telehealth: Payer: Self-pay | Admitting: Family Medicine

## 2018-03-15 NOTE — Telephone Encounter (Signed)
Without DPR not much I can do  I encourage her to follow up with him so I can talk to both of them

## 2018-03-15 NOTE — Telephone Encounter (Signed)
Pt husband stopped in with concerns about Melisssa. He said she is not paying attention to things when driving, forgets to turn off lights, when her bowling day is. He states he is concerned about her and would like to talk with you about her symptoms. No DPR on file, I gave him a copy to take home with him.

## 2018-03-16 NOTE — Telephone Encounter (Signed)
Left VM requesting pt/spouse to call the office back

## 2018-03-16 NOTE — Telephone Encounter (Signed)
Pt notified of spouses concerns and appt scheduled next week pt said she will bring her husband too so he can relay his concerns

## 2018-03-21 ENCOUNTER — Encounter: Payer: Self-pay | Admitting: Family Medicine

## 2018-03-21 ENCOUNTER — Ambulatory Visit (INDEPENDENT_AMBULATORY_CARE_PROVIDER_SITE_OTHER): Payer: Medicare Other | Admitting: Family Medicine

## 2018-03-21 VITALS — BP 144/68 | HR 76 | Temp 98.8°F | Ht 65.5 in | Wt 159.5 lb

## 2018-03-21 DIAGNOSIS — Z636 Dependent relative needing care at home: Secondary | ICD-10-CM
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
## 2018-04-14 ENCOUNTER — Inpatient Hospital Stay: Payer: Medicare Other | Attending: Obstetrics | Admitting: Obstetrics

## 2018-05-29 ENCOUNTER — Encounter: Payer: Self-pay | Admitting: Family Medicine

## 2018-05-29 ENCOUNTER — Ambulatory Visit (INDEPENDENT_AMBULATORY_CARE_PROVIDER_SITE_OTHER): Payer: Medicare Other | Admitting: Family Medicine

## 2018-05-29 VITALS — BP 134/68 | HR 75 | Temp 99.0°F | Ht 65.5 in

## 2018-05-29 DIAGNOSIS — Z636 Dependent relative needing care at home: Secondary | ICD-10-CM

## 2018-05-29 DIAGNOSIS — C541 Malignant neoplasm of endometrium: Secondary | ICD-10-CM | POA: Diagnosis not present

## 2018-05-29 NOTE — Assessment & Plan Note (Signed)
Pt continues to do well with oncol f/u

## 2018-05-29 NOTE — Patient Instructions (Signed)
Self care is very important - especially in the setting of care giving  I do not think your memory or cognition are abnormal for your age   Please keep me updated
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
## 2018-05-29 NOTE — Assessment & Plan Note (Signed)
Pt wanted a visit where I could assure her husband that she is cognitively doing well w/o signs of dementia  Overall she does well  Husband with alzheimer's is paranoid about her cognitive status  She is weathering this well and providing him good care  Stable emotionally as well- if she struggles in the future we can discuss counseling  Plans to continue paxil 40 mg

## 2018-07-12 ENCOUNTER — Ambulatory Visit: Payer: Self-pay | Admitting: Nurse Practitioner

## 2018-08-08 ENCOUNTER — Encounter: Payer: Self-pay | Admitting: Hematology and Oncology

## 2018-12-18 ENCOUNTER — Telehealth: Payer: Self-pay

## 2018-12-18 NOTE — Telephone Encounter (Signed)
Left message to call clinic, needs COVID screen and back door lab info and front door info

## 2018-12-19 ENCOUNTER — Telehealth: Payer: Self-pay | Admitting: Family Medicine

## 2018-12-19 DIAGNOSIS — E782 Mixed hyperlipidemia: Secondary | ICD-10-CM

## 2018-12-19 DIAGNOSIS — Z0000 Encounter for general adult medical examination without abnormal findings: Secondary | ICD-10-CM

## 2018-12-19 NOTE — Telephone Encounter (Signed)
-----   Message from Ellamae Sia sent at 12/12/2018 10:24 AM EDT ----- Regarding: Lab orders for Wednesday, 8.25.20 Patient is scheduled for CPX labs, please order future labs, Thanks , Karna Christmas

## 2018-12-20 ENCOUNTER — Other Ambulatory Visit: Payer: Medicare Other

## 2018-12-20 ENCOUNTER — Other Ambulatory Visit: Payer: Self-pay

## 2018-12-27 ENCOUNTER — Encounter: Payer: Self-pay | Admitting: Family Medicine

## 2018-12-27 ENCOUNTER — Ambulatory Visit (INDEPENDENT_AMBULATORY_CARE_PROVIDER_SITE_OTHER): Payer: Medicare Other | Admitting: Family Medicine

## 2018-12-27 ENCOUNTER — Ambulatory Visit: Payer: Medicare Other

## 2018-12-27 ENCOUNTER — Other Ambulatory Visit: Payer: Self-pay

## 2018-12-27 VITALS — BP 116/60 | HR 63 | Temp 97.9°F | Ht 64.75 in | Wt 152.2 lb

## 2018-12-27 DIAGNOSIS — Z0000 Encounter for general adult medical examination without abnormal findings: Secondary | ICD-10-CM

## 2018-12-27 DIAGNOSIS — Z23 Encounter for immunization: Secondary | ICD-10-CM

## 2018-12-27 DIAGNOSIS — E782 Mixed hyperlipidemia: Secondary | ICD-10-CM | POA: Diagnosis not present

## 2018-12-27 DIAGNOSIS — Z8542 Personal history of malignant neoplasm of other parts of uterus: Secondary | ICD-10-CM | POA: Diagnosis not present

## 2018-12-27 DIAGNOSIS — E2839 Other primary ovarian failure: Secondary | ICD-10-CM

## 2018-12-27 LAB — LIPID PANEL
Cholesterol: 198 mg/dL (ref 0–200)
HDL: 47 mg/dL (ref >39.00)
LDL Cholesterol: 114 mg/dL — ABNORMAL HIGH (ref 0–99)
NonHDL: 150.65
Total CHOL/HDL Ratio: 4
Triglycerides: 183 mg/dL — ABNORMAL HIGH (ref 0.0–149.0)
VLDL: 36.6 mg/dL (ref 0.0–40.0)

## 2018-12-27 LAB — COMPREHENSIVE METABOLIC PANEL
ALT: 7 U/L (ref 0–35)
AST: 18 U/L (ref 0–37)
Albumin: 4.1 g/dL (ref 3.5–5.2)
Alkaline Phosphatase: 43 U/L (ref 39–117)
BUN: 25 mg/dL — ABNORMAL HIGH (ref 6–23)
CO2: 31 mEq/L (ref 19–32)
Calcium: 9.6 mg/dL (ref 8.4–10.5)
Chloride: 104 mEq/L (ref 96–112)
Creatinine, Ser: 0.98 mg/dL (ref 0.40–1.20)
GFR: 54.13 mL/min — ABNORMAL LOW (ref >60.00)
Glucose, Bld: 66 mg/dL — ABNORMAL LOW (ref 70–99)
Potassium: 4.7 mEq/L (ref 3.5–5.1)
Sodium: 142 mEq/L (ref 135–145)
Total Bilirubin: 0.4 mg/dL (ref 0.2–1.2)
Total Protein: 6.9 g/dL (ref 6.0–8.3)

## 2018-12-27 LAB — CBC WITH DIFFERENTIAL/PLATELET
Basophils Absolute: 0.1 10*3/uL (ref 0.0–0.1)
Basophils Relative: 1.3 % (ref 0.0–3.0)
Eosinophils Absolute: 0.3 10*3/uL (ref 0.0–0.7)
Eosinophils Relative: 5.2 % — ABNORMAL HIGH (ref 0.0–5.0)
HCT: 41.5 % (ref 36.0–46.0)
Hemoglobin: 13.9 g/dL (ref 12.0–15.0)
Lymphocytes Relative: 24 % (ref 12.0–46.0)
Lymphs Abs: 1.6 10*3/uL (ref 0.7–4.0)
MCHC: 33.4 g/dL (ref 30.0–36.0)
MCV: 93.3 fl (ref 78.0–100.0)
Monocytes Absolute: 0.5 10*3/uL (ref 0.1–1.0)
Monocytes Relative: 7.4 % (ref 3.0–12.0)
Neutro Abs: 4.1 10*3/uL (ref 1.4–7.7)
Neutrophils Relative %: 62.1 % (ref 43.0–77.0)
Platelets: 249 10*3/uL (ref 150.0–400.0)
RBC: 4.45 Mil/uL (ref 3.87–5.11)
RDW: 13.8 % (ref 11.5–15.5)
WBC: 6.6 10*3/uL (ref 4.0–10.5)

## 2018-12-27 LAB — TSH: TSH: 6.84 u[IU]/mL — ABNORMAL HIGH (ref 0.35–4.50)

## 2018-12-27 MED ORDER — SIMVASTATIN 40 MG PO TABS: ORAL_TABLET | ORAL | 11 refills | Status: DC

## 2018-12-27 MED ORDER — TOLTERODINE TARTRATE ER 4 MG PO CP24: 4.0000 mg | ORAL_CAPSULE | Freq: Every day | ORAL | 11 refills | Status: DC

## 2018-12-27 MED ORDER — PAROXETINE HCL 40 MG PO TABS: 40.0000 mg | ORAL_TABLET | Freq: Every day | ORAL | 11 refills | Status: DC

## 2018-12-27 NOTE — Assessment & Plan Note (Signed)
No longer sees oncology  No problems or re occurance

## 2018-12-27 NOTE — Assessment & Plan Note (Signed)
Baseline dexa ordered No falls or fractures
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
## 2018-12-27 NOTE — Assessment & Plan Note (Signed)
Disc goals for lipids and reasons to control them Rev last labs with pt Rev low sat fat diet in detail Lab today on simvastatin and diet

## 2018-12-27 NOTE — Assessment & Plan Note (Addendum)
Reviewed health habits including diet and exercise and skin cancer prevention Reviewed appropriate screening tests for age  Also reviewed health mt list, fam hx and immunization status , as well as social and family history   See HPI Labs done Flu shot today She will have mammogram in November dexa ordered for baseline -enc ca and D use  Not interested in shingrix  Has an advance directive No cognitive concerns Doing fairly well with care giving stress (husband with alz) Wearing hearing aides and has regular eye exams  Some keratotic areas on back-pt plans to f/u with dermatology Disc sun protection

## 2018-12-27 NOTE — Patient Instructions (Addendum)
Schedule a bone density test at check out  Your mammogram is due in November   Flu shot today   Labs today   Keep eating a healthy diet and stay active  Follow up with dermatology for a skin check (Dr Ledell Peoples office)  You have spots that need checking

## 2018-12-27 NOTE — Assessment & Plan Note (Signed)
Reviewed health habits including diet and exercise and skin cancer prevention Reviewed appropriate screening tests for age  Also reviewed health mt list, fam hx and immunization status , as well as social and family history   See HPI Labs done She will have mammogram in November dexa ordered for baseline -enc ca and D use  Not interested in shingrix  Has an advance directive No cognitive concerns Doing fairly well with care giving stress (husband with alz) Wearing hearing aides and has regular eye exams

## 2018-12-28 ENCOUNTER — Other Ambulatory Visit (INDEPENDENT_AMBULATORY_CARE_PROVIDER_SITE_OTHER): Payer: Medicare Other

## 2018-12-28 DIAGNOSIS — R7989 Other specified abnormal findings of blood chemistry: Secondary | ICD-10-CM | POA: Diagnosis not present

## 2018-12-28 LAB — T4, FREE: Free T4: 0.74 ng/dL (ref 0.60–1.60)

## 2018-12-29 ENCOUNTER — Telehealth: Payer: Self-pay | Admitting: *Deleted

## 2018-12-29 NOTE — Telephone Encounter (Signed)
Pt returning call to office

## 2018-12-29 NOTE — Telephone Encounter (Signed)
Left VM requesting pt to call the office back regarding labs 

## 2019-01-02 NOTE — Telephone Encounter (Signed)
Left 2nd VM requesting pt to call the office back  

## 2019-01-02 NOTE — Telephone Encounter (Signed)
Addressed through results notes  

## 2019-01-02 NOTE — Telephone Encounter (Signed)
Patient returned Shapale's call.  Patient can be reached at [phone].

## 2019-03-21 ENCOUNTER — Inpatient Hospital Stay: Admission: RE | Admit: 2019-03-21 | Payer: Medicare Other | Source: Ambulatory Visit

## 2019-05-21 ENCOUNTER — Other Ambulatory Visit: Payer: Medicare Other

## 2019-09-24 IMAGING — CT CT CHEST W/ CM
2 of 5 series · 12 of 36 positions shown, 15 images · IV contrast (APPLIED)
Comparison: Abdomen pelvis CT on 02/21/2017

CLINICAL DATA: Followup endometrial carcinoma. Followup
indeterminate left lower lobe pulmonary nodule.

EXAM:
CT CHEST, ABDOMEN, AND PELVIS WITH CONTRAST
TECHNIQUE: Multidetector CT imaging of the chest, abdomen and pelvis was
performed following the standard protocol during bolus
administration of intravenous contrast.
CONTRAST:  100mL OMNIPAQUE IOHEXOL 300 MG/ML  SOLN

[Series 2: cap with · axial · 0.73mm/px · z∈[+1034,+1514]mm · 9 of 121 slices shown, 12 images]
[im 13/121  mediastinal]
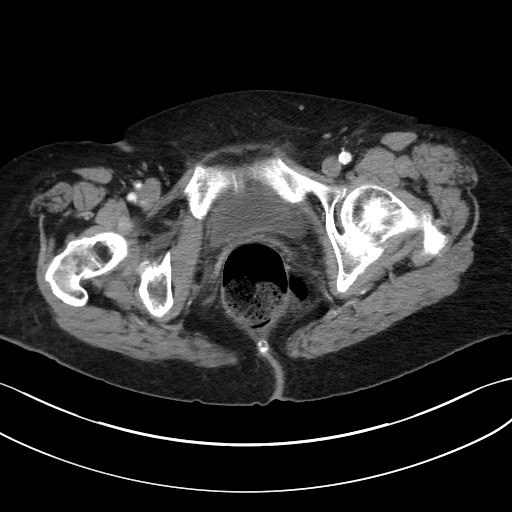
[im 13/121  lung]
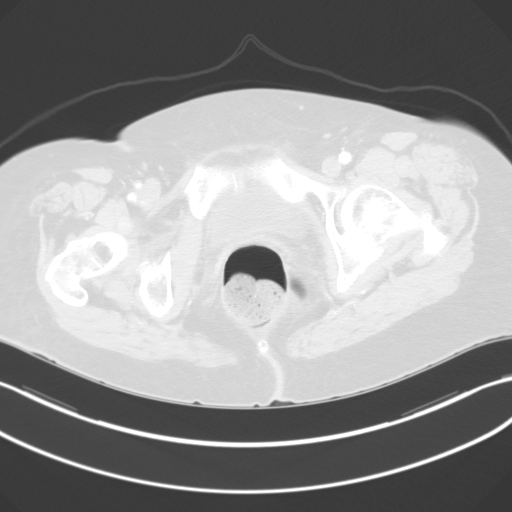
[im 25/121  lung]
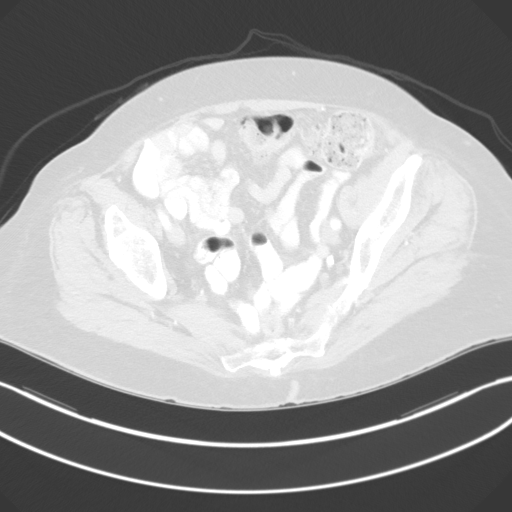
[im 37/121  lung]
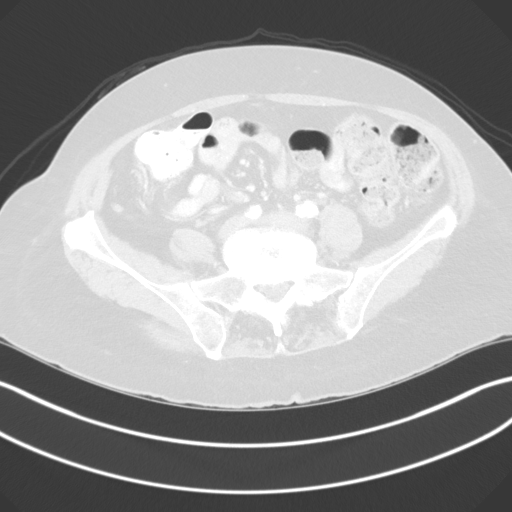
[im 49/121  lung]
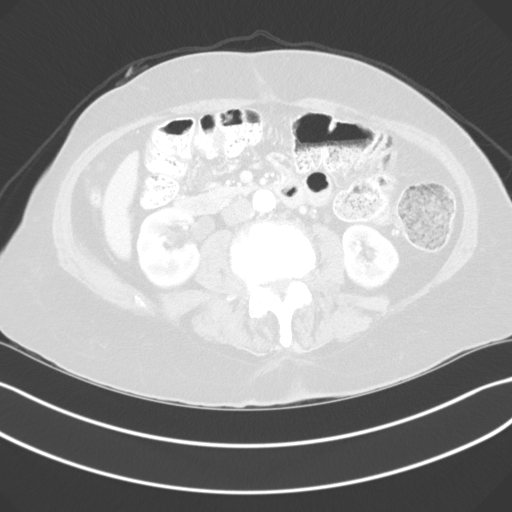
[im 61/121  mediastinal]
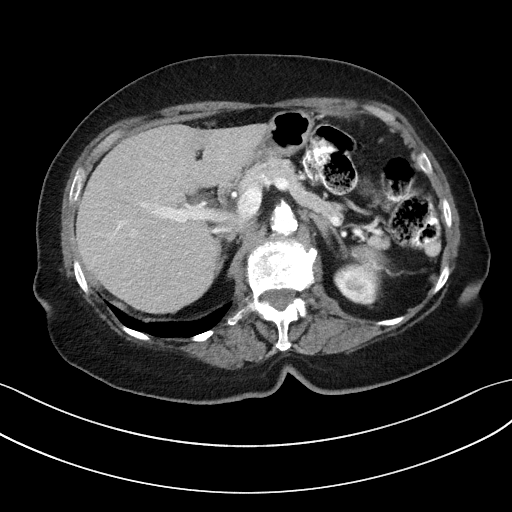
[im 61/121  lung]
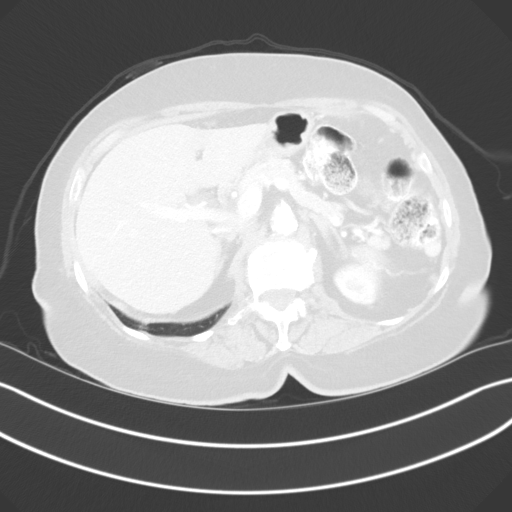
[im 73/121  lung]
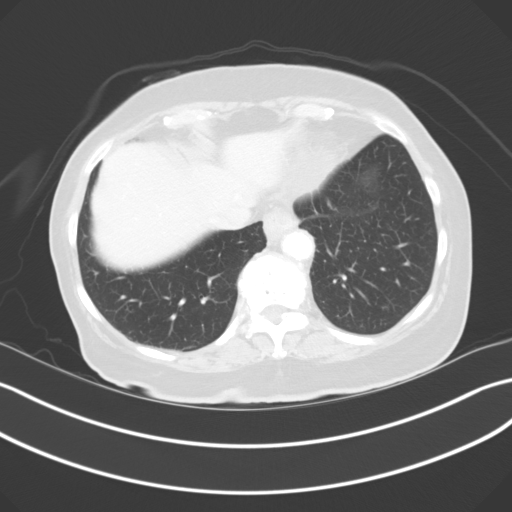
[im 85/121  lung]
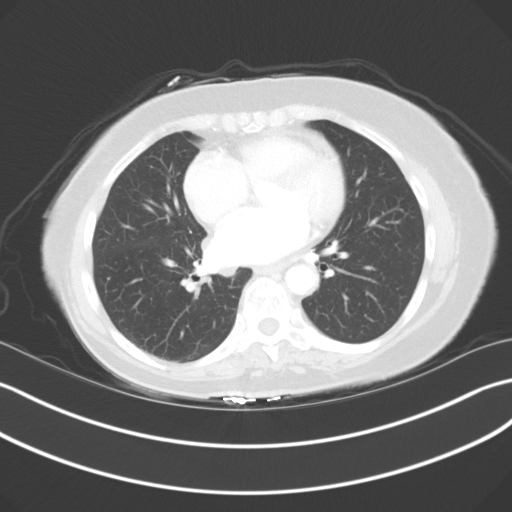
[im 97/121  lung]
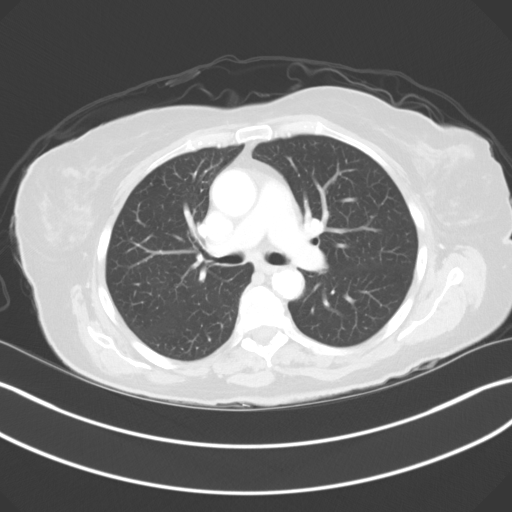
[im 109/121  mediastinal]
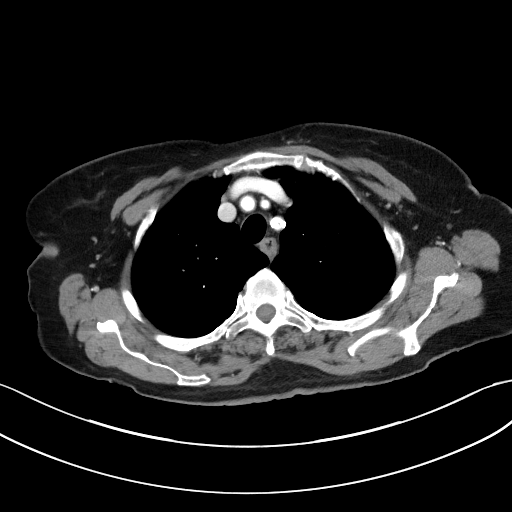
[im 109/121  lung]
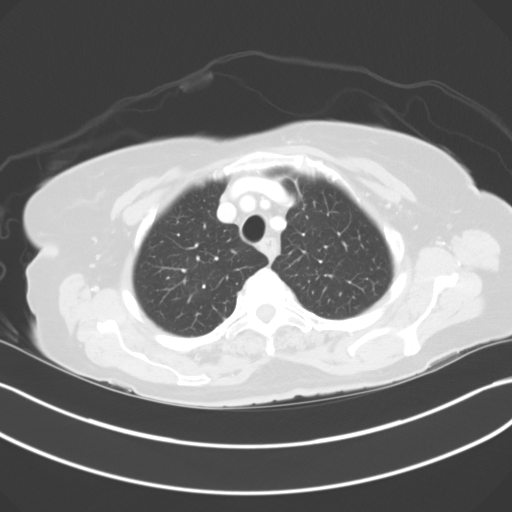

[Series 5: coronals · coronal · 0.77mm/px · 3 of 121 slices shown]
[im 25/121  lung]
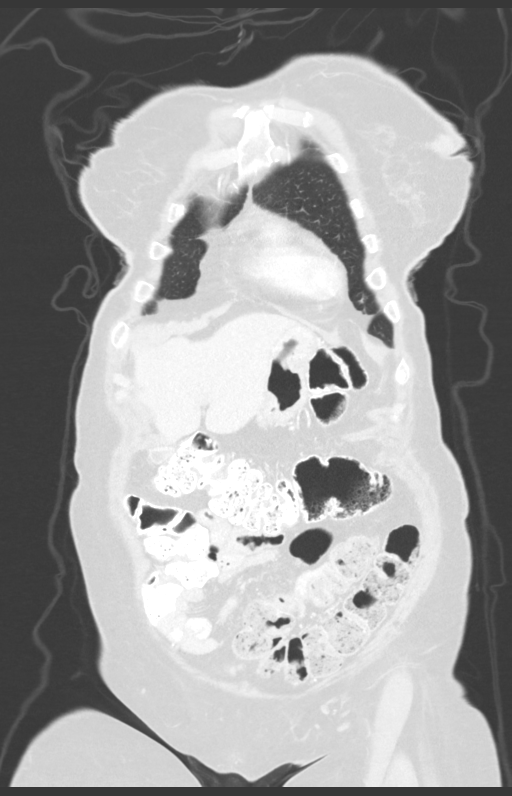
[im 49/121  lung]
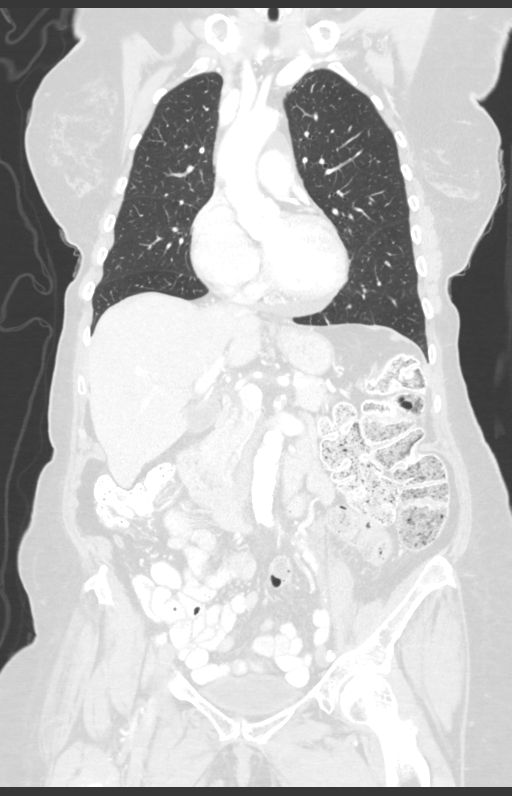
[im 73/121  lung]
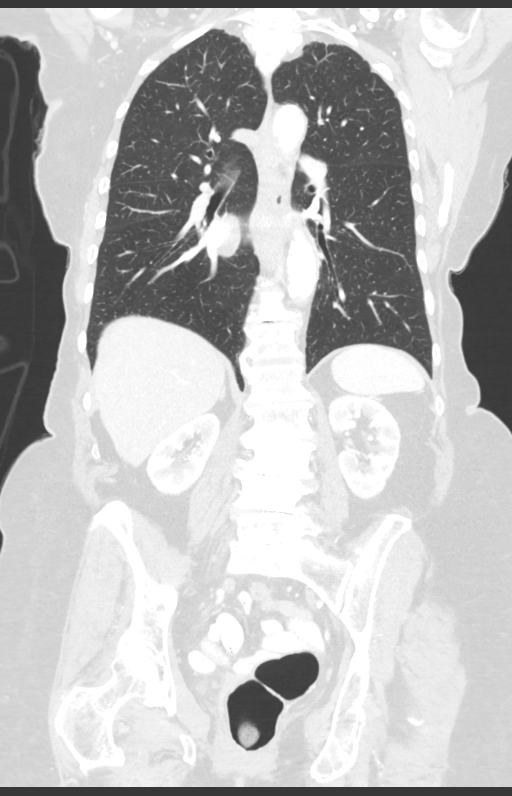

[12 of 36 positions shown; findings below may reference images not displayed]

FINDINGS: CT CHEST FINDINGS

Cardiovascular: No acute findings. Aortic and coronary artery
atherosclerosis.

Mediastinum/Lymph Nodes: No masses or pathologically enlarged lymph
nodes identified.

Lungs/Pleura: No pulmonary infiltrate or mass identified. Tiny left
lower lobe pulmonary nodules measuring up to 5 mm are unchanged
compared to previous abdomen CT. No effusion present.

Musculoskeletal:  No suspicious bone lesions identified.

CT ABDOMEN AND PELVIS FINDINGS

Hepatobiliary: No masses identified. Gallbladder is unremarkable.

Pancreas:  No mass or inflammatory changes.

Spleen:  Within normal limits in size and appearance.

Adrenals/Urinary tract:  No masses or hydronephrosis.

Stomach/Bowel: No evidence of obstruction, inflammatory process, or
abnormal fluid collections. Normal appendix visualized.

Vascular/Lymphatic: No pathologically enlarged lymph nodes
identified. No abdominal aortic aneurysm. Aortic atherosclerosis.

Reproductive: Prior hysterectomy noted. Adnexal regions are
unremarkable in appearance.

Other:  None.

Musculoskeletal:  No suspicious bone lesions identified.
IMPRESSION: No evidence of recurrent or metastatic carcinoma within the chest,
abdomen, or pelvis.

Stable sub-cm indeterminate left lower lobe pulmonary nodules, which
are almost certainly benign. Recommend continued attention on
follow-up imaging.

## 2020-01-23 ENCOUNTER — Other Ambulatory Visit: Payer: Self-pay | Admitting: Family Medicine

## 2020-01-23 NOTE — Telephone Encounter (Signed)
Please schedule fall or winter PE and refill until then 

## 2020-01-23 NOTE — Telephone Encounter (Signed)
Pt hasn't been seen in over a year and no future appts., please advise  

## 2020-01-24 NOTE — Telephone Encounter (Signed)
I left a message on patient's voice mail to return my call. Patient needs to be scheduled for PE in fall or winter.

## 2020-01-24 NOTE — Telephone Encounter (Signed)
Med refilled once and Cindy Davies will reach out to pt to try and get appt scheduled  

## 2020-02-05 ENCOUNTER — Other Ambulatory Visit: Payer: Self-pay | Admitting: Family Medicine

## 2020-06-25 ENCOUNTER — Encounter: Payer: Self-pay | Admitting: Genetic Counselor

## 2020-06-25 NOTE — Progress Notes (Signed)
UPDATE: MSH6 c.[id number]>G (p.Ile608Val) VUS has been reclassified to Likely Benign.  Updated report date is June 20, 2020.

## 2020-09-08 ENCOUNTER — Ambulatory Visit: Payer: Medicare PPO | Admitting: Family Medicine

## 2020-12-25 ENCOUNTER — Ambulatory Visit (INDEPENDENT_AMBULATORY_CARE_PROVIDER_SITE_OTHER): Payer: Medicare PPO | Admitting: Family Medicine

## 2020-12-25 ENCOUNTER — Other Ambulatory Visit: Payer: Self-pay

## 2020-12-25 ENCOUNTER — Encounter: Payer: Self-pay | Admitting: Family Medicine

## 2020-12-25 ENCOUNTER — Telehealth: Payer: Self-pay | Admitting: *Deleted

## 2020-12-25 VITALS — BP 146/80 | HR 76 | Temp 97.5°F | Ht 64.75 in | Wt 119.3 lb

## 2020-12-25 DIAGNOSIS — H9190 Unspecified hearing loss, unspecified ear: Secondary | ICD-10-CM | POA: Insufficient documentation

## 2020-12-25 DIAGNOSIS — R634 Abnormal weight loss: Secondary | ICD-10-CM

## 2020-12-25 DIAGNOSIS — T451X5A Adverse effect of antineoplastic and immunosuppressive drugs, initial encounter: Secondary | ICD-10-CM

## 2020-12-25 DIAGNOSIS — R350 Frequency of micturition: Secondary | ICD-10-CM | POA: Diagnosis not present

## 2020-12-25 DIAGNOSIS — N3941 Urge incontinence: Secondary | ICD-10-CM | POA: Diagnosis not present

## 2020-12-25 DIAGNOSIS — D6481 Anemia due to antineoplastic chemotherapy: Secondary | ICD-10-CM | POA: Diagnosis not present

## 2020-12-25 DIAGNOSIS — H9193 Unspecified hearing loss, bilateral: Secondary | ICD-10-CM | POA: Diagnosis not present

## 2020-12-25 DIAGNOSIS — R413 Other amnesia: Secondary | ICD-10-CM | POA: Diagnosis not present

## 2020-12-25 DIAGNOSIS — E782 Mixed hyperlipidemia: Secondary | ICD-10-CM

## 2020-12-25 DIAGNOSIS — R4189 Other symptoms and signs involving cognitive functions and awareness: Secondary | ICD-10-CM

## 2020-12-25 LAB — LIPID PANEL
Cholesterol: 251 mg/dL — ABNORMAL HIGH (ref 0–200)
HDL: 64.1 mg/dL (ref >39.00)
LDL Cholesterol: 171 mg/dL — ABNORMAL HIGH (ref 0–99)
NonHDL: 186.81
Total CHOL/HDL Ratio: 4
Triglycerides: 81 mg/dL (ref 0.0–149.0)
VLDL: 16.2 mg/dL (ref 0.0–40.0)

## 2020-12-25 LAB — CBC WITH DIFFERENTIAL/PLATELET
Basophils Absolute: 0.1 10*3/uL (ref 0.0–0.1)
Basophils Relative: 0.9 % (ref 0.0–3.0)
Eosinophils Absolute: 0.2 10*3/uL (ref 0.0–0.7)
Eosinophils Relative: 3.2 % (ref 0.0–5.0)
HCT: 40.9 % (ref 36.0–46.0)
Hemoglobin: 13.7 g/dL (ref 12.0–15.0)
Lymphocytes Relative: 24.2 % (ref 12.0–46.0)
Lymphs Abs: 1.7 10*3/uL (ref 0.7–4.0)
MCHC: 33.6 g/dL (ref 30.0–36.0)
MCV: 90.9 fl (ref 78.0–100.0)
Monocytes Absolute: 0.5 10*3/uL (ref 0.1–1.0)
Monocytes Relative: 7.7 % (ref 3.0–12.0)
Neutro Abs: 4.4 10*3/uL (ref 1.4–7.7)
Neutrophils Relative %: 64 % (ref 43.0–77.0)
Platelets: 289 10*3/uL (ref 150.0–400.0)
RBC: 4.49 Mil/uL (ref 3.87–5.11)
RDW: 14.5 % (ref 11.5–15.5)
WBC: 6.9 10*3/uL (ref 4.0–10.5)

## 2020-12-25 LAB — IRON: Iron: 83 ug/dL (ref 42–145)

## 2020-12-25 LAB — COMPREHENSIVE METABOLIC PANEL
ALT: 9 U/L (ref 0–35)
AST: 19 U/L (ref 0–37)
Albumin: 4.2 g/dL (ref 3.5–5.2)
Alkaline Phosphatase: 34 U/L — ABNORMAL LOW (ref 39–117)
BUN: 27 mg/dL — ABNORMAL HIGH (ref 6–23)
CO2: 30 mEq/L (ref 19–32)
Calcium: 10.1 mg/dL (ref 8.4–10.5)
Chloride: 104 mEq/L (ref 96–112)
Creatinine, Ser: 0.95 mg/dL (ref 0.40–1.20)
GFR: 54.62 mL/min — ABNORMAL LOW (ref >60.00)
Glucose, Bld: 95 mg/dL (ref 70–99)
Potassium: 4.6 mEq/L (ref 3.5–5.1)
Sodium: 141 mEq/L (ref 135–145)
Total Bilirubin: 0.6 mg/dL (ref 0.2–1.2)
Total Protein: 7 g/dL (ref 6.0–8.3)

## 2020-12-25 LAB — POC URINALSYSI DIPSTICK (AUTOMATED)
Bilirubin, UA: NEGATIVE
Blood, UA: 50
Glucose, UA: NEGATIVE
Ketones, UA: NEGATIVE
Leukocytes, UA: NEGATIVE
Nitrite, UA: NEGATIVE
Protein, UA: NEGATIVE
Spec Grav, UA: 1.02 (ref 1.010–1.025)
Urobilinogen, UA: 0.2 E.U./dL
pH, UA: 6 (ref 5.0–8.0)

## 2020-12-25 LAB — VITAMIN B12: Vitamin B-12: 119 pg/mL — ABNORMAL LOW (ref 211–911)

## 2020-12-25 LAB — FERRITIN: Ferritin: 73.7 ng/mL (ref 10.0–291.0)

## 2020-12-25 LAB — TSH: TSH: 6.02 u[IU]/mL — ABNORMAL HIGH (ref 0.35–5.50)

## 2020-12-25 LAB — T4, FREE: Free T4: 0.75 ng/dL (ref 0.60–1.60)

## 2020-12-25 LAB — SEDIMENTATION RATE: Sed Rate: 11 mm/hr (ref 0–30)

## 2020-12-25 MED ORDER — PAROXETINE HCL 20 MG PO TABS
20.0000 mg | ORAL_TABLET | Freq: Every day | ORAL | 3 refills | Status: DC
Start: 2020-12-25 — End: 2022-03-25
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
## 2020-12-25 NOTE — Patient Instructions (Addendum)
Labs today  If we can get a urine sample now or later that will help also    I plan to order brain imaging  Also neurology referral  Community care if helpful  You will get calls about all of this    Start paxil 20 mg once daily (evening preferred)  This may help some of the compulsive thoughts Brayton Mars  If any side effects hold it   Stop out front for DPR and to get your contact info for the chart

## 2020-12-25 NOTE — Telephone Encounter (Signed)
Pt's son notified of Dr. Marliss Coots comments and will await labs an urine cx results

## 2020-12-25 NOTE — Telephone Encounter (Signed)
Pt called returning your call 

## 2020-12-25 NOTE — Assessment & Plan Note (Signed)
Profound short term memory loss and cognitive slow down from last visit 2 y ago Suspect dementia /poss alz type  Labs today to r/o organic cause ua  Disc nutrition and fluids  Did not re start detrol LA in light of potential side effects  Will begin paxil (20 mg) for mood and OCD behaviors  Will plan brain imaging and neurol ref

## 2020-12-25 NOTE — Assessment & Plan Note (Signed)
Off detrol in the setting of new dementia  ua today Pos for blood only  cx pending

## 2020-12-25 NOTE — Assessment & Plan Note (Signed)
Lab today.

## 2020-12-25 NOTE — Telephone Encounter (Signed)
-----   Message from Abner Greenspan, MD sent at 12/25/2020  9:35 AM EDT ----- Please let her son know that urine dipped positive for hidden blood so we will get a culture  Other labs pending

## 2020-12-25 NOTE — Telephone Encounter (Signed)
Left VM requesting pt to call the office back 

## 2020-12-25 NOTE — Assessment & Plan Note (Signed)
Pt lost a hearing aid and battery is low in remaining  This may add to cognitive symptoms  Son is aware/carding for her

## 2020-12-25 NOTE — Assessment & Plan Note (Signed)
Over past 12 mo  Also delusions (not hallucinations) OCD behavior (regarding ipad and social media) Hearing loss/missed audiology apt  Also has husband with severe dementia Son lives with and cares for them (DPR signed) Lab today Plan brain imaging and neuro ref

## 2020-12-26 ENCOUNTER — Telehealth: Payer: Self-pay | Admitting: *Deleted

## 2020-12-26 DIAGNOSIS — R634 Abnormal weight loss: Secondary | ICD-10-CM | POA: Insufficient documentation

## 2020-12-26 NOTE — Chronic Care Management (AMB) (Signed)
  Chronic Care Management   Outreach Note  12/26/2020 Name: Cindy Davies MRN: [id number] DOB: [birth year]  Cindy Davies is a 87 y.o. year old female who is a primary care patient of Tower, [name], MD. I reached out to [name] by phone today in response to a referral sent by [name]'s PCP Tower, [name], MD     An unsuccessful telephone outreach was attempted today. The patient was referred to the case management team for assistance with care management and care coordination.   Follow Up Plan: A HIPAA compliant phone message was left for the patient providing contact information and requesting a return call.  If patient returns call to provider office, please advise to call Embedded Care Management Care Guide Heath Tesler at [place], [place] [facility]  Direct Dial: [phone]

## 2020-12-27 LAB — URINE CULTURE
MICRO NUMBER:: 12321915
SPECIMEN QUALITY:: ADEQUATE

## 2020-12-30 NOTE — Chronic Care Management (AMB) (Signed)
  Chronic Care Management   Note  12/30/2020 Name: MOLLEIGH HUOT MRN: [id number] DOB: 05-May-1933  ZULLY FRANE is a 87 y.o. year old female who is a primary care patient of Tower, [name], MD. I reached out to [name] by phone today in response to a referral sent by Ms. Gay Filler Settle's PCP Tower, [name], MD     Ms. Moraes was given information about Chronic Care Management services today including:  CCM service includes personalized support from designated clinical staff supervised by her physician, including individualized plan of care and coordination with other care providers 24/7 contact phone numbers for assistance for urgent and routine care needs. Service will only be billed when office clinical staff spend 20 minutes or more in a month to coordinate care. Only one practitioner may furnish and bill the service in a calendar month. The patient may stop CCM services at any time (effective at the end of the month) by phone call to the office staff. The patient will be responsible for cost sharing (co-pay) of up to 20% of the service fee (after annual deductible is met).  Patient agreed to services and verbal consent obtained.   Follow up plan: Telephone appointment with care management team member scheduled for: 01/09/2021  Julian Hy, [place] [facility]  Direct Dial: [phone]

## 2021-01-08 ENCOUNTER — Ambulatory Visit (INDEPENDENT_AMBULATORY_CARE_PROVIDER_SITE_OTHER): Payer: Medicare PPO | Admitting: *Deleted

## 2021-01-08 DIAGNOSIS — R413 Other amnesia: Secondary | ICD-10-CM

## 2021-01-08 DIAGNOSIS — R4189 Other symptoms and signs involving cognitive functions and awareness: Secondary | ICD-10-CM

## 2021-01-08 DIAGNOSIS — E782 Mixed hyperlipidemia: Secondary | ICD-10-CM

## 2021-01-09 ENCOUNTER — Telehealth: Payer: Medicare PPO

## 2021-01-09 NOTE — Patient Instructions (Signed)
Visit Information  PATIENT GOALS:   Goals Addressed             This Visit's Progress    Find Help in My Community       Timeframe:  Long-Range Goal Priority:  High Start Date:   01/08/21                          Expected End Date:    07/08/21                   Follow Up Date pending   - follow-up on any referrals for help I am given - think ahead to make sure my need does not become an emergency - make a note about what I need to have by the phone or take with me, like an identification card or social security number have a back-up plan - have a back-up plan    Why is this important?   Knowing how and where to find help for yourself or family in your neighborhood and community is an important skill.  You will want to take some steps to learn how.    Notes:         Ms. Lumia was given information about Care Management services by the embedded care coordination team including:  Care Management services include personalized support from designated clinical staff supervised by her physician, including individualized plan of care and coordination with other care providers 24/7 contact phone numbers for assistance for urgent and routine care needs. The patient may stop CCM services at any time (effective at the end of the month) by phone call to the office staff.  Patient agreed to services and verbal consent obtained.   The patient verbalized understanding of instructions, educational materials, and care plan provided today and declined offer to receive copy of patient instructions, educational materials, and care plan.   The care management team will reach out to the patient again over the next 14 business  days.   Elliot Gurney, [facility] [phone]

## 2021-01-09 NOTE — Chronic Care Management (AMB) (Signed)
Chronic Care Management    Clinical Social Work Note  01/09/2021 Name: Cindy Davies MRN: [id number] DOB: 03/24/1934  Cindy Davies is a 87 y.o. year old female who is a primary care patient of Tower, [name], MD. The CCM team was consulted to assist the patient with chronic disease management and/or care coordination needs related to: [employer] .   Collaboration with patient's son  for initial visit in response to provider referral for social work chronic care management and care coordination services.   Consent to Services:  The patient was given the following information about Chronic Care Management services today, agreed to services, and gave verbal consent: 1. CCM service includes personalized support from designated clinical staff supervised by the primary care provider, including individualized plan of care and coordination with other care providers 2. 24/7 contact phone numbers for assistance for urgent and routine care needs. 3. Service will only be billed when office clinical staff spend 20 minutes or more in a month to coordinate care. 4. Only one practitioner may furnish and bill the service in a calendar month. 5.The patient may stop CCM services at any time (effective at the end of the month) by phone call to the office staff. 6. The patient will be responsible for cost sharing (co-pay) of up to 20% of the service fee (after annual deductible is met). Patient agreed to services and consent obtained.  Patient agreed to services and consent obtained.   Assessment: Review of patient past medical history, allergies, medications, and health status, including review of relevant consultants reports was performed today as part of a comprehensive evaluation and provision of chronic care management and care coordination services.     SDOH (Social Determinants of Health) assessments and interventions performed:  SDOH Interventions    Flowsheet Row Most Recent Value  SDOH  Interventions   SDOH Interventions for the Following Domains Social Connections  Social Connections Interventions Other (Comment)        Advanced Directives Status: Not addressed in this encounter.  CCM Care Plan  No Known Allergies  Outpatient Encounter Medications as of 01/08/2021  Medication Sig   acetaminophen (TYLENOL) 325 MG tablet Take 650 mg by mouth. (Patient not taking: Reported on 12/25/2020)   aspirin EC 81 MG tablet Take 81 mg by mouth daily. (Patient not taking: Reported on 12/25/2020)   Misc. Devices (FOLDING [place]) MISC 1 Device by Does not apply route as needed (for use with ambulation as needed). Folding walker with seat and wide webbing for back support and safety for dx: M19.90 and Z91.81 (osteoarthritis and fall risk) (Patient not taking: Reported on 12/25/2020)   Multiple Vitamins-Minerals (CENTRUM SILVER ADULT 50+ PO) Take 1 tablet by mouth daily. (Patient not taking: Reported on 12/25/2020)   PARoxetine (PAXIL) 20 MG tablet Take 1 tablet (20 mg total) by mouth daily.   polyethylene glycol (MIRALAX / GLYCOLAX) packet Take 17 g by mouth as needed.  (Patient not taking: Reported on 12/25/2020)   simvastatin (ZOCOR) 40 MG tablet TAKE ONE TABLET BY MOUTH EVERY DAY AT BEDTIME (Patient not taking: Reported on 12/25/2020)   tolterodine (DETROL LA) 4 MG 24 hr capsule Take 1 capsule (4 mg total) by mouth daily. (Patient not taking: Reported on 12/25/2020)   No facility-administered encounter medications on file as of 01/08/2021.    Patient Active Problem List   Diagnosis Date Noted   Weight loss 12/26/2020   Cognitive decline 12/25/2020   Memory loss 12/25/2020   Hearing  loss 12/25/2020   Estrogen deficiency 12/27/2018   Caregiver stress 03/21/2018   Inflamed seborrheic keratosis 01/06/2018   Genetic testing 05/12/2017   Family history of cancer    Anemia due to antineoplastic chemotherapy 04/28/2017   Paresthesia of bilateral legs 03/30/2017   History of endometrial cancer  02/16/2017   History of cervical fracture 05/17/2016   Routine general medical examination at a health care facility 05/05/2016   Frequent urination 09/30/2015   Constipation 04/29/2015   Risk for falls 08/06/2014   Special screening for malignant neoplasms, colon 07/16/2014   Encounter for Medicare annual wellness exam 10/12/2012   Stress reaction 07/11/2012   HYPERLIPIDEMIA 03/14/2007   INCONTINENCE, URGE 03/14/2007   History of depression 02/08/2007   TINNITUS, CHRONIC 02/08/2007   GERD 02/08/2007   IBS 02/08/2007   OSTEOARTHRITIS 02/08/2007    Conditions to be addressed/monitored:  Memory Loss ; Limited social support and Level of care concerns  Care Plan : General Social Work (Adult)  Updates made by [employer], Darla Lesches, LCSW since 01/09/2021 12:00 AM     Problem: Caregiver Stress      Goal: Caregiver Coping Optimized   Start Date: 01/08/2021  Expected End Date: 07/08/2021  Priority: High  Note:   Current barriers:   Patient's son  in need of assistance with connecting to community resources for Level of care concerns, Memory Deficits, and Lacks knowledge of community resource: related to in home care/level of care options Acknowledges deficits with meeting this unmet need Patient's son is unable to independently navigate community resource options without care coordination support Clinical Goals:  explore community resource options for unmet needs related RD:EYCXKG Deficits  Clinical Interventions:  Collaboration with Tower, [name], MD regarding development and update of comprehensive plan of care as evidenced by provider attestation and co-signature Inter-disciplinary care team collaboration (see longitudinal plan of care) Patient's son discussed that patient's memory is becoming progressively worse, and her care needs have increased-patient's spouse also struggles with Dementia-patient's son discussed being caregiver to them both which has become overwhelming. Patient's  behavior described as "obsessive"-hyper focused on food and television Patient's son currently awaiting referral to Neurologist to confirm diagnosis Patient's son provided with emotional support, caregiver stress acknowledged and need to consider in home supports Review various resources of support including in home care aid(per son he has previously been given a list of providers)  Day Program options, facility care, and the possibility of Hospice/palliative care for patient and spouse  Discussed safety precautions and importance of consistent supervision Obtaining POA encouraged Encouraged verbalization of feelings Active Listening /Reflective utilized Emphasized need for self care and follow through with in home aid for added support and care   -  Follow Up Plan: Practice SW will follow up with patient by phone over the next 14 business days                Follow Up Plan: SW will follow up with patient by phone over the next 14 business days      [employer], [facility] [phone]

## 2021-01-19 ENCOUNTER — Telehealth: Payer: Self-pay | Admitting: Family Medicine

## 2021-01-19 NOTE — Telephone Encounter (Signed)
Not sure what they need will route to referral pool who scheduled MRI

## 2021-01-19 NOTE — Telephone Encounter (Signed)
[facility]indy Davies  [facility] is asking for authorization for a MRI for pt.

## 2021-01-19 NOTE — Telephone Encounter (Signed)
I have left a detailed message providing the auth # and asked her to call me back if she has any questions ///ELEA

## 2021-01-21 ENCOUNTER — Ambulatory Visit: Payer: Medicare PPO

## 2021-01-23 DIAGNOSIS — E782 Mixed hyperlipidemia: Secondary | ICD-10-CM | POA: Diagnosis not present

## 2021-01-23 DIAGNOSIS — R413 Other amnesia: Secondary | ICD-10-CM | POA: Diagnosis not present

## 2021-01-23 DIAGNOSIS — R4189 Other symptoms and signs involving cognitive functions and awareness: Secondary | ICD-10-CM | POA: Diagnosis not present

## 2021-01-26 ENCOUNTER — Ambulatory Visit (INDEPENDENT_AMBULATORY_CARE_PROVIDER_SITE_OTHER): Payer: Medicare PPO | Admitting: *Deleted

## 2021-01-26 DIAGNOSIS — F430 Acute stress reaction: Secondary | ICD-10-CM

## 2021-01-26 DIAGNOSIS — R413 Other amnesia: Secondary | ICD-10-CM

## 2021-01-26 DIAGNOSIS — Z8781 Personal history of (healed) traumatic fracture: Secondary | ICD-10-CM

## 2021-01-26 DIAGNOSIS — Z8659 Personal history of other mental and behavioral disorders: Secondary | ICD-10-CM

## 2021-01-26 DIAGNOSIS — E782 Mixed hyperlipidemia: Secondary | ICD-10-CM

## 2021-01-26 DIAGNOSIS — R4189 Other symptoms and signs involving cognitive functions and awareness: Secondary | ICD-10-CM

## 2021-01-26 DIAGNOSIS — Z9181 History of falling: Secondary | ICD-10-CM

## 2021-01-26 DIAGNOSIS — Z636 Dependent relative needing care at home: Secondary | ICD-10-CM

## 2021-01-26 NOTE — Chronic Care Management (AMB) (Signed)
Chronic Care Management    Clinical Social Work Note  01/26/2021 Name: Cindy Davies MRN: [id number] DOB: [birth year]  Cindy Davies is a 87 y.o. year old female who is a primary care patient of Tower, [name], MD. The CCM team was consulted to assist the patient with chronic disease management and/or care coordination needs related to: Level of Care Concerns and Caregiver Stress.   Engaged with patient by telephone for follow up visit in response to provider referral for social work chronic care management and care coordination services.   Consent to Services:  The patient was given information about Chronic Care Management services, agreed to services, and gave verbal consent prior to initiation of services.  Please see initial visit note for detailed documentation.   Patient agreed to services and consent obtained.   Assessment: Review of patient past medical history, allergies, medications, and health status, including review of relevant consultants reports was performed today as part of a comprehensive evaluation and provision of chronic care management and care coordination services.     SDOH (Social Determinants of Health) assessments and interventions performed:    Advanced Directives Status: See Care Plan for related entries.  CCM Care Plan  No Known Allergies  Outpatient Encounter Medications as of 01/26/2021  Medication Sig   acetaminophen (TYLENOL) 325 MG tablet Take 650 mg by mouth. (Patient not taking: Reported on 12/25/2020)   aspirin EC 81 MG tablet Take 81 mg by mouth daily. (Patient not taking: Reported on 12/25/2020)   Misc. Devices (FOLDING [place]) MISC 1 Device by Does not apply route as needed (for use with ambulation as needed). Folding walker with seat and wide webbing for back support and safety for dx: M19.90 and Z91.81 (osteoarthritis and fall risk) (Patient not taking: Reported on 12/25/2020)   Multiple Vitamins-Minerals (CENTRUM SILVER ADULT 50+ PO) Take 1  tablet by mouth daily. (Patient not taking: Reported on 12/25/2020)   PARoxetine (PAXIL) 20 MG tablet Take 1 tablet (20 mg total) by mouth daily.   polyethylene glycol (MIRALAX / GLYCOLAX) packet Take 17 g by mouth as needed.  (Patient not taking: Reported on 12/25/2020)   simvastatin (ZOCOR) 40 MG tablet TAKE ONE TABLET BY MOUTH EVERY DAY AT BEDTIME (Patient not taking: Reported on 12/25/2020)   tolterodine (DETROL LA) 4 MG 24 hr capsule Take 1 capsule (4 mg total) by mouth daily. (Patient not taking: Reported on 12/25/2020)   No facility-administered encounter medications on file as of 01/26/2021.    Patient Active Problem List   Diagnosis Date Noted   Weight loss 12/26/2020   Cognitive decline 12/25/2020   Memory loss 12/25/2020   Hearing loss 12/25/2020   Estrogen deficiency 12/27/2018   Caregiver stress 03/21/2018   Inflamed seborrheic keratosis 01/06/2018   Genetic testing 05/12/2017   Family history of cancer    Anemia due to antineoplastic chemotherapy 04/28/2017   Paresthesia of bilateral legs 03/30/2017   History of endometrial cancer 02/16/2017   History of cervical fracture 05/17/2016   Routine general medical examination at a health care facility 05/05/2016   Frequent urination 09/30/2015   Constipation 04/29/2015   Risk for falls 08/06/2014   Special screening for malignant neoplasms, colon 07/16/2014   Encounter for Medicare annual wellness exam 10/12/2012   Stress reaction 07/11/2012   HYPERLIPIDEMIA 03/14/2007   INCONTINENCE, URGE 03/14/2007   History of depression 02/08/2007   TINNITUS, CHRONIC 02/08/2007   GERD 02/08/2007   IBS 02/08/2007   OSTEOARTHRITIS 02/08/2007  Conditions to be addressed/monitored: Dementia; Limited social support, Transportation, Level of care concerns, and Limited access to caregiver  Care Plan : LCSW Plan of Care  Updates made by Deirdre Peer, LCSW since 01/26/2021 12:00 AM     Problem: Caregiver Stress      Long-Range Goal:  Caregiver Coping Optimized   Start Date: 01/08/2021  Expected End Date: 07/08/2021  This Visit's Progress: On track  Priority: High  Note:   Current barriers:   Patient's son  in need of assistance with connecting to community resources for Level of care concerns, Memory Deficits, and Lacks knowledge of community resource: related to in home care/level of care options Acknowledges deficits with meeting this unmet need Patient's son is unable to independently navigate community resource options without care coordination support Clinical Goals:  explore community resource options for unmet needs related XA:JOINOM Deficits  Clinical Interventions:  CSW followed up with pt's son today to further assess needs. Per son, pt forgot to call a family member to get ride for MRI so she missed it. CSW encouraged son to reschedule it (per son it is to r/o tumor, diagnose possible dementia). 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  CSW advised son of transportation services/resources through [employer] and [facility]. CSW will place referral for Care Guide to follow up with pt's son re: transportation resources/needs.  Son is quite frustrated with his parents and caregiving for them while he tries to work Radiographer, therapeutic work from home).  "Dad was up at 1:30 this morning wandering the house".  CSW validated the challenges he is facing as sole caregiver and suggested he look into the resources provided for in-home private duty caregiving.  Also, strongly encouraged him to speak to an Oneta Rack about possible Guardianship of dad(pt's spouse) who reports is "totally out of it with dementia".     Collaboration with Tower, [name], MD regarding development and update of comprehensive plan of care as evidenced by provider attestation and co-signature Inter-disciplinary care team collaboration (see longitudinal plan of care) Patient's son discussed that patient's memory is becoming progressively worse, and  her care needs have increased-patient's spouse also struggles with Dementia-patient's son discussed being caregiver to them both which has become overwhelming. Patient's behavior described as "obsessive"-hyper focused on food and television Patient's son currently awaiting referral to Neurologist to confirm diagnosis Patient's son provided with emotional support, caregiver stress acknowledged and need to consider in home supports Review various resources of support including in home care aid(per son he has previously been given a list of providers)  Day Program options, facility care, and the possibility of Hospice/palliative care for patient and spouse  Discussed safety precautions and importance of consistent supervision Obtaining POA encouraged Encouraged verbalization of feelings Active Listening /Reflective utilized Emphasized need for self care and follow through with in home aid for added support and care   Follow Up Plan:  - follow-up on any referrals for help I am given - think ahead to make sure my need does not become an emergency - make a note about what I need to have by the phone or take with me, like an identification card or social security number have a back-up plan - have a back-up plan SW will follow up with patient by phone over the next 4-6 weeks                Follow Up Plan: Appointment scheduled for SW follow up with client  by phone on: 03/02/21      Eduard Clos MSW, [place] Licensed Clinical Social Worker [place] [phone]

## 2021-01-26 NOTE — Patient Instructions (Signed)
Visit Information   PATIENT GOALS:   Goals Addressed             This Visit's Progress    Find Help in My Community       Timeframe:  Long-Range Goal Priority:  High Start Date:   01/08/21                          Expected End Date:    07/08/21                   Follow Up Date 03/02/21  -look into Guardianship/HCPOA options for both -seek private duty caregivers in home -expect call from [facility] regarding transportation needs -reschedule MRI - follow-up on any referrals for help I am given - think ahead to make sure my need does not become an emergency - make a note about what I need to have by the phone or take with me, like an identification card or social security number have a back-up plan - have a back-up plan    Why is this important?   Knowing how and where to find help for yourself or family in your neighborhood and community is an important skill.  You will want to take some steps to learn how.    Notes:         Consent to CCM Services: Ms. Heyde was given information about Chronic Care Management services including:  CCM service includes personalized support from designated clinical staff supervised by her physician, including individualized plan of care and coordination with other care providers 24/7 contact phone numbers for assistance for urgent and routine care needs. Service will only be billed when office clinical staff spend 20 minutes or more in a month to coordinate care. Only one practitioner may furnish and bill the service in a calendar month. The patient may stop CCM services at any time (effective at the end of the month) by phone call to the office staff. The patient will be responsible for cost sharing (co-pay) of up to 20% of the service fee (after annual deductible is met).  Patient agreed to services and verbal consent obtained.   The patient verbalized understanding of instructions, educational materials, and care plan provided today and  declined offer to receive copy of patient instructions, educational materials, and care plan.   Telephone follow up appointment with care management team member scheduled for:03/02/21  Eduard Clos MSW, LCSW Licensed Clinical Social Worker [place] [phone]   CLINICAL CARE PLAN: Patient Care Plan: LCSW Plan of Care     Problem Identified: Caregiver Stress      Long-Range Goal: Caregiver Coping Optimized   Start Date: 01/08/2021  Expected End Date: 07/08/2021  This Visit's Progress: On track  Priority: High  Note:   Current barriers:   Patient's son  in need of assistance with connecting to community resources for Level of care concerns, Memory Deficits, and Lacks knowledge of community resource: related to in home care/level of care options Acknowledges deficits with meeting this unmet need Patient's son is unable to independently navigate community resource options without care coordination support Clinical Goals:  explore community resource options for unmet needs related JM:EQASTM Deficits  Clinical Interventions:  CSW followed up with pt's son today to further assess needs. Per son, pt forgot to call a family member to get ride for MRI so she missed it. CSW encouraged son to reschedule it (per son it is to  r/o tumor, diagnose possible dementia). 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  CSW advised son of transportation services/resources through [employer] and [facility]. CSW will place referral for Care Guide to follow up with pt's son re: transportation resources/needs.  Son is quite frustrated with his parents and caregiving for them while he tries to work Radiographer, therapeutic work from home).  "Dad was up at 1:30 this morning wandering the house".  CSW validated the challenges he is facing as sole caregiver and suggested he look into the resources provided for in-home private duty caregiving.  Also, strongly encouraged him to speak to an Oneta Rack about  possible Guardianship of dad(pt's spouse) who reports is "totally out of it with dementia".     Collaboration with Tower, [name], MD regarding development and update of comprehensive plan of care as evidenced by provider attestation and co-signature Inter-disciplinary care team collaboration (see longitudinal plan of care) Patient's son discussed that patient's memory is becoming progressively worse, and her care needs have increased-patient's spouse also struggles with Dementia-patient's son discussed being caregiver to them both which has become overwhelming. Patient's behavior described as "obsessive"-hyper focused on food and television Patient's son currently awaiting referral to Neurologist to confirm diagnosis Patient's son provided with emotional support, caregiver stress acknowledged and need to consider in home supports Review various resources of support including in home care aid(per son he has previously been given a list of providers)  Day Program options, facility care, and the possibility of Hospice/palliative care for patient and spouse  Discussed safety precautions and importance of consistent supervision Obtaining POA encouraged Encouraged verbalization of feelings Active Listening /Reflective utilized Emphasized need for self care and follow through with in home aid for added support and care   Follow Up Plan:  - follow-up on any referrals for help I am given - think ahead to make sure my need does not become an emergency - make a note about what I need to have by the phone or take with me, like an identification card or social security number have a back-up plan - have a back-up plan SW will follow up with patient by phone over the next 4-6 weeks

## 2021-01-27 ENCOUNTER — Telehealth: Payer: Self-pay

## 2021-01-28 ENCOUNTER — Ambulatory Visit: Payer: Medicare PPO

## 2021-01-28 NOTE — Telephone Encounter (Signed)
   Telephone encounter was:  Successful.  01/28/2021 Name: Cindy Davies MRN: [id number] DOB: 01/21/1934  Cindy Davies is a 87 y.o. year old female who is a primary care patient of Tower, [name], MD . The community resource team was consulted for assistance with Transportation Needs   Care guide performed the following interventions:  Son advised patient does not have any appointments at this time and he is just looking for Transportation information before scheduling appts. He will call the number on moms insurance card and I will email him information for [employer] and [employer].  Follow Up Plan:  Care guide will follow up with patient by phone over the next few days.  [place] management  [place], [place]  Main Phone: [phone]  E-mail: [email or web address]  Website: [email or web address]

## 2021-01-29 ENCOUNTER — Telehealth: Payer: Self-pay

## 2021-01-29 NOTE — Telephone Encounter (Signed)
   Telephone encounter was:  Unsuccessful.  01/29/2021 Name: Cindy Davies MRN: [id number] DOB: 10/09/1933  Unsuccessful outbound call made today to assist with:  Transportation Needs   Outreach Attempt:  1st Attempt  A HIPAA compliant voice message was left requesting a return call.  Instructed patient to call back at [phone] at their earliest convenience.  [place] management  [place], [place]  Main Phone: [phone]  E-mail: [name]  Website: [email or web address]

## 2021-02-02 ENCOUNTER — Telehealth: Payer: Self-pay

## 2021-02-02 NOTE — Telephone Encounter (Signed)
   Telephone encounter was:  Unsuccessful.  02/02/2021 Name: Cindy Davies MRN: [id number] DOB: [birth year]  Unsuccessful outbound call made today to assist with:  Transportation Needs   Outreach Attempt:  2nd Attempt  A HIPAA compliant voice message was left requesting a return call.  Instructed patient to call back at [phone] at their earliest convenience.   [place] management  [place], [place]  Main Phone: [phone]  E-mail: [email or web address]  Website: [email or web address]

## 2021-02-03 ENCOUNTER — Telehealth: Payer: Self-pay

## 2021-02-03 NOTE — Telephone Encounter (Signed)
   Telephone encounter was:  Unsuccessful.  02/03/2021 Name: Cindy Davies MRN: [id number] DOB: Apr 16, 1934  Unsuccessful outbound call made today to assist with:  Transportation Needs   Outreach Attempt:  3rd Attempt.  Referral closed unable to contact patient.  A HIPAA compliant voice message was left requesting a return call.  Instructed patient to call back at [phone] at their earliest convenience. I also sent another e-mail reaching out to see if there was anything else needed for this request.   [name], [name]  Main Phone: [phone]  E-mail: [email or web address]  Website: [email or web address]

## 2021-02-23 DIAGNOSIS — F430 Acute stress reaction: Secondary | ICD-10-CM

## 2021-02-23 DIAGNOSIS — Z9181 History of falling: Secondary | ICD-10-CM

## 2021-02-23 DIAGNOSIS — R4189 Other symptoms and signs involving cognitive functions and awareness: Secondary | ICD-10-CM | POA: Diagnosis not present

## 2021-02-23 DIAGNOSIS — E785 Hyperlipidemia, unspecified: Secondary | ICD-10-CM

## 2021-02-23 DIAGNOSIS — Z8659 Personal history of other mental and behavioral disorders: Secondary | ICD-10-CM

## 2021-02-23 DIAGNOSIS — E782 Mixed hyperlipidemia: Secondary | ICD-10-CM | POA: Diagnosis not present

## 2021-02-23 DIAGNOSIS — Z636 Dependent relative needing care at home: Secondary | ICD-10-CM | POA: Diagnosis not present

## 2021-02-23 DIAGNOSIS — Z8781 Personal history of (healed) traumatic fracture: Secondary | ICD-10-CM

## 2021-02-23 DIAGNOSIS — R413 Other amnesia: Secondary | ICD-10-CM

## 2021-03-02 ENCOUNTER — Telehealth: Payer: Medicare PPO

## 2021-03-03 ENCOUNTER — Telehealth: Payer: Self-pay | Admitting: *Deleted

## 2021-03-03 NOTE — Telephone Encounter (Signed)
  Care Management   Follow Up Note   03/03/2021 Name: Cindy Davies MRN: [id number] DOB: Dec 13, 1933   Referred by: Tower, [name], MD Reason for referral : No chief complaint on file.   An unsuccessful telephone outreach was attempted today. The patient was referred to the case management team for assistance with care management and care coordination.   Follow Up Plan: Telephone follow up appointment with care management team member scheduled for:03/13/21  Eduard Clos MSW, [place] Licensed Clinical Social Worker [place] [phone]

## 2021-03-11 ENCOUNTER — Telehealth: Payer: Self-pay | Admitting: Family Medicine

## 2021-03-11 NOTE — Telephone Encounter (Signed)
Cindy Davies with Adult Protective Services called stating that she had some concerns about Care Taker Neglect with pt. Cindy Davies would like a call back to discuss her concerns.

## 2021-03-11 NOTE — Telephone Encounter (Signed)
I've tried calling back twice and left a message. Will try again tomorrow

## 2021-03-13 ENCOUNTER — Ambulatory Visit (INDEPENDENT_AMBULATORY_CARE_PROVIDER_SITE_OTHER): Payer: Medicare PPO | Admitting: *Deleted

## 2021-03-13 DIAGNOSIS — R413 Other amnesia: Secondary | ICD-10-CM

## 2021-03-13 DIAGNOSIS — F430 Acute stress reaction: Secondary | ICD-10-CM

## 2021-03-13 DIAGNOSIS — E782 Mixed hyperlipidemia: Secondary | ICD-10-CM

## 2021-03-13 DIAGNOSIS — Z636 Dependent relative needing care at home: Secondary | ICD-10-CM

## 2021-03-13 DIAGNOSIS — R4189 Other symptoms and signs involving cognitive functions and awareness: Secondary | ICD-10-CM

## 2021-03-13 DIAGNOSIS — Z9181 History of falling: Secondary | ICD-10-CM

## 2021-03-13 DIAGNOSIS — Z8781 Personal history of (healed) traumatic fracture: Secondary | ICD-10-CM

## 2021-03-13 NOTE — Patient Instructions (Signed)
Visit Information  Thank you for taking time to visit with me today. Please don't hesitate to contact me if I can be of assistance to you before our next scheduled telephone appointment.  Telephone follow up appointment with care management team member scheduled for:  If you need to cancel or re-schedule our visit, please call [phone] and our care guide team will be happy to assist you.  Following is a list of the goals we discussed today:  (Copy and paste patient goals from clinical care plan here)  The patient verbalized understanding of instructions, educational materials, and care plan provided today and declined offer to receive copy of patient instructions, educational materials, and care plan.   Eduard Clos MSW, LCSW Licensed Clinical Social Worker [place] [phone]

## 2021-03-13 NOTE — Chronic Care Management (AMB) (Signed)
Chronic Care Management    Clinical Social Work Note  03/13/2021 Name: Cindy Davies MRN: [id number] DOB: 1933/09/15  LASHAWNA Davies is a 87 y.o. year old female who is a primary care patient of Tower, [name], MD. The CCM team was consulted to assist the patient with chronic disease management and/or care coordination needs related to: [employer] , Level of Care Concerns, and Caregiver Stress.   Engaged with patient by telephone for follow up visit in response to provider referral for social work chronic care management and care coordination services.   Consent to Services:  The patient was given information about Chronic Care Management services, agreed to services, and gave verbal consent prior to initiation of services.  Please see initial visit note for detailed documentation.   Patient agreed to services and consent obtained.   Assessment: Review of patient past medical history, allergies, medications, and health status, including review of relevant consultants reports was performed today as part of a comprehensive evaluation and provision of chronic care management and care coordination services.     SDOH (Social Determinants of Health) assessments and interventions performed:    Advanced Directives Status: Not addressed in this encounter.  CCM Care Plan  No Known Allergies  Outpatient Encounter Medications as of 03/13/2021  Medication Sig   acetaminophen (TYLENOL) 325 MG tablet Take 650 mg by mouth. (Patient not taking: Reported on 12/25/2020)   aspirin EC 81 MG tablet Take 81 mg by mouth daily. (Patient not taking: Reported on 12/25/2020)   Misc. Devices (FOLDING [place]) MISC 1 Device by Does not apply route as needed (for use with ambulation as needed). Folding walker with seat and wide webbing for back support and safety for dx: M19.90 and Z91.81 (osteoarthritis and fall risk) (Patient not taking: Reported on 12/25/2020)   Multiple Vitamins-Minerals (CENTRUM SILVER  ADULT 50+ PO) Take 1 tablet by mouth daily. (Patient not taking: Reported on 12/25/2020)   PARoxetine (PAXIL) 20 MG tablet Take 1 tablet (20 mg total) by mouth daily.   polyethylene glycol (MIRALAX / GLYCOLAX) packet Take 17 g by mouth as needed.  (Patient not taking: Reported on 12/25/2020)   simvastatin (ZOCOR) 40 MG tablet TAKE ONE TABLET BY MOUTH EVERY DAY AT BEDTIME (Patient not taking: Reported on 12/25/2020)   tolterodine (DETROL LA) 4 MG 24 hr capsule Take 1 capsule (4 mg total) by mouth daily. (Patient not taking: Reported on 12/25/2020)   No facility-administered encounter medications on file as of 03/13/2021.    Patient Active Problem List   Diagnosis Date Noted   Weight loss 12/26/2020   Cognitive decline 12/25/2020   Memory loss 12/25/2020   Hearing loss 12/25/2020   Estrogen deficiency 12/27/2018   Caregiver stress 03/21/2018   Inflamed seborrheic keratosis 01/06/2018   Genetic testing 05/12/2017   Family history of cancer    Anemia due to antineoplastic chemotherapy 04/28/2017   Paresthesia of bilateral legs 03/30/2017   History of endometrial cancer 02/16/2017   History of cervical fracture 05/17/2016   Routine general medical examination at a health care facility 05/05/2016   Frequent urination 09/30/2015   Constipation 04/29/2015   Risk for falls 08/06/2014   Special screening for malignant neoplasms, colon 07/16/2014   Encounter for Medicare annual wellness exam 10/12/2012   Stress reaction 07/11/2012   HYPERLIPIDEMIA 03/14/2007   INCONTINENCE, URGE 03/14/2007   History of depression 02/08/2007   TINNITUS, CHRONIC 02/08/2007   GERD 02/08/2007   IBS 02/08/2007   OSTEOARTHRITIS 02/08/2007  Conditions to be addressed/monitored:  care needs ; Limited social support and Limited access to caregiver  Care Plan : LCSW Plan of Care  Updates made by Deirdre Peer, LCSW since 03/13/2021 12:00 AM     Problem: Caregiver Stress      Long-Range Goal: Caregiver  Coping Optimized   Start Date: 01/08/2021  Expected End Date: 07/08/2021  This Visit's Progress: On track  Recent Progress: On track  Priority: High  Note:   Current barriers:   Patient's son  in need of assistance with connecting to community resources for Level of care concerns, Memory Deficits, and Lacks knowledge of community resource: related to in home care/level of care options Acknowledges deficits with meeting this unmet need Patient's son is unable to independently navigate community resource options without care coordination support Clinical Goals:  explore community resource options for unmet needs related [id number] Deficits  Clinical Interventions:  CSW followed up with pt's son today who reports pt's spouse/his father is in hosp[facility] and his death is imminent- [name] is there at his bedside.  CSW offered  emotional support and offered to touch base againn ext month to  further assess pt needs. Per son, he may want to discuss a placement for her at that time- advised son a placement won't happen quickly and that we can provide the steps and resources for them to pursue options.    Collaboration with Tower, [name], MD regarding development and update of comprehensive plan of care as evidenced by provider attestation and co-signature Inter-disciplinary care team collaboration (see longitudinal plan of care) Patient's son discussed that patient's memory is becoming progressively worse, and her care needs have increased-patient's spouse also struggles with Dementia-patient's son discussed being caregiver to them both which has become overwhelming. Patient's behavior described as "obsessive"-hyper focused on food and television Patient's son currently awaiting referral to Neurologist to confirm diagnosis Patient's son provided with emotional support, caregiver stress acknowledged and need to consider in home supports Review various resources of support including  in home care aid(per son he has previously been given a list of providers)  Day Program options, facility care, and the possibility of Hospice/palliative care for patient and spouse  Discussed safety precautions and importance of consistent supervision Obtaining POA encouraged Encouraged verbalization of feelings Active Listening /Reflective utilized Emphasized need for self care and follow through with in home aid for added support and care   Follow Up Plan:  - follow-up on any referrals for help I am given - think ahead to make sure my need does not become an emergency - make a note about what I need to have by the phone or take with me, like an identification card or social security number have a back-up plan - have a back-up plan SW will follow up with patient by phone over the next 4-6 weeks     Follow Up Plan: Appointment scheduled for SW follow up with client by phone on: 04/16/21      Eduard Clos MSW, [place] Licensed Clinical Social Worker [place] [phone]

## 2021-03-13 NOTE — Telephone Encounter (Signed)
Spoke to her  Noted they requested medical records- has anyone seen the request?  Fax is [phone]  Also request FL2 Can you get me a blank one?

## 2021-03-16 ENCOUNTER — Telehealth: Payer: Self-pay | Admitting: Family Medicine

## 2021-03-16 NOTE — Telephone Encounter (Signed)
Please see prior question-has anyone seen request for records?   Do we have a blank FL2 I can fill out for her ?  Thanks

## 2021-03-16 NOTE — Telephone Encounter (Signed)
I have not received any forms but FL2 placed in PCP's inbox for review

## 2021-03-18 NOTE — Telephone Encounter (Signed)
FL2 sent to Social worker, copy of last office notes also sent per medical records request in chart

## 2021-03-25 DIAGNOSIS — E782 Mixed hyperlipidemia: Secondary | ICD-10-CM

## 2021-03-30 ENCOUNTER — Telehealth: Payer: Self-pay | Admitting: Family Medicine

## 2021-03-30 NOTE — Telephone Encounter (Signed)
Talbert Forest with Adult Protective Social Worker called stating she had concern about Pt son Kaleiah Kutzer. Talbert Forest is asking if Dr Glori Bickers would go to court and testify. Please advise.

## 2021-04-08 ENCOUNTER — Telehealth: Payer: Self-pay | Admitting: Family Medicine

## 2021-04-08 NOTE — Telephone Encounter (Signed)
Received call from patient's daughter; requesting for provider to take over care for patient. Current provider moved far away.   States patient's spouse was a patient at this office before he died; Tabytha Gradillas, [place] Sep 16, 2036.  Please advise at [phone].

## 2021-04-09 NOTE — Telephone Encounter (Signed)
Will advise patient.

## 2021-04-10 ENCOUNTER — Other Ambulatory Visit: Payer: Self-pay

## 2021-04-10 ENCOUNTER — Encounter: Payer: Self-pay | Admitting: Family Medicine

## 2021-04-10 ENCOUNTER — Ambulatory Visit (INDEPENDENT_AMBULATORY_CARE_PROVIDER_SITE_OTHER): Payer: Medicare PPO | Admitting: Family Medicine

## 2021-04-10 VITALS — BP 118/60 | HR 89 | Temp 98.1°F | Ht 64.75 in | Wt 117.1 lb

## 2021-04-10 DIAGNOSIS — H9193 Unspecified hearing loss, bilateral: Secondary | ICD-10-CM | POA: Diagnosis not present

## 2021-04-10 DIAGNOSIS — E538 Deficiency of other specified B group vitamins: Secondary | ICD-10-CM | POA: Diagnosis not present

## 2021-04-10 DIAGNOSIS — R3129 Other microscopic hematuria: Secondary | ICD-10-CM

## 2021-04-10 DIAGNOSIS — R413 Other amnesia: Secondary | ICD-10-CM

## 2021-04-10 DIAGNOSIS — R7989 Other specified abnormal findings of blood chemistry: Secondary | ICD-10-CM | POA: Diagnosis not present

## 2021-04-10 DIAGNOSIS — R634 Abnormal weight loss: Secondary | ICD-10-CM

## 2021-04-10 DIAGNOSIS — R4189 Other symptoms and signs involving cognitive functions and awareness: Secondary | ICD-10-CM | POA: Diagnosis not present

## 2021-04-10 DIAGNOSIS — Z8659 Personal history of other mental and behavioral disorders: Secondary | ICD-10-CM

## 2021-04-10 DIAGNOSIS — Z23 Encounter for immunization: Secondary | ICD-10-CM | POA: Diagnosis not present

## 2021-04-10 DIAGNOSIS — F430 Acute stress reaction: Secondary | ICD-10-CM

## 2021-04-10 DIAGNOSIS — E782 Mixed hyperlipidemia: Secondary | ICD-10-CM | POA: Diagnosis not present

## 2021-04-10 DIAGNOSIS — E038 Other specified hypothyroidism: Secondary | ICD-10-CM | POA: Diagnosis not present

## 2021-04-10 LAB — POC URINALSYSI DIPSTICK (AUTOMATED)
Bilirubin, UA: NEGATIVE
Glucose, UA: NEGATIVE
Ketones, UA: NEGATIVE
Leukocytes, UA: NEGATIVE
Nitrite, UA: NEGATIVE
Protein, UA: POSITIVE — AB
Spec Grav, UA: 1.025 (ref 1.010–1.025)
Urobilinogen, UA: 0.2 E.U./dL
pH, UA: 5.5 (ref 5.0–8.0)

## 2021-04-10 LAB — BASIC METABOLIC PANEL
BUN/Creatinine Ratio: 20 (calc) (ref 6–22)
BUN: 24 mg/dL (ref 7–25)
CO2: 27 mmol/L (ref 20–32)
Calcium: 9.2 mg/dL (ref 8.6–10.4)
Chloride: 102 mmol/L (ref 98–110)
Creat: 1.19 mg/dL — ABNORMAL HIGH (ref 0.60–0.95)
Glucose, Bld: 99 mg/dL (ref 65–99)
Potassium: 4.3 mmol/L (ref 3.5–5.3)
Sodium: 139 mmol/L (ref 135–146)

## 2021-04-10 LAB — T4, FREE: Free T4: 1 ng/dL (ref 0.8–1.8)

## 2021-04-10 LAB — TSH: TSH: 4.99 mIU/L — ABNORMAL HIGH (ref 0.40–4.50)

## 2021-04-10 LAB — VITAMIN B12: Vitamin B-12: 374 pg/mL (ref 200–1100)

## 2021-04-10 MED ORDER — DONEPEZIL HCL 5 MG PO TABS: 5.0000 mg | ORAL_TABLET | Freq: Every day | ORAL | 11 refills | Status: DC

## 2021-04-10 MED ORDER — SIMVASTATIN 40 MG PO TABS
ORAL_TABLET | ORAL | 11 refills | Status: DC
Start: 2021-04-10 — End: 2022-04-23

## 2021-04-10 MED ORDER — TOLTERODINE TARTRATE ER 4 MG PO CP24: 4.0000 mg | ORAL_CAPSULE | Freq: Every day | ORAL | 11 refills | Status: DC

## 2021-04-10 MED ORDER — CYANOCOBALAMIN 1000 MCG/ML IJ SOLN
1000.0000 ug | Freq: Once | INTRAMUSCULAR | Status: AC
  Administered 2021-04-10: 1000 ug via INTRAMUSCULAR
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
## 2021-04-10 NOTE — Patient Instructions (Addendum)
Let's check labs and urine  B12 shot also   Proceed with the MRI  (we will make a plan based on that and other results)  Get back on the statin and bladder medicine  Go ahead with grief counseling   Keep up balanced diet  Also good water intake   Start the generic aricept 5 mg  If well tolerated then we can increase to 10 mg in 1-2 months

## 2021-04-12 NOTE — Assessment & Plan Note (Signed)
Last visit microscopic hematuria -then ucx was neg /contaminated  ua ordered today

## 2021-04-12 NOTE — Assessment & Plan Note (Signed)
Much improved since she is no longer caring for husband with dementia  More cognitively sharp and less depressed  Plan to continue paxil 20 mg daily and close monitoring

## 2021-04-12 NOTE — Assessment & Plan Note (Signed)
Short term  In setting of cognitive decline-see A/P  No longer delusional  Discussed trial of aricept

## 2021-04-12 NOTE — Assessment & Plan Note (Signed)
Has hearing aide in today and that helps communication greatly  Her son plans to work on getting new ones as well

## 2021-04-12 NOTE — Assessment & Plan Note (Signed)
Re starting simvastatin today  Disc goals for lipids and reasons to control them Rev last labs with pt Rev low sat fat diet in detail  Diet is also much improved

## 2021-04-12 NOTE — Assessment & Plan Note (Signed)
New Last B12 119 and has been taking oral B12 with mvi  Given shot today after re checking level to see if she is absorbing it

## 2021-04-12 NOTE — Assessment & Plan Note (Signed)
Mildly elevated last visit  Re check this with FT4 today  In setting of cognitive decline and h/o depression

## 2021-04-12 NOTE — Assessment & Plan Note (Signed)
Pt went from eating all junk food to much healthier diet with produce and lean proteins  Nutritional status is better  Lost close to 40 lb in 1/1/2 years and now has stabilized  Will continue to monitor

## 2021-04-12 NOTE — Assessment & Plan Note (Signed)
Much improved mental status today in setting of good routine/less stress (her husband passed) and wearing hearing aide and less depressed (taking paxil) Weight loss has leveled out (eating much healthier) Not delusional  Obsessive behavior much improved as well  Pt states appetite and sleep are much improved  Social work helping caregiver Now son has drivers license back to get her to appts  Given improvement will hold off on neuro visit for now  MRI brain ordered Plan to treat new vit B12 deficiency which should also help  Joint decision made to start aricept 5 mg daily and then if well tolerated inc to 10 in the future  Discussed possible side effects and handout given

## 2021-04-16 ENCOUNTER — Ambulatory Visit (INDEPENDENT_AMBULATORY_CARE_PROVIDER_SITE_OTHER): Payer: Medicare PPO | Admitting: *Deleted

## 2021-04-16 DIAGNOSIS — R413 Other amnesia: Secondary | ICD-10-CM

## 2021-04-16 DIAGNOSIS — R4189 Other symptoms and signs involving cognitive functions and awareness: Secondary | ICD-10-CM

## 2021-04-16 DIAGNOSIS — F430 Acute stress reaction: Secondary | ICD-10-CM

## 2021-04-16 DIAGNOSIS — E782 Mixed hyperlipidemia: Secondary | ICD-10-CM

## 2021-04-16 NOTE — Chronic Care Management (AMB) (Signed)
Chronic Care Management    Clinical Social Work Note  04/16/2021 Name: Cindy Davies MRN: [id number] DOB: 1933-11-17  Cindy Davies is a 87 y.o. year old female who is a primary care patient of Tower, [name], MD. The CCM team was consulted to assist the patient with chronic disease management and/or care coordination needs related to: [employer] , Level of Care Concerns, and Caregiver Stress.   Engaged with patient by telephone for follow up visit in response to provider referral for social work chronic care management and care coordination services.   Consent to Services:  The patient was given information about Chronic Care Management services, agreed to services, and gave verbal consent prior to initiation of services.  Please see initial visit note for detailed documentation.   Patient agreed to services and consent obtained.   Assessment: Review of patient past medical history, allergies, medications, and health status, including review of relevant consultants reports was performed today as part of a comprehensive evaluation and provision of chronic care management and care coordination services.     SDOH (Social Determinants of Health) assessments and interventions performed:    Advanced Directives Status: Not addressed in this encounter.  CCM Care Plan  No Known Allergies  Outpatient Encounter Medications as of 04/16/2021  Medication Sig   acetaminophen (TYLENOL) 325 MG tablet Take 650 mg by mouth every 6 (six) hours as needed.   Cyanocobalamin (B-12 PO) Take 1 tablet by mouth daily at 12 noon.   donepezil (ARICEPT) 5 MG tablet Take 1 tablet (5 mg total) by mouth at bedtime.   Multiple Vitamins-Minerals (CENTRUM SILVER ADULT 50+ PO) Take 1 tablet by mouth daily.   PARoxetine (PAXIL) 20 MG tablet Take 1 tablet (20 mg total) by mouth daily.   polyethylene glycol (MIRALAX / GLYCOLAX) packet Take 17 g by mouth as needed.   simvastatin (ZOCOR) 40 MG tablet TAKE ONE  TABLET BY MOUTH EVERY DAY AT BEDTIME   tolterodine (DETROL LA) 4 MG 24 hr capsule Take 1 capsule (4 mg total) by mouth daily.   No facility-administered encounter medications on file as of 04/16/2021.    Patient Active Problem List   Diagnosis Date Noted   Vitamin B12 deficiency 04/10/2021   Elevated TSH 04/10/2021   Microscopic hematuria 04/10/2021   Weight loss 12/26/2020   Cognitive decline 12/25/2020   Memory loss 12/25/2020   Hearing loss 12/25/2020   Estrogen deficiency 12/27/2018   Genetic testing 05/12/2017   Family history of cancer    Anemia due to antineoplastic chemotherapy 04/28/2017   Paresthesia of bilateral legs 03/30/2017   History of endometrial cancer 02/16/2017   History of cervical fracture 05/17/2016   Routine general medical examination at a health care facility 05/05/2016   Frequent urination 09/30/2015   Constipation 04/29/2015   Risk for falls 08/06/2014   Special screening for malignant neoplasms, colon 07/16/2014   Encounter for Medicare annual wellness exam 10/12/2012   Stress reaction 07/11/2012   HYPERLIPIDEMIA 03/14/2007   INCONTINENCE, URGE 03/14/2007   History of depression 02/08/2007   TINNITUS, CHRONIC 02/08/2007   GERD 02/08/2007   IBS 02/08/2007   OSTEOARTHRITIS 02/08/2007    Conditions to be addressed/monitored: Dementia; Level of care concerns and Limited access to caregiver  Care Plan : LCSW Plan of Care  Updates made by Deirdre Peer, LCSW since 04/16/2021 12:00 AM     Problem: Caregiver Stress      Long-Range Goal: Caregiver Coping Optimized   Start Date:  01/08/2021  Expected End Date: 07/08/2021  This Visit's Progress: On track  Recent Progress: On track  Priority: High  Note:   Current barriers:   Patient's son  in need of assistance with connecting to community resources for Level of care concerns, Memory Deficits, and Lacks knowledge of community resource: related to in home care/level of care  options Acknowledges deficits with meeting this unmet need Patient's son is unable to independently navigate community resource options without care coordination support Clinical Goals:  explore community resource options for unmet needs related UE:KCMKLK Deficits  Clinical Interventions:  CSW followed up with pt's son today who reports pt's spouse/his father passed away the day before Thanksgiving. It has become a lot easier to manage- she is fairly self sufficient; per son, she plays her puzzles, fixes her own lunch, etc. He is feeling less stress about needing help.  CSW offered support and condolences with their loss.  CSW offered to touch base in about 1 month to further assess needs and assist.     Collaboration with Tower, [name], MD regarding development and update of comprehensive plan of care as evidenced by provider attestation and co-signature Inter-disciplinary care team collaboration (see longitudinal plan of care) Patient's son discussed that patient's memory is becoming progressively worse, and her care needs have increased-patient's spouse also struggles with Dementia-patient's son discussed being caregiver to them both which has become overwhelming. Patient's behavior described as "obsessive"-hyper focused on food and television Patient's son currently awaiting referral to Neurologist to confirm diagnosis Patient's son provided with emotional support, caregiver stress acknowledged and need to consider in home supports Review various resources of support including in home care aid(per son he has previously been given a list of providers)  Day Program options, facility care, and the possibility of Hospice/palliative care for patient and spouse  Discussed safety precautions and importance of consistent supervision Obtaining POA encouraged Encouraged verbalization of feelings Active Listening /Reflective utilized Emphasized need for self care and follow through with in home aid  for added support and care   Follow Up Plan:  - follow-up on any referrals for help I am given - think ahead to make sure my need does not become an emergency - make a note about what I need to have by the phone or take with me, like an identification card or social security number have a back-up plan - have a back-up plan SW will follow up with patient by phone over the next 4-6 weeks                Follow Up Plan: Appointment scheduled for SW follow up with client by phone on: 05/14/21 Eduard Clos MSW, [place] Licensed Clinical Social Worker [place] [phone]

## 2021-04-16 NOTE — Patient Instructions (Signed)
Visit Information  Thank you for taking time to visit with me today. Please don't hesitate to contact me if I can be of assistance to you before our next scheduled telephone appointment.  Following are the goals we discussed today:  (Copy and paste patient goals from clinical care plan here)  Our next appointment is by telephone on 05/14/21 at 1115  Please call the care guide team at [phone] if you need to cancel or reschedule your appointment.   If you are experiencing a Mental Health or [place] or need someone to talk to, please call the Suicide and Crisis Lifeline: 988 call the [place] National Suicide Prevention Lifeline: 919-854-3855 or TTY: 786-378-6684 TTY 3323372974) to talk to a trained counselor call 911 Call [facility] 211 for resources    The patient verbalized understanding of instructions, educational materials, and care plan provided today and declined offer to receive copy of patient instructions, educational materials, and care plan.   Eduard Clos MSW, LCSW Licensed Clinical Social Worker [place] [phone]

## 2021-04-22 ENCOUNTER — Ambulatory Visit
Admission: RE | Admit: 2021-04-22 | Discharge: 2021-04-22 | Disposition: A | Discharge status: Home / Self Care | Payer: Medicare PPO | Source: Ambulatory Visit | Attending: Family Medicine | Admitting: Family Medicine

## 2021-04-22 DIAGNOSIS — R413 Other amnesia: Secondary | ICD-10-CM | POA: Diagnosis not present

## 2021-04-22 MED ORDER — GADOBUTROL 1 MMOL/ML IV SOLN
5.0000 mL | Freq: Once | INTRAVENOUS | Status: AC | PRN
  Administered 2021-04-22: 16:00:00 5 mL via INTRAVENOUS

## 2021-04-25 DIAGNOSIS — E782 Mixed hyperlipidemia: Secondary | ICD-10-CM

## 2021-04-27 ENCOUNTER — Telehealth: Payer: Self-pay | Admitting: Family Medicine

## 2021-04-27 DIAGNOSIS — R7989 Other specified abnormal findings of blood chemistry: Secondary | ICD-10-CM

## 2021-04-27 DIAGNOSIS — E782 Mixed hyperlipidemia: Secondary | ICD-10-CM

## 2021-04-27 DIAGNOSIS — E538 Deficiency of other specified B group vitamins: Secondary | ICD-10-CM

## 2021-04-27 MED ORDER — DONEPEZIL HCL 10 MG PO TABS: 10.0000 mg | ORAL_TABLET | Freq: Every day | ORAL | 3 refills | Status: DC

## 2021-04-27 NOTE — Telephone Encounter (Signed)
Sent to the pharmacy   I ordered labs for mid march (3 mo) -please schedule fasting labs then   Follow up in April please  I am going to hold off on a neurology referral for now and will see how she is doing then   If they want me to do one however at any time

## 2021-04-27 NOTE — Telephone Encounter (Signed)
-----   Message from Tammi Sou, [place] sent at 04/24/2021 12:59 PM EST ----- Son (DPR signed) notified of MRI results and Dr. Marliss Coots comments. Son hasn't noticed any issues with Aricept, no side eff and no behavioral side eff. Son is okay increasing med to 10mg  daily. He will give her 2 tabs of the 5mg  until he runs out and would like new Rx sent to [facility] [place]/ [address]

## 2021-04-28 ENCOUNTER — Encounter: Payer: Self-pay | Admitting: Family Medicine

## 2021-04-28 DIAGNOSIS — E038 Other specified hypothyroidism: Secondary | ICD-10-CM | POA: Insufficient documentation
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
## 2021-04-29 ENCOUNTER — Ambulatory Visit (INDEPENDENT_AMBULATORY_CARE_PROVIDER_SITE_OTHER): Payer: Medicare PPO

## 2021-04-29 VITALS — Ht 64.0 in | Wt 117.0 lb

## 2021-04-29 DIAGNOSIS — Z0000 Encounter for general adult medical examination without abnormal findings: Secondary | ICD-10-CM

## 2021-04-29 NOTE — Telephone Encounter (Signed)
Patient's son has called the office to discuss some questions they had during the AWV appointment.  They would like to know when exactly was the pt's last mammogram and if they feel she is due for one?  Also, will she need to get shingles and tetanus shots soon because it has been awhile? Please advise

## 2021-04-29 NOTE — Patient Instructions (Signed)
Cindy Davies , Thank you for taking time to complete your Medicare Wellness Visit. I appreciate your ongoing commitment to your health goals. Please review the following plan we discussed and let me know if I can assist you in the future.   Screening recommendations/referrals: Colonoscopy: no longer required  Mammogram: due, last completed 03/09/18, you would like to discuss with Dr. Heber Belvedere Density: no longer required Recommended yearly ophthalmology/optometry visit for glaucoma screening and checkup Recommended yearly dental visit for hygiene and checkup  Vaccinations: Influenza vaccine: up to date Pneumococcal vaccine: up to date Tdap vaccine: due, last completed 10/22/13, discuss with pharmacy Shingles vaccine: Discuss with pharmacy   Covid-19:newest booster available at your local pharmacy  Advanced directives: copy in chart  Conditions/risks identified: see problem list  Next appointment: Follow up in one year for your annual wellness visit    Preventive Care 65 Years and Older, Female Preventive care refers to lifestyle choices and visits with your health care provider that can promote health and wellness. What does preventive care include? A yearly physical exam. This is also called an annual well check. Dental exams once or twice a year. Routine eye exams. Ask your health care provider how often you should have your eyes checked. Personal lifestyle choices, including: Daily care of your teeth and gums. Regular physical activity. Eating a healthy diet. Avoiding tobacco and drug use. Limiting alcohol use. Practicing safe sex. Taking low-dose aspirin every day. Taking vitamin and mineral supplements as recommended by your health care provider. What happens during an annual well check? The services and screenings done by your health care provider during your annual well check will depend on your age, overall health, lifestyle risk factors, and family history of  disease. Counseling  Your health care provider may ask you questions about your: Alcohol use. Tobacco use. Drug use. Emotional well-being. Home and relationship well-being. Sexual activity. Eating habits. History of falls. Memory and ability to understand (cognition). Work and work Statistician. Reproductive health. Screening  You may have the following tests or measurements: Height, weight, and BMI. Blood pressure. Lipid and cholesterol levels. These may be checked every 5 years, or more frequently if you are over 20 years old. Skin check. Lung cancer screening. You may have this screening every year starting at age 90 if you have a 30-pack-year history of smoking and currently smoke or have quit within the past 15 years. Fecal occult blood test (FOBT) of the stool. You may have this test every year starting at age 26. Flexible sigmoidoscopy or colonoscopy. You may have a sigmoidoscopy every 5 years or a colonoscopy every 10 years starting at age 39. Hepatitis C blood test. Hepatitis B blood test. Sexually transmitted disease (STD) testing. Diabetes screening. This is done by checking your blood sugar (glucose) after you have not eaten for a while (fasting). You may have this done every 1-3 years. Bone density scan. This is done to screen for osteoporosis. You may have this done starting at age 63. Mammogram. This may be done every 1-2 years. Talk to your health care provider about how often you should have regular mammograms. Talk with your health care provider about your test results, treatment options, and if necessary, the need for more tests. Vaccines  Your health care provider may recommend certain vaccines, such as: Influenza vaccine. This is recommended every year. Tetanus, diphtheria, and acellular pertussis (Tdap, Td) vaccine. You may need a Td booster every 10 years. Zoster vaccine. You may need this  after age 30. Pneumococcal 13-valent conjugate (PCV13) vaccine. One  dose is recommended after age 60. Pneumococcal polysaccharide (PPSV23) vaccine. One dose is recommended after age 61. Talk to your health care provider about which screenings and vaccines you need and how often you need them. This information is not intended to replace advice given to you by your health care provider. Make sure you discuss any questions you have with your health care provider. Document Released: 05/09/2015 Document Revised: 12/31/2015 Document Reviewed: 02/11/2015 Elsevier Interactive Patient Education  2017 [place] Prevention in the Home Falls can cause injuries. They can happen to people of all ages. There are many things you can do to make your home safe and to help prevent falls. What can I do on the outside of my home? Regularly fix the edges of walkways and driveways and fix any cracks. Remove anything that might make you trip as you walk through a door, such as a raised step or threshold. Trim any bushes or trees on the path to your home. Use bright outdoor lighting. Clear any walking paths of anything that might make someone trip, such as rocks or tools. Regularly check to see if handrails are loose or broken. Make sure that both sides of any steps have handrails. Any raised decks and porches should have guardrails on the edges. Have any leaves, snow, or ice cleared regularly. Use sand or salt on walking paths during winter. Clean up any spills in your garage right away. This includes oil or grease spills. What can I do in the bathroom? Use night lights. Install grab bars by the toilet and in the tub and shower. Do not use towel bars as grab bars. Use non-skid mats or decals in the tub or shower. If you need to sit down in the shower, use a plastic, non-slip stool. Keep the floor dry. Clean up any water that spills on the floor as soon as it happens. Remove soap buildup in the tub or shower regularly. Attach bath mats securely with double-sided  non-slip rug tape. Do not have throw rugs and other things on the floor that can make you trip. What can I do in the bedroom? Use night lights. Make sure that you have a light by your bed that is easy to reach. Do not use any sheets or blankets that are too big for your bed. They should not hang down onto the floor. Have a firm chair that has side arms. You can use this for support while you get dressed. Do not have throw rugs and other things on the floor that can make you trip. What can I do in the kitchen? Clean up any spills right away. Avoid walking on wet floors. Keep items that you use a lot in easy-to-reach places. If you need to reach something above you, use a strong step stool that has a grab bar. Keep electrical cords out of the way. Do not use floor polish or wax that makes floors slippery. If you must use wax, use non-skid floor wax. Do not have throw rugs and other things on the floor that can make you trip. What can I do with my stairs? Do not leave any items on the stairs. Make sure that there are handrails on both sides of the stairs and use them. Fix handrails that are broken or loose. Make sure that handrails are as long as the stairways. Check any carpeting to make sure that it is firmly attached to the  stairs. Fix any carpet that is loose or worn. Avoid having throw rugs at the top or bottom of the stairs. If you do have throw rugs, attach them to the floor with carpet tape. Make sure that you have a light switch at the top of the stairs and the bottom of the stairs. If you do not have them, ask someone to add them for you. What else can I do to help prevent falls? Wear shoes that: Do not have high heels. Have rubber bottoms. Are comfortable and fit you well. Are closed at the toe. Do not wear sandals. If you use a stepladder: Make sure that it is fully opened. Do not climb a closed stepladder. Make sure that both sides of the stepladder are locked into place. Ask  someone to hold it for you, if possible. Clearly mark and make sure that you can see: Any grab bars or handrails. First and last steps. Where the edge of each step is. Use tools that help you move around (mobility aids) if they are needed. These include: Canes. Walkers. Scooters. Crutches. Turn on the lights when you go into a dark area. Replace any light bulbs as soon as they burn out. Set up your furniture so you have a clear path. Avoid moving your furniture around. If any of your floors are uneven, fix them. If there are any pets around you, be aware of where they are. Review your medicines with your doctor. Some medicines can make you feel dizzy. This can increase your chance of falling. Ask your doctor what other things that you can do to help prevent falls. This information is not intended to replace advice given to you by your health care provider. Make sure you discuss any questions you have with your health care provider. Document Released: 02/06/2009 Document Revised: 09/18/2015 Document Reviewed: 05/17/2014 Elsevier Interactive Patient Education  2017 [employer].

## 2021-04-29 NOTE — Telephone Encounter (Signed)
Mammogram was 02/2018 so she is due for screening  [facility] She can call for an appointment   I would like her to get the shingrix vaccine if it is affordable (pharmacy) We generally defer tetanus shot until needed because medicare does not pay for them, but if she wants one she can check with pharmacy of choice

## 2021-05-01 ENCOUNTER — Telehealth: Payer: Self-pay | Admitting: Family Medicine

## 2021-05-01 NOTE — Telephone Encounter (Signed)
Pt's son notified of Dr. Marliss Coots comments and recommendations. [facility] phone # given to son and he will f/u with pharmacy regarding vaccines

## 2021-05-04 NOTE — Telephone Encounter (Signed)
As Ms Cindy Davies specified can you please reach out on my behalf and let me know ?    The patient has been doing much better, care is great and her mental status is improved. I no longer think there is any cause from concern in terms of neglect.  Thanks

## 2021-05-04 NOTE — Telephone Encounter (Signed)
Talbert Forest from Adult Protective Services [phone]  Called again to get report from Dr. Glori Bickers stating she is needing an urgent call.

## 2021-05-05 NOTE — Telephone Encounter (Signed)
Cindy Davies called back and I spoke with her. She needed to speak with [place] today or she would have to serve [place] court papers. She has not been able to reach anyone with medical records for days.

## 2021-05-05 NOTE — Telephone Encounter (Signed)
Thank you for reaching out to her.

## 2021-05-14 ENCOUNTER — Ambulatory Visit (INDEPENDENT_AMBULATORY_CARE_PROVIDER_SITE_OTHER): Payer: Medicare PPO | Admitting: *Deleted

## 2021-05-14 DIAGNOSIS — Z8659 Personal history of other mental and behavioral disorders: Secondary | ICD-10-CM

## 2021-05-14 DIAGNOSIS — E782 Mixed hyperlipidemia: Secondary | ICD-10-CM

## 2021-05-14 DIAGNOSIS — R413 Other amnesia: Secondary | ICD-10-CM

## 2021-05-14 NOTE — Patient Instructions (Signed)
Visit Information  Thank you for taking time to visit with me today. Please don't hesitate to contact me if I can be of assistance to you      Please call the care guide team at [phone] if you need to cancel or reschedule your appointment.   If you are experiencing a Mental Health or [place] or need someone to talk to, please go to [facility] [address] [place] [phone]) call [phone]   The patient verbalized understanding of instructions, educational materials, and care plan provided today and declined offer to receive copy of patient instructions, educational materials, and care plan.   Eduard Clos MSW, LCSW Licensed Clinical Social Worker [place]   [phone]

## 2021-05-14 NOTE — Chronic Care Management (AMB) (Signed)
Chronic Care Management    Clinical Social Work Note  05/14/2021 Name: LARIZA COTHRON MRN: [id number] DOB: Dec 17, 1933  VINISHA FAXON is a 88 y.o. year old female who is a primary care patient of Tower, [name], MD. The CCM team was consulted to assist the patient with chronic disease management and/or care coordination needs related to: [employer] , Mental Health Counseling and Resources, and Grief Counseling.   Engaged with patient by telephone for follow up visit in response to provider referral for social work chronic care management and care coordination services.   Consent to Services:  The patient was given information about Chronic Care Management services, agreed to services, and gave verbal consent prior to initiation of services.  Please see initial visit note for detailed documentation.   Patient agreed to services and consent obtained.   Assessment: Review of patient past medical history, allergies, medications, and health status, including review of relevant consultants reports was performed today as part of a comprehensive evaluation and provision of chronic care management and care coordination services.     SDOH (Social Determinants of Health) assessments and interventions performed:    Advanced Directives Status: Not addressed in this encounter.  CCM Care Plan  No Known Allergies  Outpatient Encounter Medications as of 05/14/2021  Medication Sig   acetaminophen (TYLENOL) 325 MG tablet Take 650 mg by mouth every 6 (six) hours as needed.   Cyanocobalamin (B-12 PO) Take 1 tablet by mouth daily at 12 noon.   donepezil (ARICEPT) 10 MG tablet Take 1 tablet (10 mg total) by mouth at bedtime.   Multiple Vitamins-Minerals (CENTRUM SILVER ADULT 50+ PO) Take 1 tablet by mouth daily.   PARoxetine (PAXIL) 20 MG tablet Take 1 tablet (20 mg total) by mouth daily.   polyethylene glycol (MIRALAX / GLYCOLAX) packet Take 17 g by mouth as needed.   simvastatin (ZOCOR) 40 MG  tablet TAKE ONE TABLET BY MOUTH EVERY DAY AT BEDTIME   tolterodine (DETROL LA) 4 MG 24 hr capsule Take 1 capsule (4 mg total) by mouth daily.   No facility-administered encounter medications on file as of 05/14/2021.    Patient Active Problem List   Diagnosis Date Noted   Subclinical hypothyroidism 04/28/2021   Vitamin B12 deficiency 04/10/2021   Elevated TSH 04/10/2021   Microscopic hematuria 04/10/2021   Weight loss 12/26/2020   Cognitive decline 12/25/2020   Memory loss 12/25/2020   Hearing loss 12/25/2020   Estrogen deficiency 12/27/2018   Genetic testing 05/12/2017   Family history of cancer    Anemia due to antineoplastic chemotherapy 04/28/2017   Paresthesia of bilateral legs 03/30/2017   History of endometrial cancer 02/16/2017   History of cervical fracture 05/17/2016   Routine general medical examination at a health care facility 05/05/2016   Frequent urination 09/30/2015   Constipation 04/29/2015   Risk for falls 08/06/2014   Special screening for malignant neoplasms, colon 07/16/2014   Encounter for Medicare annual wellness exam 10/12/2012   Stress reaction 07/11/2012   HYPERLIPIDEMIA 03/14/2007   INCONTINENCE, URGE 03/14/2007   History of depression 02/08/2007   TINNITUS, CHRONIC 02/08/2007   GERD 02/08/2007   IBS 02/08/2007   OSTEOARTHRITIS 02/08/2007    Conditions to be addressed/monitored: Depression; Limited social support, Mental Health Concerns , and Limited access to caregiver  Care Plan : LCSW Plan of Care  Updates made by Deirdre Peer, LCSW since 05/14/2021 12:00 AM     Problem: Caregiver Stress  Long-Range Goal: Caregiver Coping Optimized Completed 05/14/2021  Start Date: 01/08/2021  Expected End Date: 07/08/2021  This Visit's Progress: On track  Recent Progress: On track  Priority: High  Note:   Current barriers:   Patient's son  in need of assistance with connecting to community resources for Level of care concerns, Memory Deficits,  and Lacks knowledge of community resource: related to in home care/level of care options Acknowledges deficits with meeting this unmet need Patient's son is unable to independently navigate community resource options without care coordination support Clinical Goals:  explore community resource options for unmet needs related XL:[id number] Deficits  Clinical Interventions:  CSW followed up with pt's son today who reports pt is doing better- even went to grief counseling since losing her spouse. Son indicates no concerns or needs at this time- she is managing independently and "sitting with her kitty watching tv". CSW discussed plans to sign off and provided son with # to call if needs arise.  Collaboration with Tower, [name], MD regarding development and update of comprehensive plan of care as evidenced by provider attestation and co-signature Inter-disciplinary care team collaboration (see longitudinal plan of care) Patient's son discussed that patient's memory is becoming progressively worse, and her care needs have increased-patient's spouse also struggles with Dementia-patient's son discussed being caregiver to them both which has become overwhelming. Patient's behavior described as "obsessive"-hyper focused on food and television Patient's son currently awaiting referral to Neurologist to confirm diagnosis Patient's son provided with emotional support, caregiver stress acknowledged and need to consider in home supports Review various resources of support including in home care aid(per son he has previously been given a list of providers)  Day Program options, facility care, and the possibility of Hospice/palliative care for patient and spouse  Discussed safety precautions and importance of consistent supervision Obtaining POA encouraged Encouraged verbalization of feelings Active Listening /Reflective utilized Emphasized need for self care and follow through with in home aid for added support  and care   Follow Up Plan:  - follow-up on any referrals for help I am given - think ahead to make sure my need does not become an emergency - make a note about what I need to have by the phone or take with me, like an identification card or social security number have a back-up plan - have a back-up plan              Follow Up Plan: Client will reach out if needs arise      Eduard Clos MSW, LCSW Licensed Clinical Social Worker [place]   [phone]

## 2021-05-19 ENCOUNTER — Other Ambulatory Visit: Payer: Self-pay | Admitting: Family Medicine

## 2021-05-19 DIAGNOSIS — Z1231 Encounter for screening mammogram for malignant neoplasm of breast: Secondary | ICD-10-CM

## 2021-05-26 DIAGNOSIS — E782 Mixed hyperlipidemia: Secondary | ICD-10-CM

## 2021-06-03 ENCOUNTER — Ambulatory Visit
Admission: RE | Admit: 2021-06-03 | Discharge: 2021-06-03 | Disposition: A | Discharge status: Home / Self Care | Payer: Medicare PPO | Source: Ambulatory Visit | Attending: Family Medicine | Admitting: Family Medicine

## 2021-06-03 DIAGNOSIS — Z1231 Encounter for screening mammogram for malignant neoplasm of breast: Secondary | ICD-10-CM

## 2021-06-04 ENCOUNTER — Telehealth: Payer: Self-pay | Admitting: Family Medicine

## 2021-06-04 NOTE — Telephone Encounter (Signed)
Mr. Kowalczyk called in due to yesterday his mom went to go get her mammogram and she fainted but the technician caught her, and wanted to know she needed to reschedule the mammogram or should she see Dr. Glori Bickers for the situation

## 2021-06-04 NOTE — Telephone Encounter (Signed)
Yes, please re schedule and come to see me when able.  If this re occurs please go to ER if needed. If any other new symptoms alert me Thanks for letting me know

## 2021-06-05 NOTE — Telephone Encounter (Signed)
Pt's son notified of Dr. Marliss Coots comments and verbalized understanding. ER precautions given, and son with r/s mammogram appt when able. F/u with PCP scheduled on Monday

## 2021-06-08 ENCOUNTER — Encounter: Payer: Self-pay | Admitting: Family Medicine

## 2021-06-08 ENCOUNTER — Ambulatory Visit (INDEPENDENT_AMBULATORY_CARE_PROVIDER_SITE_OTHER): Payer: Medicare PPO | Admitting: Family Medicine

## 2021-06-08 ENCOUNTER — Other Ambulatory Visit: Payer: Self-pay

## 2021-06-08 DIAGNOSIS — R55 Syncope and collapse: Secondary | ICD-10-CM

## 2021-06-08 NOTE — Patient Instructions (Addendum)
Exercise /pedaling would be a good option for exercise  I want you to get stronger   If you don't think the bladder medicine helps much-go a few days without it and see how you do    Try and see if she can do a mammogram sitting or another position  Make sure she eats and drinks fluids the day of mammogram   If dizziness or fainting occur again please let me know   Keep me posted   Let me know if you would be open to home physical therapy    A wheeled trainer/pedaler to use in a chair may be helpful

## 2021-06-08 NOTE — Assessment & Plan Note (Addendum)
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
## 2021-06-12 ENCOUNTER — Ambulatory Visit
Admission: RE | Admit: 2021-06-12 | Discharge: 2021-06-12 | Disposition: A | Discharge status: Home / Self Care | Payer: Medicare PPO | Source: Ambulatory Visit | Attending: Family Medicine | Admitting: Family Medicine

## 2021-06-12 DIAGNOSIS — Z1231 Encounter for screening mammogram for malignant neoplasm of breast: Secondary | ICD-10-CM | POA: Diagnosis not present

## 2021-06-30 ENCOUNTER — Other Ambulatory Visit (INDEPENDENT_AMBULATORY_CARE_PROVIDER_SITE_OTHER): Payer: Medicare PPO

## 2021-06-30 ENCOUNTER — Other Ambulatory Visit: Payer: Self-pay

## 2021-06-30 DIAGNOSIS — E538 Deficiency of other specified B group vitamins: Secondary | ICD-10-CM

## 2021-06-30 DIAGNOSIS — E782 Mixed hyperlipidemia: Secondary | ICD-10-CM

## 2021-06-30 DIAGNOSIS — R3129 Other microscopic hematuria: Secondary | ICD-10-CM

## 2021-06-30 DIAGNOSIS — R8290 Unspecified abnormal findings in urine: Secondary | ICD-10-CM | POA: Diagnosis not present

## 2021-06-30 DIAGNOSIS — R7989 Other specified abnormal findings of blood chemistry: Secondary | ICD-10-CM

## 2021-06-30 DIAGNOSIS — R4189 Other symptoms and signs involving cognitive functions and awareness: Secondary | ICD-10-CM

## 2021-06-30 LAB — POC URINALSYSI DIPSTICK (AUTOMATED)
Blood, UA: 25
Glucose, UA: NEGATIVE
Ketones, UA: NEGATIVE
Nitrite, UA: POSITIVE
Protein, UA: POSITIVE — AB
Spec Grav, UA: 1.015 (ref 1.010–1.025)
Urobilinogen, UA: 0.2 E.U./dL
pH, UA: 6 (ref 5.0–8.0)

## 2021-06-30 LAB — BASIC METABOLIC PANEL
BUN: 26 mg/dL — ABNORMAL HIGH (ref 6–23)
CO2: 32 mEq/L (ref 19–32)
Calcium: 8.8 mg/dL (ref 8.4–10.5)
Chloride: 104 mEq/L (ref 96–112)
Creatinine, Ser: 1.04 mg/dL (ref 0.40–1.20)
GFR: 48.82 mL/min — ABNORMAL LOW (ref >60.00)
Glucose, Bld: 109 mg/dL — ABNORMAL HIGH (ref 70–99)
Potassium: 4 mEq/L (ref 3.5–5.1)
Sodium: 142 mEq/L (ref 135–145)

## 2021-06-30 LAB — LIPID PANEL
Cholesterol: 166 mg/dL (ref 0–200)
HDL: 53.8 mg/dL (ref >39.00)
LDL Cholesterol: 91 mg/dL (ref 0–99)
NonHDL: 112.63
Total CHOL/HDL Ratio: 3
Triglycerides: 109 mg/dL (ref 0.0–149.0)
VLDL: 21.8 mg/dL (ref 0.0–40.0)

## 2021-06-30 LAB — TSH: TSH: 6.46 u[IU]/mL — ABNORMAL HIGH (ref 0.35–5.50)

## 2021-06-30 LAB — VITAMIN B12: Vitamin B-12: 610 pg/mL (ref 211–911)

## 2021-06-30 NOTE — Addendum Note (Signed)
Addended by: Tammi Sou on: 06/30/2021 10:54 AM ? ? Modules accepted: Orders ? ?

## 2021-07-01 LAB — URINE CULTURE
MICRO NUMBER:: 13098235
Result:: NO GROWTH
SPECIMEN QUALITY:: ADEQUATE

## 2021-07-04 NOTE — Addendum Note (Signed)
Addended by: Loura Pardon A on: 07/04/2021 04:15 PM ? ? Modules accepted: Orders ? ?

## 2021-07-15 ENCOUNTER — Other Ambulatory Visit: Payer: Self-pay

## 2021-07-15 ENCOUNTER — Encounter: Payer: Self-pay | Admitting: Urology

## 2021-07-15 ENCOUNTER — Ambulatory Visit (INDEPENDENT_AMBULATORY_CARE_PROVIDER_SITE_OTHER): Payer: Medicare PPO | Admitting: Urology

## 2021-07-15 VITALS — BP 114/64 | HR 78 | Ht 68.0 in | Wt 116.0 lb

## 2021-07-15 DIAGNOSIS — R32 Unspecified urinary incontinence: Secondary | ICD-10-CM

## 2021-07-15 DIAGNOSIS — R8281 Pyuria: Secondary | ICD-10-CM | POA: Diagnosis not present

## 2021-07-15 DIAGNOSIS — R319 Hematuria, unspecified: Secondary | ICD-10-CM | POA: Diagnosis not present

## 2021-07-15 DIAGNOSIS — R310 Gross hematuria: Secondary | ICD-10-CM | POA: Diagnosis not present

## 2021-07-15 MED ORDER — MIRABEGRON ER 25 MG PO TB24: 25.0000 mg | ORAL_TABLET | Freq: Every day | ORAL | 0 refills | Status: DC
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
## 2021-07-16 LAB — URINALYSIS, COMPLETE
Bilirubin, UA: NEGATIVE
Glucose, UA: NEGATIVE
Ketones, UA: NEGATIVE
Nitrite, UA: NEGATIVE
Specific Gravity, UA: 1.02 (ref 1.005–1.030)
Urobilinogen, Ur: 0.2 mg/dL (ref 0.2–1.0)
pH, UA: 5.5 (ref 5.0–7.5)

## 2021-07-16 LAB — MICROSCOPIC EXAMINATION

## 2021-07-20 ENCOUNTER — Other Ambulatory Visit: Payer: Self-pay | Admitting: *Deleted

## 2021-07-20 ENCOUNTER — Telehealth: Payer: Self-pay | Admitting: *Deleted

## 2021-07-20 LAB — CULTURE, URINE COMPREHENSIVE

## 2021-07-20 MED ORDER — SULFAMETHOXAZOLE-TRIMETHOPRIM 800-160 MG PO TABS: 1.0000 | ORAL_TABLET | Freq: Two times a day (BID) | ORAL | 0 refills | Status: DC

## 2021-07-20 NOTE — Telephone Encounter (Signed)
-----   Message from Abbie Sons, MD sent at 07/20/2021  9:50 AM EDT ----- ?Urine culture was positive.  Please send in Rx Septra DS 1 tab twice daily x7 days.  Repeat urinalysis/lab visit ~ 1 week ?

## 2021-07-20 NOTE — Telephone Encounter (Signed)
Notified patient as instructed, patient pleased. Discussed follow-up appointments, patient agrees  

## 2021-07-29 ENCOUNTER — Other Ambulatory Visit: Payer: Self-pay | Admitting: *Deleted

## 2021-07-29 DIAGNOSIS — R8281 Pyuria: Secondary | ICD-10-CM

## 2021-07-30 ENCOUNTER — Other Ambulatory Visit: Payer: Medicare PPO

## 2021-07-30 NOTE — Addendum Note (Signed)
Addended by: Alvera Novel on: 07/30/2021 03:56 PM ? ? Modules accepted: Orders ? ?

## 2021-08-03 ENCOUNTER — Other Ambulatory Visit: Payer: Medicare PPO

## 2021-08-03 DIAGNOSIS — R8281 Pyuria: Secondary | ICD-10-CM

## 2021-08-03 LAB — URINALYSIS, COMPLETE
Bilirubin, UA: NEGATIVE
Glucose, UA: NEGATIVE
Ketones, UA: NEGATIVE
Leukocytes,UA: NEGATIVE
Nitrite, UA: NEGATIVE
Protein,UA: NEGATIVE
RBC, UA: NEGATIVE
Specific Gravity, UA: 1.02 (ref 1.005–1.030)
Urobilinogen, Ur: 0.2 mg/dL (ref 0.2–1.0)
pH, UA: 6 (ref 5.0–7.5)

## 2021-08-03 LAB — MICROSCOPIC EXAMINATION
Bacteria, UA: NONE SEEN
RBC, Urine: NONE SEEN /hpf (ref 0–2)

## 2021-08-17 ENCOUNTER — Other Ambulatory Visit: Payer: Medicare PPO

## 2021-08-17 ENCOUNTER — Other Ambulatory Visit: Payer: Self-pay | Admitting: *Deleted

## 2021-08-17 MED ORDER — MIRABEGRON ER 25 MG PO TB24: 25.0000 mg | ORAL_TABLET | Freq: Every day | ORAL | 12 refills | Status: DC

## 2021-10-05 ENCOUNTER — Telehealth: Payer: Self-pay | Admitting: Family Medicine

## 2021-10-05 NOTE — Telephone Encounter (Signed)
Denton Ar called in from [facility]. She said that the son [name], said that patient was taken off of detrol due to detrimental to the client because of her dementia. Bubba Hales stated that there is a ROI on file and if it needs to e updated then she can provide one.

## 2021-10-05 NOTE — Telephone Encounter (Signed)
What is ROI?

## 2021-10-08 NOTE — Telephone Encounter (Signed)
Looks like urology is trying mybetriq instead  Please take detrol off her med list  Thanks

## 2021-10-08 NOTE — Telephone Encounter (Signed)
Checked medication has been removed from list already. No further action needed.

## 2021-10-21 ENCOUNTER — Encounter: Payer: Self-pay | Admitting: Orthopedic Surgery

## 2021-10-22 ENCOUNTER — Ambulatory Visit: Payer: Medicare PPO | Admitting: Orthopedic Surgery

## 2021-10-22 ENCOUNTER — Ambulatory Visit: Payer: Self-pay | Admitting: Orthopedic Surgery

## 2021-10-22 ENCOUNTER — Encounter: Payer: Self-pay | Admitting: Orthopedic Surgery

## 2021-10-22 VITALS — BP 118/74 | HR 78 | Temp 97.5°F | Resp 18 | Ht 68.0 in | Wt 107.0 lb

## 2021-10-22 DIAGNOSIS — H903 Sensorineural hearing loss, bilateral: Secondary | ICD-10-CM

## 2021-10-22 DIAGNOSIS — E038 Other specified hypothyroidism: Secondary | ICD-10-CM

## 2021-10-22 DIAGNOSIS — R4189 Other symptoms and signs involving cognitive functions and awareness: Secondary | ICD-10-CM | POA: Diagnosis not present

## 2021-10-22 DIAGNOSIS — R636 Underweight: Secondary | ICD-10-CM | POA: Diagnosis not present

## 2021-10-22 DIAGNOSIS — N3281 Overactive bladder: Secondary | ICD-10-CM | POA: Diagnosis not present

## 2021-10-22 DIAGNOSIS — E782 Mixed hyperlipidemia: Secondary | ICD-10-CM

## 2021-10-22 DIAGNOSIS — F339 Major depressive disorder, recurrent, unspecified: Secondary | ICD-10-CM

## 2021-10-22 DIAGNOSIS — N1831 Chronic kidney disease, stage 3a: Secondary | ICD-10-CM | POA: Diagnosis not present

## 2021-10-22 DIAGNOSIS — Z8542 Personal history of malignant neoplasm of other parts of uterus: Secondary | ICD-10-CM | POA: Diagnosis not present
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
## 2021-10-22 NOTE — Patient Instructions (Addendum)
Recommend taking Caltrate supplement daily and light walking for bone health   Add Ensure or Boost daily- we need to increase calories and add weight  Increase water intake

## 2021-11-19 ENCOUNTER — Encounter: Payer: Self-pay | Admitting: Orthopedic Surgery

## 2021-11-19 ENCOUNTER — Ambulatory Visit: Payer: Medicare PPO | Admitting: Orthopedic Surgery

## 2021-11-19 VITALS — BP 122/64 | HR 85 | Temp 96.8°F | Ht 68.0 in | Wt 113.2 lb

## 2021-11-19 DIAGNOSIS — E782 Mixed hyperlipidemia: Secondary | ICD-10-CM | POA: Diagnosis not present

## 2021-11-19 DIAGNOSIS — N3281 Overactive bladder: Secondary | ICD-10-CM | POA: Diagnosis not present

## 2021-11-19 DIAGNOSIS — F339 Major depressive disorder, recurrent, unspecified: Secondary | ICD-10-CM

## 2021-11-19 DIAGNOSIS — R636 Underweight: Secondary | ICD-10-CM

## 2021-11-19 DIAGNOSIS — N1832 Chronic kidney disease, stage 3b: Secondary | ICD-10-CM

## 2021-11-19 DIAGNOSIS — N1831 Chronic kidney disease, stage 3a: Secondary | ICD-10-CM

## 2021-11-19 DIAGNOSIS — H903 Sensorineural hearing loss, bilateral: Secondary | ICD-10-CM

## 2021-11-19 DIAGNOSIS — R4189 Other symptoms and signs involving cognitive functions and awareness: Secondary | ICD-10-CM

## 2021-11-19 DIAGNOSIS — E038 Other specified hypothyroidism: Secondary | ICD-10-CM

## 2021-11-19 NOTE — Patient Instructions (Addendum)
Think about getting Shingrix vaccine (shingles) vaccine at local pharmacy  2nd referral to neurology made- please schedule
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
## 2021-11-20 LAB — COMPLETE METABOLIC PANEL WITH GFR
AG Ratio: 1.7 (calc) (ref 1.0–2.5)
ALT: 11 U/L (ref 6–29)
AST: 20 U/L (ref 10–35)
Albumin: 3.8 g/dL (ref 3.6–5.1)
Alkaline phosphatase (APISO): 41 U/L (ref 37–153)
BUN/Creatinine Ratio: 26 (calc) — ABNORMAL HIGH (ref 6–22)
BUN: 38 mg/dL — ABNORMAL HIGH (ref 7–25)
CO2: 26 mmol/L (ref 20–32)
Calcium: 9 mg/dL (ref 8.6–10.4)
Chloride: 109 mmol/L (ref 98–110)
Creat: 1.48 mg/dL — ABNORMAL HIGH (ref 0.60–0.95)
Globulin: 2.3 g/dL (calc) (ref 1.9–3.7)
Glucose, Bld: 126 mg/dL (ref 65–139)
Potassium: 4.1 mmol/L (ref 3.5–5.3)
Sodium: 143 mmol/L (ref 135–146)
Total Bilirubin: 0.3 mg/dL (ref 0.2–1.2)
Total Protein: 6.1 g/dL (ref 6.1–8.1)
eGFR: 34 mL/min/{1.73_m2} — ABNORMAL LOW (ref >=60)

## 2021-11-20 LAB — CBC WITH DIFFERENTIAL/PLATELET
Absolute Monocytes: 613 cells/uL (ref 200–950)
Basophils Absolute: 73 cells/uL (ref 0–200)
Basophils Relative: 1 %
Eosinophils Absolute: 183 cells/uL (ref 15–500)
Eosinophils Relative: 2.5 %
HCT: 35 % (ref 35.0–45.0)
Hemoglobin: 11.4 g/dL — ABNORMAL LOW (ref 11.7–15.5)
Lymphs Abs: 1511 cells/uL (ref 850–3900)
MCH: 31.8 pg (ref 27.0–33.0)
MCHC: 32.6 g/dL (ref 32.0–36.0)
MCV: 97.8 fL (ref 80.0–100.0)
MPV: 12 fL (ref 7.5–12.5)
Monocytes Relative: 8.4 %
Neutro Abs: 4920 cells/uL (ref 1500–7800)
Neutrophils Relative %: 67.4 %
Platelets: 206 10*3/uL (ref 140–400)
RBC: 3.58 10*6/uL — ABNORMAL LOW (ref 3.80–5.10)
RDW: 12.8 % (ref 11.0–15.0)
Total Lymphocyte: 20.7 %
WBC: 7.3 10*3/uL (ref 3.8–10.8)

## 2021-11-20 LAB — TSH: TSH: 3.53 mIU/L (ref 0.40–4.50)

## 2021-11-23 ENCOUNTER — Ambulatory Visit: Payer: Medicare PPO | Admitting: Physician Assistant

## 2021-11-23 ENCOUNTER — Encounter: Payer: Self-pay | Admitting: Physician Assistant

## 2021-11-23 VITALS — BP 96/58 | HR 58 | Resp 18 | Ht 68.0 in | Wt 110.0 lb

## 2021-11-23 DIAGNOSIS — G309 Alzheimer's disease, unspecified: Secondary | ICD-10-CM

## 2021-11-23 DIAGNOSIS — F0280 Dementia in other diseases classified elsewhere without behavioral disturbance: Secondary | ICD-10-CM

## 2021-11-23 DIAGNOSIS — F0150 Vascular dementia without behavioral disturbance: Secondary | ICD-10-CM | POA: Diagnosis not present

## 2021-11-23 NOTE — Progress Notes (Cosign Needed Addendum)
Assessment/Plan:    The patient is seen in neurologic consultation at the request of Cleophas Dunker, [place], NP for the evaluation of memory.  Cindy Davies is a very pleasant 88 y.o. year old RH female with  a history of hypertension, hyperlipidemia, endometrial cancer s/p chemo XRT, chronic anemia, chronic leg paresthesia due to prior spinal cord injury in 1971,arthritis, CKD, severe hearing loss, OAB, subclinical hypothyroidism, situational depression, seen today for evaluation of memory loss. MRI brain December 2022 was reviewed, showing mild generalized age-related volume loss, without evidence of advanced atrophy or lobar predominance.  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 She is on donepezil 10 mg daily, tolerating well.  MoCA today is 14/30.  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 Continue donepezil 10 mg daily as per PCP Recommend hearing evaluation Consider baby aspirin Recommend good control of cardiovascular risk factors.  Monitor her low blood pressure as per PCP Continue B12 supplements  Folllow up  in 6 months   Subjective:    The patient is accompanied by  who supplements the history.    How long did patient have memory difficulties? For about 3 years, worse since Mar 13, 2023 after her husband death, STM < LTM.  Sometimes she forgets names and dates.  She is severely hard of hearing, so several times, "she will make her own answer because she cannot hear well ".  Her son reports that she was "never very bright ".  She does enjoy doing crossword puzzles. Patient lives with: Her son repeats oneself? Denies  Disoriented when walking into a room?  Patient denies   Leaving  objects in unusual places?  Denies  Ambulates  with difficulty?  Not as mobile as before, she is weaker. No balance issues.  She uses a  R cane to ambulate. Recent falls?  Patient denies   Any head injuries?  Patient denies   History of seizures?   Patient denies   Wandering behavior?  Patient denies   Patient drives?   Patient no longer drives   Any mood changes ? Residual depression from her husband dying last 03/13/23.  She did have grief counseling.  She is also on Paxil, her son is entertaining changing days, as he is concerned that it may be affecting her memory.  He is to discuss with PCP-he just changed PCPs to a geriatric one. Hallucinations?  Patient denies   Paranoia? Denies .  Patient reports that she sleeps an average of  10- 12 hours a day, without vivid dreams, REM behavior or sleepwalking   History of sleep apnea?  Patient denies   Any hygiene concerns?  Patient denies   Independent of bathing and dressing?  Endorsed  Does the patient needs help with medications? Son is in charge, puts them in a pill Environmental education officer. Who is in charge of the finances?  Patient is in charge     Any changes in appetite?   Denies. Decreased water intake, but likes coffee, "but I bought the large 64 ounces tumbler with a straw and that helps to drink more water " Patient have trouble swallowing? Patient denies   Does the patient cook?  Patient denies   Any kitchen accidents such as leaving the stove  on? Patient denies   Any headaches?  Patient denies   Double vision? Patient denies   Any focal numbness or tingling?  She has chronic leg paresthesia due to a neck injury in 1971.  No new symptoms of stroke. Chronic back pain Patient denies   Unilateral weakness?  R side is weaker after neck injury 1971.  Any tremors?  Patient denies   Any history of anosmia?  Patient denies   Any incontinence of urine?  History of urge incontinence, OAB controlled with Myrbetriq Any bowel dysfunction?   Patient denies    History of heavy alcohol intake?  Patient denies   History of heavy tobacco use?  Patient denies   Family history of dementia?  Patient denies   MRI brain  03/2021  W [facility] WO Mild generalized age related volume loss without evidence of advanced atrophy or discernible lobar predominance. Mild chronic small-vessel ischemic change of the cerebral hemispheric white matter, less than often seen at this age. Small old cortical infarctions in the anterolateral right temporal lobe and inferior left occipital lobe  Labs 11/19/2021 TSH 3.53, hemoglobin 11.4 otherwise unremarkable, B12 610 (1 06/30/2021) No Known Allergies  Current Outpatient Medications  Medication Instructions   acetaminophen (TYLENOL) 650 mg, Oral, Every 6 hours PRN   Cyanocobalamin (B-12 PO) 1 tablet, Oral, Daily   Cyanocobalamin (VITAMIN B 12 PO) 250 mg, Oral, Daily   donepezil (ARICEPT) 10 mg, Oral, Daily at bedtime   mirabegron ER (MYRBETRIQ) 25 mg, Oral, Daily   Multiple Vitamins-Minerals (CENTRUM SILVER ADULT 50+ PO) 1 tablet, Oral, Daily   PARoxetine (PAXIL) 20 mg, Oral, Daily   polyethylene glycol (MIRALAX / GLYCOLAX) 17 g, Oral, As needed   simvastatin (ZOCOR) 40 MG tablet TAKE ONE TABLET BY MOUTH EVERY DAY AT BEDTIME     VITALS:   Vitals:   11/23/21 0816  BP: (!) 96/58  Pulse: (!) 58  Resp: 18  SpO2: 96%  Weight: 110 lb (49.9 kg)  Height: '5\' 8"'$  (1.727 m)    PHYSICAL EXAM   HEENT:  Normocephalic, atraumatic. The mucous membranes are moist. The superficial temporal arteries are without ropiness or tenderness. Cardiovascular: Regular rate and rhythm. Lungs: Clear to auscultation bilaterally. Neck: There are no carotid bruits noted bilaterally.  NEUROLOGICAL:     No data to display             12/20/2017    8:56 AM 05/06/2016   10:24 AM  MMSE - Mini Mental State Exam  Orientation to time 5 5  Orientation to Place 5 5  Registration 3 3  Attention/ Calculation 0 0  Recall 3 3  Language- name 2  objects 0 0  Language- repeat 1 1  Language- follow 3 step command 3 3  Language- read & follow direction 0 0  Write a sentence 0 0  Copy design 0 0  Total score 20 20     Orientation:  Alert and oriented to person, place not to time, the year is 2025.  No aphasia or dysarthria.  Extremely hard of hearing.  Fund of knowledge is appropriate. Recent memory impaired and remote memory intact.  Attention and concentration are normal.  Able to name objects to/3 and repeat phrases 1/2. Delayed recall 0/5. Cranial nerves: There is good facial symmetry. Extraocular muscles are intact and visual fields are full to confrontational testing. Speech is fluent and clear. Soft palate rises symmetrically and there is no tongue deviation. Hearing is severely reduced to conversational tone  despite the use of hearing aid provided with our device. Tone: Tone is good throughout. Sensation: Intact to light touch .She has known chronic paresthesias in both lower extremities.  Coordination: The patient has no difficulty with RAM's or FNF bilaterally. Normal finger to nose  Motor: Strength is 5/5 in the bilateral upper and 4/5 lower extremities. There is no pronator drift. There are no fasciculations noted. DTR's: Deep tendon reflexes are 1/4 at the bilateral biceps, triceps, brachioradialis, patella and achilles. Gait and Station:  The patient is able to ambulate and to heel toe walk but with the help of a right cane. The patient is unable to ambulate in a tandem fashion. The patient is able to stand in the Romberg position with cane.     Thank you for allowing [place] the opportunity to participate in the care of this nice patient. Please do not hesitate to contact us for any questions or concerns.   Total time spent on today's visit was 70 minutes dedicated to this patient today, preparing to see patient, examining the patient, ordering tests and/or medications and counseling the patient, documenting clinical information  in the EHR or other health record, independently interpreting results and communicating results to the patient/family, discussing treatment and goals, answering patient's questions and coordinating care.  Cc:  Yvonna Alanis, NP  Sharene Butters 11/23/2021 9:28 AM

## 2021-11-23 NOTE — Patient Instructions (Addendum)
It was a pleasure to see you today at our office.   Recommendations:  Continue to control mood as per PCP. Continue donepezil 10 mg daily as per PCP Recommend hearing evaluation  Consider baby aspirin when ok with primary doctor  Continue B12 supplements  Increase ambulation  Folllow up  in 6 months   Whom to call:  Memory  decline, memory medications: Call our office [phone]   For psychiatric meds, mood meds: Please have your primary care physician manage these medications.   Counseling regarding caregiver distress, including caregiver depression, anxiety and issues regarding community resources, adult day care programs, adult living facilities, or memory care questions:   Feel free to contact [place], Social Worker at [phone]   For assessment of decision of mental capacity and competency:  Call Dr. Anthoney Harada, geriatric psychiatrist at [phone]  For guidance in geriatric dementia issues please call [facility] [phone]  For guidance regarding [facility] and if placement were needed at the facility, contact Arnell Asal, Social Worker tel: [phone]  If you have any severe symptoms of a stroke, or other severe issues such as confusion,severe chills or fever, etc call 911 or go to the ER as you may need to be evaluated further   Feel free to visit Facebook page " [employer]" for tips of how to care for people with memory problems.   Feel free to go to the following database for funded clinical studies conducted around the world: http://saunders.com/   https://www.triadclinicaltrials.com/     RECOMMENDATIONS FOR ALL PATIENTS WITH MEMORY PROBLEMS: 1. Continue to exercise (Recommend 30 minutes of walking everyday, or 3 hours every week) 2. Increase social interactions - continue going to [place] and enjoy social gatherings with friends and family 3. Eat healthy, avoid fried foods and eat more fruits and  vegetables 4. Maintain adequate blood pressure, blood sugar, and blood cholesterol level. Reducing the risk of stroke and cardiovascular disease also helps promoting better memory. 5. Avoid stressful situations. Live a simple life and avoid aggravations. Organize your time and prepare for the next day in anticipation. 6. Sleep well, avoid any interruptions of sleep and avoid any distractions in the bedroom that may interfere with adequate sleep quality 7. Avoid sugar, avoid sweets as there is a strong link between excessive sugar intake, diabetes, and cognitive impairment We discussed the Mediterranean diet, which has been shown to help patients reduce the risk of progressive memory disorders and reduces cardiovascular risk. This includes eating fish, eat fruits and green leafy vegetables, nuts like almonds and hazelnuts, walnuts, and also use olive oil. Avoid fast foods and fried foods as much as possible. Avoid sweets and sugar as sugar use has been linked to worsening of memory function.  There is always a concern of gradual progression of memory problems. If this is the case, then we may need to adjust level of care according to patient needs. Support, both to the patient and caregiver, should then be put into place.      You have been referred for a neuropsychological evaluation (i.e., evaluation of memory and thinking abilities). Please bring someone with you to this appointment if possible, as it is helpful for the doctor to hear from both you and another adult who knows you well. Please bring eyeglasses and hearing aids if you wear them.    The evaluation will take approximately 3 hours and has two parts:   The first part is a clinical interview with  the neuropsychologist (Dr. Melvyn Novas or Dr. Nicole Kindred). During the interview, the neuropsychologist will speak with you and the individual you brought to the appointment.    The second part of the evaluation is testing with the doctor's technician  Hinton Dyer or Maudie Mercury). During the testing, the technician will ask you to remember different types of material, solve problems, and answer some questionnaires. Your family member will not be present for this portion of the evaluation.   Please note: We must reserve several hours of the neuropsychologist's time and the psychometrician's time for your evaluation appointment. As such, there is a No-Show fee of $100. If you are unable to attend any of your appointments, please contact our office as soon as possible to reschedule.    FALL PRECAUTIONS: Be cautious when walking. Scan the area for obstacles that may increase the risk of trips and falls. When getting up in the mornings, sit up at the edge of the bed for a few minutes before getting out of bed. Consider elevating the bed at the head end to avoid drop of blood pressure when getting up. Walk always in a well-lit room (use night lights in the walls). Avoid area rugs or power cords from appliances in the middle of the walkways. Use a walker or a cane if necessary and consider physical therapy for balance exercise. Get your eyesight checked regularly.  FINANCIAL OVERSIGHT: Supervision, especially oversight when making financial decisions or transactions is also recommended.  HOME SAFETY: Consider the safety of the kitchen when operating appliances like stoves, microwave oven, and blender. Consider having supervision and share cooking responsibilities until no longer able to participate in those. Accidents with firearms and other hazards in the house should be identified and addressed as well.   ABILITY TO BE LEFT ALONE: If patient is unable to contact 911 operator, consider using LifeLine, or when the need is there, arrange for someone to stay with patients. Smoking is a fire hazard, consider supervision or cessation. Risk of wandering should be assessed by caregiver and if detected at any point, supervision and safe proof recommendations should be  instituted.  MEDICATION SUPERVISION: Inability to self-administer medication needs to be constantly addressed. Implement a mechanism to ensure safe administration of the medications.   DRIVING: Regarding driving, in patients with progressive memory problems, driving will be impaired. We advise to have someone else do the driving if trouble finding directions or if minor accidents are reported. Independent driving assessment is available to determine safety of driving.   If you are interested in the driving assessment, you can contact the following:  [employer] in [place]  [place] [phone] or [phone]    [place] refers to food and lifestyle choices that are based on the traditions of countries located on the [employer]. This way of eating has been shown to help prevent certain conditions and improve outcomes for people who have chronic diseases, like kidney disease and heart disease. What are tips for following this plan? Lifestyle  Cook and eat meals together with your family, when possible. Drink enough fluid to keep your urine clear or pale yellow. Be physically active every day. This includes: Aerobic exercise like running or swimming. Leisure activities like gardening, walking, or housework. Get 7-8 hours of sleep each night. If recommended by your health care provider, drink red wine in moderation. This means 1 glass a day for nonpregnant women and  2 glasses a day for men. A glass of wine equals 5 oz (150 mL). Reading food labels  Check the serving size of packaged foods. For foods such as rice and pasta, the serving size refers to the amount of cooked product, not dry. Check the total fat in packaged foods. Avoid foods that have saturated fat or trans fats. Check the ingredients list for added sugars, such as  corn syrup. Shopping  At the grocery store, buy most of your food from the areas near the walls of the store. This includes: Fresh fruits and vegetables (produce). Grains, beans, nuts, and seeds. Some of these may be available in unpackaged forms or large amounts (in bulk). Fresh seafood. Poultry and eggs. Low-fat dairy products. Buy whole ingredients instead of prepackaged foods. Buy fresh fruits and vegetables in-season from local farmers markets. Buy frozen fruits and vegetables in resealable bags. If you do not have access to quality fresh seafood, buy precooked frozen shrimp or canned fish, such as tuna, [place], or sardines. Buy small amounts of raw or cooked vegetables, salads, or olives from the deli or salad bar at your store. Stock your pantry so you always have certain foods on hand, such as olive oil, canned tuna, canned tomatoes, rice, pasta, and beans. Cooking  Cook foods with extra-virgin olive oil instead of using butter or other vegetable oils. Have meat as a side dish, and have vegetables or grains as your main dish. This means having meat in small portions or adding small amounts of meat to foods like pasta or stew. Use beans or vegetables instead of meat in common dishes like chili or lasagna. Experiment with different cooking methods. Try roasting or broiling vegetables instead of steaming or sauteing them. Add frozen vegetables to soups, stews, pasta, or rice. Add nuts or seeds for added healthy fat at each meal. You can add these to yogurt, salads, or vegetable dishes. Marinate fish or vegetables using olive oil, lemon juice, garlic, and fresh herbs. Meal planning  Plan to eat 1 vegetarian meal one day each week. Try to work up to 2 vegetarian meals, if possible. Eat seafood 2 or more times a week. Have healthy snacks readily available, such as: Vegetable sticks with hummus. Greek yogurt. Fruit and nut trail mix. Eat balanced meals throughout the week. This  includes: Fruit: 2-3 servings a day Vegetables: 4-5 servings a day Low-fat dairy: 2 servings a day Fish, poultry, or lean meat: 1 serving a day Beans and legumes: 2 or more servings a week Nuts and seeds: 1-2 servings a day Whole grains: 6-8 servings a day Extra-virgin olive oil: 3-4 servings a day Limit red meat and sweets to only a few servings a month What are my food choices? Mediterranean diet Recommended Grains: Whole-grain pasta. Brown rice. Bulgar wheat. Polenta. Couscous. Whole-wheat bread. [place]. Vegetables: Artichokes. Beets. Broccoli. Cabbage. Carrots. Eggplant. Green beans. Chard. Kale. Spinach. Onions. Leeks. Peas. Squash. Tomatoes. Peppers. Radishes. Fruits: Apples. Apricots. Avocado. Berries. Bananas. Cherries. Dates. Figs. Grapes. Lemons. Melon. Oranges. Peaches. Plums. Pomegranate. Meats and other protein foods: Beans. Almonds. Sunflower seeds. Pine nuts. Peanuts. [place]. [place]. Scallops. Shrimp. [place]. Tilapia. Clams. Oysters. Eggs. Dairy: Low-fat milk. Cheese. Greek yogurt. Beverages: Water. Red wine. Herbal tea. Fats and oils: Extra virgin olive oil. Avocado oil. Grape seed oil. Sweets and desserts: [place] yogurt with honey. Baked apples. Poached pears. Trail mix. Seasoning and other foods: Basil. Cilantro. Coriander. Cumin. Mint. Parsley. Sage. Rosemary. Tarragon. Garlic. Oregano. Thyme. Pepper. Balsalmic vinegar. Tahini. Hummus.  Tomato sauce. Olives. Mushrooms. Limit these Grains: Prepackaged pasta or rice dishes. Prepackaged cereal with added sugar. Vegetables: Deep fried potatoes (french fries). Fruits: Fruit canned in syrup. Meats and other protein foods: Beef. Pork. Lamb. Poultry with skin. Hot dogs. Bernie[place] Salines. Dairy: Ice cream. Sour cream. Whole milk. Beverages: Juice. Sugar-sweetened soft drinks. Beer. Liquor and spirits. Fats and oils: Butter. Canola oil. Vegetable oil. Beef fat (tallow). Lard. Sweets and desserts: Cookies. Cakes. Pies. Candy. Seasoning  and other foods: Mayonnaise. Premade sauces and marinades. The items listed may not be a complete list. Talk with your dietitian about what dietary choices are right for you. Summary The Mediterranean diet includes both food and lifestyle choices. Eat a variety of fresh fruits and vegetables, beans, nuts, seeds, and whole grains. Limit the amount of red meat and sweets that you eat. Talk with your health care provider about whether it is safe for you to drink red wine in moderation. This means 1 glass a day for nonpregnant women and 2 glasses a day for men. A glass of wine equals 5 oz (150 mL). This information is not intended to replace advice given to you by your health care provider. Make sure you discuss any questions you have with your health care provider. Document Released: 12/04/2015 Document Revised: 01/06/2016 Document Reviewed: 12/04/2015 Elsevier Interactive Patient Education  2017 [employer].

## 2021-12-24 DIAGNOSIS — F419 Anxiety disorder, unspecified: Secondary | ICD-10-CM | POA: Diagnosis not present

## 2021-12-24 DIAGNOSIS — N3941 Urge incontinence: Secondary | ICD-10-CM | POA: Diagnosis not present

## 2021-12-24 DIAGNOSIS — I739 Peripheral vascular disease, unspecified: Secondary | ICD-10-CM | POA: Diagnosis not present

## 2021-12-24 DIAGNOSIS — F3341 Major depressive disorder, recurrent, in partial remission: Secondary | ICD-10-CM | POA: Diagnosis not present

## 2021-12-24 DIAGNOSIS — E785 Hyperlipidemia, unspecified: Secondary | ICD-10-CM | POA: Diagnosis not present

## 2021-12-24 DIAGNOSIS — Z604 Social exclusion and rejection: Secondary | ICD-10-CM | POA: Diagnosis not present

## 2021-12-24 DIAGNOSIS — F01518 Vascular dementia, unspecified severity, with other behavioral disturbance: Secondary | ICD-10-CM | POA: Diagnosis not present

## 2021-12-24 DIAGNOSIS — N3281 Overactive bladder: Secondary | ICD-10-CM | POA: Diagnosis not present

## 2021-12-24 DIAGNOSIS — R636 Underweight: Secondary | ICD-10-CM | POA: Diagnosis not present

## 2022-01-25 ENCOUNTER — Other Ambulatory Visit: Payer: Self-pay | Admitting: Family Medicine

## 2022-02-08 ENCOUNTER — Other Ambulatory Visit: Payer: Self-pay | Admitting: Family Medicine

## 2022-03-03 ENCOUNTER — Other Ambulatory Visit: Payer: Self-pay | Admitting: Family Medicine

## 2022-03-25 ENCOUNTER — Ambulatory Visit: Payer: Medicare PPO | Admitting: Orthopedic Surgery

## 2022-03-25 ENCOUNTER — Encounter: Payer: Self-pay | Admitting: Orthopedic Surgery

## 2022-03-25 VITALS — BP 121/80 | HR 86 | Temp 98.1°F | Resp 18 | Ht 68.0 in | Wt 110.0 lb

## 2022-03-25 DIAGNOSIS — E782 Mixed hyperlipidemia: Secondary | ICD-10-CM | POA: Diagnosis not present

## 2022-03-25 DIAGNOSIS — E038 Other specified hypothyroidism: Secondary | ICD-10-CM

## 2022-03-25 DIAGNOSIS — R4189 Other symptoms and signs involving cognitive functions and awareness: Secondary | ICD-10-CM | POA: Diagnosis not present

## 2022-03-25 DIAGNOSIS — F339 Major depressive disorder, recurrent, unspecified: Secondary | ICD-10-CM

## 2022-03-25 DIAGNOSIS — H903 Sensorineural hearing loss, bilateral: Secondary | ICD-10-CM

## 2022-03-25 DIAGNOSIS — N1832 Chronic kidney disease, stage 3b: Secondary | ICD-10-CM | POA: Diagnosis not present

## 2022-03-25 MED ORDER — ASPIRIN 81 MG PO TBEC: 81.0000 mg | DELAYED_RELEASE_TABLET | Freq: Every day | ORAL | 12 refills | Status: AC
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
## 2022-03-25 NOTE — Patient Instructions (Addendum)
Please start taking baby aspirin ('81mg'$ ) daily with food for brain health  Please come fasting next appointment, schedule early visit

## 2022-03-26 LAB — CBC WITH DIFFERENTIAL/PLATELET
Absolute Monocytes: 638 cells/uL (ref 200–950)
Basophils Absolute: 53 cells/uL (ref 0–200)
Basophils Relative: 0.7 %
Eosinophils Absolute: 129 cells/uL (ref 15–500)
Eosinophils Relative: 1.7 %
HCT: 37.7 % (ref 35.0–45.0)
Hemoglobin: 12.7 g/dL (ref 11.7–15.5)
Lymphs Abs: 1360 cells/uL (ref 850–3900)
MCH: 31.9 pg (ref 27.0–33.0)
MCHC: 33.7 g/dL (ref 32.0–36.0)
MCV: 94.7 fL (ref 80.0–100.0)
MPV: 12.2 fL (ref 7.5–12.5)
Monocytes Relative: 8.4 %
Neutro Abs: 5419 cells/uL (ref 1500–7800)
Neutrophils Relative %: 71.3 %
Platelets: 240 10*3/uL (ref 140–400)
RBC: 3.98 10*6/uL (ref 3.80–5.10)
RDW: 12.4 % (ref 11.0–15.0)
Total Lymphocyte: 17.9 %
WBC: 7.6 10*3/uL (ref 3.8–10.8)

## 2022-03-26 LAB — COMPLETE METABOLIC PANEL WITH GFR
AG Ratio: 1.4 (calc) (ref 1.0–2.5)
ALT: 14 U/L (ref 6–29)
AST: 22 U/L (ref 10–35)
Albumin: 4.2 g/dL (ref 3.6–5.1)
Alkaline phosphatase (APISO): 46 U/L (ref 37–153)
BUN/Creatinine Ratio: 31 (calc) — ABNORMAL HIGH (ref 6–22)
BUN: 33 mg/dL — ABNORMAL HIGH (ref 7–25)
CO2: 27 mmol/L (ref 20–32)
Calcium: 9.5 mg/dL (ref 8.6–10.4)
Chloride: 104 mmol/L (ref 98–110)
Creat: 1.06 mg/dL — ABNORMAL HIGH (ref 0.60–0.95)
Globulin: 2.9 g/dL (calc) (ref 1.9–3.7)
Glucose, Bld: 85 mg/dL (ref 65–139)
Potassium: 4.7 mmol/L (ref 3.5–5.3)
Sodium: 142 mmol/L (ref 135–146)
Total Bilirubin: 0.4 mg/dL (ref 0.2–1.2)
Total Protein: 7.1 g/dL (ref 6.1–8.1)
eGFR: 51 mL/min/{1.73_m2} — ABNORMAL LOW (ref >=60)

## 2022-03-26 LAB — TSH: TSH: 5.48 mIU/L — ABNORMAL HIGH (ref 0.40–4.50)

## 2022-04-01 ENCOUNTER — Ambulatory Visit: Payer: Medicare PPO | Attending: Audiologist | Admitting: Audiologist

## 2022-04-01 DIAGNOSIS — H9193 Unspecified hearing loss, bilateral: Secondary | ICD-10-CM | POA: Insufficient documentation
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
## 2022-04-15 ENCOUNTER — Encounter: Payer: Self-pay | Admitting: Orthopedic Surgery

## 2022-04-22 ENCOUNTER — Other Ambulatory Visit: Payer: Self-pay | Admitting: Family Medicine

## 2022-04-22 ENCOUNTER — Telehealth: Payer: Medicare PPO | Admitting: *Deleted

## 2022-04-22 NOTE — Telephone Encounter (Signed)
Son dropped off handicap Placard to be filled out and signed by Amy.  Form filled out and placed in Amy's folder to review and sign.  Call patient son once completed to pick up.

## 2022-04-23 ENCOUNTER — Other Ambulatory Visit: Payer: Self-pay | Admitting: Orthopedic Surgery

## 2022-04-29 NOTE — Telephone Encounter (Signed)
Form signed.

## 2022-04-29 NOTE — Telephone Encounter (Signed)
Son Notified [phone] and will pick up. Left up front in drawer.  Copy sent to [place]

## 2022-05-26 ENCOUNTER — Encounter: Payer: Self-pay | Admitting: Physician Assistant

## 2022-05-26 ENCOUNTER — Ambulatory Visit: Payer: Medicare PPO | Admitting: Physician Assistant

## 2022-05-26 VITALS — BP 154/86 | HR 84 | Resp 18 | Ht 68.0 in | Wt 110.0 lb

## 2022-05-26 DIAGNOSIS — H9193 Unspecified hearing loss, bilateral: Secondary | ICD-10-CM | POA: Diagnosis not present

## 2022-05-26 DIAGNOSIS — R413 Other amnesia: Secondary | ICD-10-CM

## 2022-05-26 NOTE — Patient Instructions (Signed)
It was a pleasure to see you today at our office.   Recommendations:  Continue to control mood as per PCP. Continue donepezil 10 mg daily as per PCP Continue hearing evaluation  Consider baby aspirin when ok with primary doctor  Continue B12 supplements  Increase ambulation  Folllow up  in 6 months   Whom to call:  Memory  decline, memory medications: Call our office [phone]   For psychiatric meds, mood meds: Please have your primary care physician manage these medications.    For assessment of decision of mental capacity and competency:  Call Dr. Anthoney Harada, geriatric psychiatrist at [phone]  For guidance in geriatric dementia issues please call [facility] [phone]  For guidance regarding [facility] and if placement were needed at the facility, contact Arnell Asal, Social Worker tel: [phone]  If you have any severe symptoms of a stroke, or other severe issues such as confusion,severe chills or fever, etc call 911 or go to the ER as you may need to be evaluated further   Feel free to visit Facebook page " [employer]" for tips of how to care for people with memory problems.   Feel free to go to the following database for funded clinical studies conducted around the world: http://saunders.com/   https://www.triadclinicaltrials.com/     RECOMMENDATIONS FOR ALL PATIENTS WITH MEMORY PROBLEMS: 1. Continue to exercise (Recommend 30 minutes of walking everyday, or 3 hours every week) 2. Increase social interactions - continue going to [place] and enjoy social gatherings with friends and family 3. Eat healthy, avoid fried foods and eat more fruits and vegetables 4. Maintain adequate blood pressure, blood sugar, and blood cholesterol level. Reducing the risk of stroke and cardiovascular disease also helps promoting better memory. 5. Avoid stressful situations. Live a simple life and avoid aggravations. Organize your time and  prepare for the next day in anticipation. 6. Sleep well, avoid any interruptions of sleep and avoid any distractions in the bedroom that may interfere with adequate sleep quality 7. Avoid sugar, avoid sweets as there is a strong link between excessive sugar intake, diabetes, and cognitive impairment We discussed the Mediterranean diet, which has been shown to help patients reduce the risk of progressive memory disorders and reduces cardiovascular risk. This includes eating fish, eat fruits and green leafy vegetables, nuts like almonds and hazelnuts, walnuts, and also use olive oil. Avoid fast foods and fried foods as much as possible. Avoid sweets and sugar as sugar use has been linked to worsening of memory function.  There is always a concern of gradual progression of memory problems. If this is the case, then we may need to adjust level of care according to patient needs. Support, both to the patient and caregiver, should then be put into place.      You have been referred for a neuropsychological evaluation (i.e., evaluation of memory and thinking abilities). Please bring someone with you to this appointment if possible, as it is helpful for the doctor to hear from both you and another adult who knows you well. Please bring eyeglasses and hearing aids if you wear them.    The evaluation will take approximately 3 hours and has two parts:   The first part is a clinical interview with the neuropsychologist (Dr. Melvyn Novas or Dr. Nicole Kindred). During the interview, the neuropsychologist will speak with you and the individual you brought to the appointment.    The second part of the evaluation is testing with the doctor's  technician Hinton Dyer or Maudie Mercury). During the testing, the technician will ask you to remember different types of material, solve problems, and answer some questionnaires. Your family member will not be present for this portion of the evaluation.   Please note: We must reserve several hours of the  neuropsychologist's time and the psychometrician's time for your evaluation appointment. As such, there is a No-Show fee of $100. If you are unable to attend any of your appointments, please contact our office as soon as possible to reschedule.    FALL PRECAUTIONS: Be cautious when walking. Scan the area for obstacles that may increase the risk of trips and falls. When getting up in the mornings, sit up at the edge of the bed for a few minutes before getting out of bed. Consider elevating the bed at the head end to avoid drop of blood pressure when getting up. Walk always in a well-lit room (use night lights in the walls). Avoid area rugs or power cords from appliances in the middle of the walkways. Use a walker or a cane if necessary and consider physical therapy for balance exercise. Get your eyesight checked regularly.  FINANCIAL OVERSIGHT: Supervision, especially oversight when making financial decisions or transactions is also recommended.  HOME SAFETY: Consider the safety of the kitchen when operating appliances like stoves, microwave oven, and blender. Consider having supervision and share cooking responsibilities until no longer able to participate in those. Accidents with firearms and other hazards in the house should be identified and addressed as well.   ABILITY TO BE LEFT ALONE: If patient is unable to contact 911 operator, consider using LifeLine, or when the need is there, arrange for someone to stay with patients. Smoking is a fire hazard, consider supervision or cessation. Risk of wandering should be assessed by caregiver and if detected at any point, supervision and safe proof recommendations should be instituted.  MEDICATION SUPERVISION: Inability to self-administer medication needs to be constantly addressed. Implement a mechanism to ensure safe administration of the medications.   DRIVING: Regarding driving, in patients with progressive memory problems, driving will be impaired. We  advise to have someone else do the driving if trouble finding directions or if minor accidents are reported. Independent driving assessment is available to determine safety of driving.   If you are interested in the driving assessment, you can contact the following:  [employer] in [place]  [place] [phone] or [phone]    [place] refers to food and lifestyle choices that are based on the traditions of countries located on the [employer]. This way of eating has been shown to help prevent certain conditions and improve outcomes for people who have chronic diseases, like kidney disease and heart disease. What are tips for following this plan? Lifestyle  Cook and eat meals together with your family, when possible. Drink enough fluid to keep your urine clear or pale yellow. Be physically active every day. This includes: Aerobic exercise like running or swimming. Leisure activities like gardening, walking, or housework. Get 7-8 hours of sleep each night. If recommended by your health care provider, drink red wine in moderation. This means 1 glass a day for nonpregnant women and 2 glasses a day for men. A glass of wine equals 5 oz (150 mL). Reading food labels  Check the serving size of packaged foods. For foods such as rice and pasta, the serving size refers to  the amount of cooked product, not dry. Check the total fat in packaged foods. Avoid foods that have saturated fat or trans fats. Check the ingredients list for added sugars, such as corn syrup. Shopping  At the grocery store, buy most of your food from the areas near the walls of the store. This includes: Fresh fruits and vegetables (produce). Grains, beans, nuts, and seeds. Some of these may be available in unpackaged forms or large amounts (in bulk). Fresh  seafood. Poultry and eggs. Low-fat dairy products. Buy whole ingredients instead of prepackaged foods. Buy fresh fruits and vegetables in-season from local farmers markets. Buy frozen fruits and vegetables in resealable bags. If you do not have access to quality fresh seafood, buy precooked frozen shrimp or canned fish, such as tuna, [place], or sardines. Buy small amounts of raw or cooked vegetables, salads, or olives from the deli or salad bar at your store. Stock your pantry so you always have certain foods on hand, such as olive oil, canned tuna, canned tomatoes, rice, pasta, and beans. Cooking  Cook foods with extra-virgin olive oil instead of using butter or other vegetable oils. Have meat as a side dish, and have vegetables or grains as your main dish. This means having meat in small portions or adding small amounts of meat to foods like pasta or stew. Use beans or vegetables instead of meat in common dishes like chili or lasagna. Experiment with different cooking methods. Try roasting or broiling vegetables instead of steaming or sauteing them. Add frozen vegetables to soups, stews, pasta, or rice. Add nuts or seeds for added healthy fat at each meal. You can add these to yogurt, salads, or vegetable dishes. Marinate fish or vegetables using olive oil, lemon juice, garlic, and fresh herbs. Meal planning  Plan to eat 1 vegetarian meal one day each week. Try to work up to 2 vegetarian meals, if possible. Eat seafood 2 or more times a week. Have healthy snacks readily available, such as: Vegetable sticks with hummus. Greek yogurt. Fruit and nut trail mix. Eat balanced meals throughout the week. This includes: Fruit: 2-3 servings a day Vegetables: 4-5 servings a day Low-fat dairy: 2 servings a day Fish, poultry, or lean meat: 1 serving a day Beans and legumes: 2 or more servings a week Nuts and seeds: 1-2 servings a day Whole grains: 6-8 servings a day Extra-virgin olive oil: 3-4  servings a day Limit red meat and sweets to only a few servings a month What are my food choices? Mediterranean diet Recommended Grains: Whole-grain pasta. Brown rice. Bulgar wheat. Polenta. Couscous. Whole-wheat bread. [place]. Vegetables: Artichokes. Beets. Broccoli. Cabbage. Carrots. Eggplant. Green beans. Chard. Kale. Spinach. Onions. Leeks. Peas. Squash. Tomatoes. Peppers. Radishes. Fruits: Apples. Apricots. Avocado. Berries. Bananas. Cherries. Dates. Figs. Grapes. Lemons. Melon. Oranges. Peaches. Plums. Pomegranate. Meats and other protein foods: Beans. Almonds. Sunflower seeds. Pine nuts. Peanuts. [place]. [place]. Scallops. Shrimp. [place]. Tilapia. Clams. Oysters. Eggs. Dairy: Low-fat milk. Cheese. Greek yogurt. Beverages: Water. Red wine. Herbal tea. Fats and oils: Extra virgin olive oil. Avocado oil. Grape seed oil. Sweets and desserts: [place] yogurt with honey. Baked apples. Poached pears. Trail mix. Seasoning and other foods: Basil. Cilantro. Coriander. Cumin. Mint. Parsley. Sage. Rosemary. Tarragon. Garlic. Oregano. Thyme. Pepper. Balsalmic vinegar. Tahini. Hummus. Tomato sauce. Olives. Mushrooms. Limit these Grains: Prepackaged pasta or rice dishes. Prepackaged cereal with added sugar. Vegetables: Deep fried potatoes (french fries). Fruits: Fruit canned in syrup. Meats and other protein foods: Beef. Pork. Lamb. Poultry with  skin. Hot dogs. Berniece Salines. Dairy: Ice cream. Sour cream. Whole milk. Beverages: Juice. Sugar-sweetened soft drinks. Beer. Liquor and spirits. Fats and oils: Butter. Canola oil. Vegetable oil. Beef fat (tallow). Lard. Sweets and desserts: Cookies. Cakes. Pies. Candy. Seasoning and other foods: Mayonnaise. Premade sauces and marinades. The items listed may not be a complete list. Talk with your dietitian about what dietary choices are right for you. Summary The Mediterranean diet includes both food and lifestyle choices. Eat a variety of fresh fruits and  vegetables, beans, nuts, seeds, and whole grains. Limit the amount of red meat and sweets that you eat. Talk with your health care provider about whether it is safe for you to drink red wine in moderation. This means 1 glass a day for nonpregnant women and 2 glasses a day for men. A glass of wine equals 5 oz (150 mL). This information is not intended to replace advice given to you by your health care provider. Make sure you discuss any questions you have with your health care provider. Document Released: 12/04/2015 Document Revised: 01/06/2016 Document Reviewed: 12/04/2015 Elsevier Interactive Patient Education  2017 [employer].

## 2022-05-26 NOTE — Progress Notes (Signed)
Assessment/Plan:   Memory Impairment  Cindy Davies is a very pleasant 89 y.o. RH femalewith  a history of hypertension, hyperlipidemia, endometrial cancer s/p chemo XRT, chronic anemia, chronic leg paresthesia due to prior spinal cord injury in 1971,arthritis, CKD, severe hearing loss, OAB, subclinical hypothyroidism, situational depression, seen today for evaluation of memory loss. MRI brain December 2022 was reviewed, showing mild generalized age-related volume loss, without evidence of advanced atrophy or lobar predominance.  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 She is on donepezil 10 mg daily, tolerating well. MMSE today is 21/30, stable from  the cognitive standpoint. She is to be tested for possible cochlear implant which could possible aid to improve her memory issues    Follow up in  6 months. Continue donepezil 10 mg daily. Side effects were discussed  Agree with audiology evaluation  Increase ambulation  Continue B12 supplements  Continue to control mood as per PCP Recommend good control of cardiovascular risk factors.       Subjective:    This patient is accompanied in the office by her son Cindy Davies who supplements the history.  Previous records as well as any outside records available were reviewed prior to todays visit. Patient was last seen on 11/23/21   Any changes in memory since last visit?  He son reports that he memory may be "down a little bit, especially with ADLs.  Sometimes she may have  difficulty remembering recent conversations but she is ok with  people names". She likes word search., watching TV. LTM is good repeats oneself?  Endorsed.  "She always did that"  Disoriented when walking into a room?  Patient denies   Leaving objects in unusual places?    Denies, but she may leave food on the counter and walk away  Wandering  behavior?  denies   Any personality changes since last visit?  denies   Any worsening depression?:  denies   Hallucinations or paranoia?  denies   Seizures?    denies    Any sleep changes? "A lot, but the same as a few months ago" Denies vivid dreams, REM behavior or sleepwalking   Sleep apnea?   denies   Any hygiene concerns?  Endorsed, she does not want to shower as often as before.  Independent of bathing and dressing?  Endorsed  Does the patient needs help with medications? Son sets in th a pillbox weekly.    Who is in charge of the finances? Son Cindy Davies  is in charge    Any changes in appetite?  Denies, craves sweets, "but always did"    Patient have trouble swallowing?  denies   Does the patient cook? Yes  Any kitchen accidents such as leaving the stove on? A couple of times she left the stove on Any headaches?   denies   Chronic back pain  denies   Ambulates with difficulty?  R cane.Chronic difficulty walking since 1971    Recent falls or head injuries? denies     Unilateral weakness, numbness or tingling? denies   Any tremors?  denies   Any anosmia?  Patient denies   Any incontinence of urine? Endorsed, she wears diapers Any bowel dysfunction? Not recently  Patient lives with Cindy Davies  Does the patient drive?She no longer drives     Initial Visit 10/2021 How long did patient have memory difficulties? For about 3 years, worse  since November after her husband death, STM < LTM.  Sometimes she forgets names and dates.  She is severely hard of hearing, so several times, "she will make her own answer because she cannot hear well ".  Her son reports that she was "never very bright ".  She does enjoy doing crossword puzzles. Patient lives with: Her son repeats oneself? Denies  Disoriented when walking into a room?  Patient denies   Leaving objects in unusual places?  Denies  Ambulates  with difficulty?  Not as mobile as before, she is weaker. No balance issues.  She uses a  R cane to  ambulate. Recent falls?  Patient denies   Any head injuries?  Patient denies   History of seizures?   Patient denies   Wandering behavior?  Patient denies   Patient drives?   Patient no longer drives   Any mood changes ? Residual depression from her husband dying last November.  She did have grief counseling.  She is also on Paxil, her son is entertaining changing days, as he is concerned that it may be affecting her memory.  He is to discuss with PCP-he just changed PCPs to a geriatric one. Hallucinations?  Patient denies   Paranoia? Denies .  Patient reports that she sleeps an average of  10- 12 hours a day, without vivid dreams, REM behavior or sleepwalking   History of sleep apnea?  Patient denies   Any hygiene concerns?  Patient denies   Independent of bathing and dressing?  Endorsed  Does the patient needs help with medications? Son is in charge, puts them in a pill Environmental education officer. Who is in charge of the finances?  Patient is in charge     Any changes in appetite?   Denies. Decreased water intake, but likes coffee, "but I bought the large 64 ounces tumbler with a straw and that helps to drink more water " Patient have trouble swallowing? Patient denies   Does the patient cook?  Patient denies   Any kitchen accidents such as leaving the stove on? Patient denies   Any headaches?  Patient denies   Double vision? Patient denies   Any focal numbness or tingling?  She has chronic leg paresthesia due to a neck injury in 1971.  No new symptoms of stroke. Chronic back pain Patient denies   Unilateral weakness?  R side is weaker after neck injury 1971.  Any tremors?  Patient denies   Any history of anosmia?  Patient denies   Any incontinence of urine?  History of urge incontinence, OAB controlled with Myrbetriq Any bowel dysfunction?   Patient denies   History of heavy alcohol intake?  Patient denies   History of heavy tobacco use?  Patient denies   Family history of dementia?  Patient denies     MRI brain  03/2021  [facility] Mild generalized age related volume loss without evidence of advanced atrophy or discernible lobar predominance. Mild chronic small-vessel ischemic change of the cerebral hemispheric white matter, less than often seen at this age. Small old cortical infarctions in the anterolateral right temporal lobe and inferior left occipital lobe   Labs 11/19/2021 TSH 3.53, hemoglobin 11.4 otherwise unremarkable, B12 610 (1 06/30/2021) No Known Allergies PREVIOUS MEDICATIONS:   CURRENT MEDICATIONS:  Outpatient Encounter Medications as of 05/26/2022  Medication Sig   acetaminophen (TYLENOL) 325 MG tablet Take 650 mg by mouth every 6 (six) hours as needed.   aspirin EC 81 MG tablet Take 1  tablet (81 mg total) by mouth daily. Swallow whole.   Cyanocobalamin (B-12 PO) Take 1 tablet by mouth daily at 12 noon.   Cyanocobalamin (VITAMIN B 12 PO) Take 250 mg by mouth daily.   donepezil (ARICEPT) 10 MG tablet TAKE 1 TABLET BY MOUTH EVERYDAY AT BEDTIME   mirabegron ER (MYRBETRIQ) 25 MG TB24 tablet Take 1 tablet (25 mg total) by mouth daily.   Multiple Vitamins-Minerals (CENTRUM SILVER ADULT 50+ PO) Take 1 tablet by mouth daily.   polyethylene glycol (MIRALAX / GLYCOLAX) packet Take 17 g by mouth as needed.   simvastatin (ZOCOR) 40 MG tablet TAKE 1 TABLET BY MOUTH EVERYDAY AT BEDTIME   No facility-administered encounter medications on file as of 05/26/2022.       05/26/2022    5:00 PM 12/20/2017    8:56 AM 05/06/2016   10:24 AM  MMSE - Mini Mental State Exam  Orientation to time '1 5 5  '$ Orientation to Place '4 5 5  '$ Registration '3 3 3  '$ Attention/ Calculation 4 0 0  Recall 0 3 3  Language- name 2 objects 2 0 0  Language- repeat '1 1 1  '$ Language- follow 3 step command '3 3 3  '$ Language- read & follow direction 1 0 0  Write a sentence 1 0 0  Copy design 1 0 0  Total score '21 20 20      '$ 11/23/2021    9:00 AM  Montreal Cognitive Assessment   Visuospatial/ Executive (0/5) 1  Naming  (0/3) 2  Attention: Read list of digits (0/2) 2  Attention: Read list of letters (0/1) 1  Attention: Serial 7 subtraction starting at 100 (0/3) 1  Language: Repeat phrase (0/2) 1  Language : Fluency (0/1) 1  Abstraction (0/2) 2  Delayed Recall (0/5) 0  Orientation (0/6) 3  Total 14  Adjusted Score (based on education) 14    Objective:     PHYSICAL EXAMINATION:    VITALS:   Vitals:   05/26/22 1441  BP: (!) 154/86  Pulse: 84  Resp: 18  SpO2: 97%  Weight: 110 lb (49.9 kg)  Height: '5\' 8"'$  (1.727 m)    GEN:  The patient appears stated age and is in NAD. HEENT:  Normocephalic, atraumatic.   Neurological examination:  General: NAD, well-groomed, appears stated age. Orientation: The patient is alert. Oriented to person, place and not to date Cranial nerves: There is good facial symmetry.The speech is fluent and clear. No aphasia or dysarthria. Fund of knowledge is appropriate. Recent and remote memory are impaired. Attention and concentration are reduced.  Able to name objects and repeat phrases.  Hearing is reduced conversational tone, using a device .    Sensation: Sensation is intact to light touch except R LE paresthesia since 1971 Motor: Strength is at least antigravity x 3, R side is weaker after neck injury. She has difficulty ambulating since 1971, uses a R cane  DTR's 2/4 in UE/LE     Movement examination: Tone: There is normal tone in the UE/LE Abnormal movements:  no tremor.  No myoclonus.  No asterixis.   Coordination:  There is no decremation with RAM's. Normal finger to nose  Gait and Station: The patient has some difficulty arising out of a deep-seated chair without the use of the hands. The patient's stride length is good.  Gait is cautious and narrow.    Thank you for allowing [place] the opportunity to participate in the care of this nice patient. Please do  not hesitate to contact us for any questions or concerns.   Total time spent on today's visit was 34  minutes dedicated to this patient today, preparing to see patient, examining the patient, ordering tests and/or medications and counseling the patient, documenting clinical information in the EHR or other health record, independently interpreting results and communicating results to the patient/family, discussing treatment and goals, answering patient's questions and coordinating care.  Cc:  Yvonna Alanis, NP  Sharene Butters 05/26/2022 5:51 PM

## 2022-05-27 ENCOUNTER — Encounter: Payer: Self-pay | Admitting: Orthopedic Surgery

## 2022-05-27 ENCOUNTER — Ambulatory Visit (INDEPENDENT_AMBULATORY_CARE_PROVIDER_SITE_OTHER): Payer: Medicare PPO | Admitting: Orthopedic Surgery

## 2022-05-27 VITALS — BP 136/80 | HR 65 | Temp 97.9°F | Ht 65.03 in | Wt 112.0 lb

## 2022-05-27 DIAGNOSIS — Z0000 Encounter for general adult medical examination without abnormal findings: Secondary | ICD-10-CM

## 2022-05-27 DIAGNOSIS — Z780 Asymptomatic menopausal state: Secondary | ICD-10-CM
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
## 2022-05-27 NOTE — Patient Instructions (Addendum)
    Ms. Cindy Davies , Thank you for taking time to come for your Medicare Wellness Visit. I appreciate your ongoing commitment to your health goals. Please review the following plan we discussed and let me know if I can assist you in the future.   These are the goals we discussed:  Goals      Increase physical activity     Starting 12/20/2017, I will continue to bowl for 2.5-3 hours weekly.      Patient Stated     Would like to drink more water        This is a list of the screening recommended for you and due dates:  Health Maintenance  Topic Date Due   COVID-19 Vaccine (1) Never done   Zoster (Shingles) Vaccine (1 of 2) Never done   DTaP/Tdap/Td vaccine (2 - Tdap) 10/22/2013   Mammogram  06/12/2022   Medicare Annual Wellness Visit  05/28/2023   Pneumonia Vaccine  Completed   Flu Shot  Completed   DEXA scan (bone density measurement)  Completed   HPV Vaccine  Aged Out   Consider getting covid, shingrix (shingles), and tetanus vaccines at local pharmacy  Bone density test need to be done this year> schedule with [facility] [phone]

## 2022-07-12 DIAGNOSIS — M1990 Unspecified osteoarthritis, unspecified site: Secondary | ICD-10-CM | POA: Diagnosis not present

## 2022-07-12 DIAGNOSIS — F32A Depression, unspecified: Secondary | ICD-10-CM | POA: Diagnosis not present

## 2022-07-12 DIAGNOSIS — R4189 Other symptoms and signs involving cognitive functions and awareness: Secondary | ICD-10-CM | POA: Diagnosis not present

## 2022-07-12 DIAGNOSIS — H903 Sensorineural hearing loss, bilateral: Secondary | ICD-10-CM | POA: Diagnosis not present

## 2022-07-12 DIAGNOSIS — H905 Unspecified sensorineural hearing loss: Secondary | ICD-10-CM | POA: Diagnosis not present

## 2022-07-12 DIAGNOSIS — R479 Unspecified speech disturbances: Secondary | ICD-10-CM | POA: Diagnosis not present

## 2022-07-12 DIAGNOSIS — H9190 Unspecified hearing loss, unspecified ear: Secondary | ICD-10-CM | POA: Diagnosis not present

## 2022-07-12 DIAGNOSIS — Z79899 Other long term (current) drug therapy: Secondary | ICD-10-CM | POA: Diagnosis not present

## 2022-07-12 DIAGNOSIS — Z7982 Long term (current) use of aspirin: Secondary | ICD-10-CM | POA: Diagnosis not present

## 2022-07-22 ENCOUNTER — Ambulatory Visit (INDEPENDENT_AMBULATORY_CARE_PROVIDER_SITE_OTHER): Payer: Medicare PPO | Admitting: Orthopedic Surgery

## 2022-07-22 ENCOUNTER — Encounter: Payer: Self-pay | Admitting: Orthopedic Surgery

## 2022-07-22 VITALS — BP 120/80 | HR 80 | Temp 97.5°F | Resp 16 | Ht 65.03 in | Wt 114.0 lb

## 2022-07-22 DIAGNOSIS — M7989 Other specified soft tissue disorders: Secondary | ICD-10-CM | POA: Diagnosis not present

## 2022-07-22 DIAGNOSIS — G309 Alzheimer's disease, unspecified: Secondary | ICD-10-CM | POA: Diagnosis not present

## 2022-07-22 DIAGNOSIS — F0280 Dementia in other diseases classified elsewhere without behavioral disturbance: Secondary | ICD-10-CM

## 2022-07-22 DIAGNOSIS — M25472 Effusion, left ankle: Secondary | ICD-10-CM | POA: Diagnosis not present

## 2022-07-22 DIAGNOSIS — F0150 Vascular dementia without behavioral disturbance: Secondary | ICD-10-CM | POA: Diagnosis not present

## 2022-07-22 MED ORDER — FUROSEMIDE 20 MG PO TABS: 20.0000 mg | ORAL_TABLET | Freq: Every day | ORAL | 3 refills | Status: DC | PRN
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
## 2022-07-22 NOTE — Patient Instructions (Addendum)
Furosemide is a diuretic> take in the morning  Diuretics pull fluids form body and you will urinate excess fluid  Do not take more than 3 days in a row  Contact provider if more than 3 days use  Elevate legs in recliner  Use compression stockings  Ultrasound of left leg ordered to rule out blood clot  Report to ED if any chest pain or shortness of breath occurs

## 2022-07-28 ENCOUNTER — Ambulatory Visit: Payer: Medicare PPO | Attending: Orthopedic Surgery

## 2022-07-28 DIAGNOSIS — M7989 Other specified soft tissue disorders: Secondary | ICD-10-CM

## 2022-09-09 ENCOUNTER — Other Ambulatory Visit: Payer: Self-pay | Admitting: Urology

## 2022-09-30 ENCOUNTER — Ambulatory Visit: Payer: Medicare PPO | Admitting: Orthopedic Surgery

## 2022-09-30 VITALS — BP 142/80 | HR 72 | Temp 97.3°F | Resp 16 | Ht 65.03 in | Wt 113.6 lb

## 2022-09-30 DIAGNOSIS — G309 Alzheimer's disease, unspecified: Secondary | ICD-10-CM

## 2022-09-30 DIAGNOSIS — F0150 Vascular dementia without behavioral disturbance: Secondary | ICD-10-CM | POA: Diagnosis not present

## 2022-09-30 DIAGNOSIS — E782 Mixed hyperlipidemia: Secondary | ICD-10-CM

## 2022-09-30 DIAGNOSIS — N1832 Chronic kidney disease, stage 3b: Secondary | ICD-10-CM | POA: Diagnosis not present

## 2022-09-30 DIAGNOSIS — F0280 Dementia in other diseases classified elsewhere without behavioral disturbance: Secondary | ICD-10-CM

## 2022-09-30 DIAGNOSIS — E038 Other specified hypothyroidism: Secondary | ICD-10-CM | POA: Diagnosis not present

## 2022-09-30 DIAGNOSIS — H903 Sensorineural hearing loss, bilateral: Secondary | ICD-10-CM

## 2022-09-30 MED ORDER — MEMANTINE HCL 5 MG PO TABS: 5.0000 mg | ORAL_TABLET | Freq: Every day | ORAL | 0 refills | Status: DC

## 2022-09-30 MED ORDER — MEMANTINE HCL 5 MG PO TABS: 5.0000 mg | ORAL_TABLET | Freq: Two times a day (BID) | ORAL | 6 refills | Status: DC

## 2022-09-30 NOTE — Patient Instructions (Addendum)
Will start Namenda for memory> 1 tablet daily x 30 days, then increase to 1 tablet twice daily   Try tylenol 650 mg every morning or afternoon to help with knee pain  Reduce fluids in the evening to limit bathroom trips in evening  Avoid caffeine, carbonation and artificial sweeteners
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
## 2022-10-01 LAB — COMPLETE METABOLIC PANEL WITH GFR
AG Ratio: 1.4 (calc) (ref 1.0–2.5)
ALT: 19 U/L (ref 6–29)
AST: 25 U/L (ref 10–35)
Albumin: 4.2 g/dL (ref 3.6–5.1)
Alkaline phosphatase (APISO): 34 U/L — ABNORMAL LOW (ref 37–153)
BUN: 22 mg/dL (ref 7–25)
CO2: 29 mmol/L (ref 20–32)
Calcium: 9.6 mg/dL (ref 8.6–10.4)
Chloride: 104 mmol/L (ref 98–110)
Creat: 0.88 mg/dL (ref 0.60–0.95)
Globulin: 2.9 g/dL (calc) (ref 1.9–3.7)
Glucose, Bld: 85 mg/dL (ref 65–99)
Potassium: 4 mmol/L (ref 3.5–5.3)
Sodium: 144 mmol/L (ref 135–146)
Total Bilirubin: 0.5 mg/dL (ref 0.2–1.2)
Total Protein: 7.1 g/dL (ref 6.1–8.1)
eGFR: 64 mL/min/{1.73_m2} (ref >=60)

## 2022-10-01 LAB — CBC WITH DIFFERENTIAL/PLATELET
Absolute Monocytes: 600 cells/uL (ref 200–950)
Basophils Absolute: 62 cells/uL (ref 0–200)
Basophils Relative: 0.9 %
Eosinophils Absolute: 131 cells/uL (ref 15–500)
Eosinophils Relative: 1.9 %
HCT: 38.1 % (ref 35.0–45.0)
Hemoglobin: 12.5 g/dL (ref 11.7–15.5)
Lymphs Abs: 1573 cells/uL (ref 850–3900)
MCH: 30.6 pg (ref 27.0–33.0)
MCHC: 32.8 g/dL (ref 32.0–36.0)
MCV: 93.2 fL (ref 80.0–100.0)
MPV: 12.2 fL (ref 7.5–12.5)
Monocytes Relative: 8.7 %
Neutro Abs: 4533 cells/uL (ref 1500–7800)
Neutrophils Relative %: 65.7 %
Platelets: 245 10*3/uL (ref 140–400)
RBC: 4.09 10*6/uL (ref 3.80–5.10)
RDW: 12.7 % (ref 11.0–15.0)
Total Lymphocyte: 22.8 %
WBC: 6.9 10*3/uL (ref 3.8–10.8)

## 2022-10-01 LAB — TSH: TSH: 4.41 mIU/L (ref 0.40–4.50)

## 2022-10-01 LAB — LIPID PANEL
Cholesterol: 230 mg/dL — ABNORMAL HIGH (ref <200)
HDL: 73 mg/dL (ref >=50)
LDL Cholesterol (Calc): 139 mg/dL (calc) — ABNORMAL HIGH
Non-HDL Cholesterol (Calc): 157 mg/dL (calc) — ABNORMAL HIGH (ref <130)
Total CHOL/HDL Ratio: 3.2 (calc) (ref <5.0)
Triglycerides: 85 mg/dL (ref <150)

## 2022-10-04 ENCOUNTER — Ambulatory Visit: Payer: Medicare PPO | Admitting: Orthopedic Surgery

## 2022-10-06 DIAGNOSIS — Z461 Encounter for fitting and adjustment of hearing aid: Secondary | ICD-10-CM | POA: Diagnosis not present

## 2022-10-06 DIAGNOSIS — H903 Sensorineural hearing loss, bilateral: Secondary | ICD-10-CM | POA: Diagnosis not present

## 2022-10-14 ENCOUNTER — Ambulatory Visit (INDEPENDENT_AMBULATORY_CARE_PROVIDER_SITE_OTHER): Payer: Medicare PPO | Admitting: Orthopedic Surgery

## 2022-10-14 ENCOUNTER — Encounter: Payer: Self-pay | Admitting: Orthopedic Surgery

## 2022-10-14 VITALS — BP 152/82 | HR 71 | Temp 97.1°F | Resp 16 | Ht 65.03 in | Wt 110.8 lb

## 2022-10-14 DIAGNOSIS — I10 Essential (primary) hypertension: Secondary | ICD-10-CM | POA: Diagnosis not present

## 2022-10-14 DIAGNOSIS — F0150 Vascular dementia without behavioral disturbance: Secondary | ICD-10-CM | POA: Diagnosis not present

## 2022-10-14 DIAGNOSIS — G309 Alzheimer's disease, unspecified: Secondary | ICD-10-CM | POA: Diagnosis not present

## 2022-10-14 DIAGNOSIS — F0280 Dementia in other diseases classified elsewhere without behavioral disturbance: Secondary | ICD-10-CM | POA: Diagnosis not present

## 2022-10-14 MED ORDER — AMLODIPINE BESYLATE 5 MG PO TABS
ORAL_TABLET | ORAL | 4 refills | Status: DC
Start: 2022-10-14 — End: 2022-11-11

## 2022-10-14 NOTE — Patient Instructions (Addendum)
Please take 1/2 tablet x 1 week, then increase to full tablet   Avoid salty foods, < 2000 mg daily
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
## 2022-10-17 ENCOUNTER — Other Ambulatory Visit: Payer: Self-pay | Admitting: Orthopedic Surgery

## 2022-10-17 DIAGNOSIS — M25472 Effusion, left ankle: Secondary | ICD-10-CM

## 2022-11-04 ENCOUNTER — Encounter: Payer: Medicare PPO | Admitting: Orthopedic Surgery

## 2022-11-05 NOTE — Progress Notes (Signed)
This encounter was created in error - please disregard.

## 2022-11-11 ENCOUNTER — Encounter: Payer: Self-pay | Admitting: Orthopedic Surgery

## 2022-11-11 ENCOUNTER — Other Ambulatory Visit: Payer: Self-pay | Admitting: Orthopedic Surgery

## 2022-11-11 ENCOUNTER — Ambulatory Visit (INDEPENDENT_AMBULATORY_CARE_PROVIDER_SITE_OTHER): Payer: Medicare PPO | Admitting: Orthopedic Surgery

## 2022-11-11 VITALS — BP 126/68 | HR 85 | Temp 97.4°F | Resp 16 | Ht 65.03 in | Wt 107.8 lb

## 2022-11-11 DIAGNOSIS — F0280 Dementia in other diseases classified elsewhere without behavioral disturbance: Secondary | ICD-10-CM

## 2022-11-11 DIAGNOSIS — F0150 Vascular dementia without behavioral disturbance: Secondary | ICD-10-CM | POA: Diagnosis not present

## 2022-11-11 DIAGNOSIS — I10 Essential (primary) hypertension: Secondary | ICD-10-CM

## 2022-11-11 DIAGNOSIS — G309 Alzheimer's disease, unspecified: Secondary | ICD-10-CM

## 2022-11-11 MED ORDER — AMLODIPINE BESYLATE 5 MG PO TABS
5.0000 mg | ORAL_TABLET | Freq: Every day | ORAL | 1 refills | Status: DC
Start: 2022-11-11 — End: 2023-07-04
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
## 2022-11-11 NOTE — Telephone Encounter (Signed)
According to the medication list patient is to take once daily, however I will have Coletta Memos, Amy E, NP confirm

## 2022-11-11 NOTE — Patient Instructions (Addendum)
Check Namenda dose- should be one time daily  Weight her 2 times monthly> if she is losing weight> let me know  Eat favorite foods!!!! Especially if only eating one meal daily

## 2022-11-24 ENCOUNTER — Encounter: Payer: Self-pay | Admitting: Physician Assistant

## 2022-11-24 ENCOUNTER — Ambulatory Visit: Payer: Medicare PPO | Admitting: Physician Assistant

## 2022-11-24 VITALS — BP 89/55 | HR 64 | Resp 18 | Ht 65.0 in | Wt 104.0 lb

## 2022-11-24 DIAGNOSIS — F0150 Vascular dementia without behavioral disturbance: Secondary | ICD-10-CM | POA: Diagnosis not present

## 2022-11-24 DIAGNOSIS — G309 Alzheimer's disease, unspecified: Secondary | ICD-10-CM | POA: Diagnosis not present

## 2022-11-24 DIAGNOSIS — F0280 Dementia in other diseases classified elsewhere without behavioral disturbance: Secondary | ICD-10-CM

## 2022-11-24 DIAGNOSIS — R413 Other amnesia: Secondary | ICD-10-CM

## 2022-11-24 NOTE — Patient Instructions (Signed)
It was a pleasure to see you today at our office.   Recommendations:  Continue to control mood as per PCP. Continue donepezil 10 mg daily as per PCP Memantine 5 mg twice a day   Continue B12 supplements  Folllow up  in 6 months   Whom to call:  Memory  decline, memory medications: Call our office [phone]   For psychiatric meds, mood meds: Please have your primary care physician manage these medications.    For assessment of decision of mental capacity and competency:  Call Dr. Erick Blinks, geriatric psychiatrist at [phone]  For guidance in geriatric dementia issues please call [facility] [phone]  For guidance regarding [facility] and if placement were needed at the facility, contact Sidney Ace, Social Worker tel: [phone]  If you have any severe symptoms of a stroke, or other severe issues such as confusion,severe chills or fever, etc call 911 or go to the ER as you may need to be evaluated further       RECOMMENDATIONS FOR ALL PATIENTS WITH MEMORY PROBLEMS: 1. Continue to exercise (Recommend 30 minutes of walking everyday, or 3 hours every week) 2. Increase social interactions - continue going to [place] and enjoy social gatherings with friends and family 3. Eat healthy, avoid fried foods and eat more fruits and vegetables 4. Maintain adequate blood pressure, blood sugar, and blood cholesterol level. Reducing the risk of stroke and cardiovascular disease also helps promoting better memory. 5. Avoid stressful situations. Live a simple life and avoid aggravations. Organize your time and prepare for the next day in anticipation. 6. Sleep well, avoid any interruptions of sleep and avoid any distractions in the bedroom that may interfere with adequate sleep quality 7. Avoid sugar, avoid sweets as there is a strong link between excessive sugar intake, diabetes, and cognitive impairment We discussed the Mediterranean diet, which  has been shown to help patients reduce the risk of progressive memory disorders and reduces cardiovascular risk. This includes eating fish, eat fruits and green leafy vegetables, nuts like almonds and hazelnuts, walnuts, and also use olive oil. Avoid fast foods and fried foods as much as possible. Avoid sweets and sugar as sugar use has been linked to worsening of memory function.  There is always a concern of gradual progression of memory problems. If this is the case, then we may need to adjust level of care according to patient needs. Support, both to the patient and caregiver, should then be put into place.        FALL PRECAUTIONS: Be cautious when walking. Scan the area for obstacles that may increase the risk of trips and falls. When getting up in the mornings, sit up at the edge of the bed for a few minutes before getting out of bed. Consider elevating the bed at the head end to avoid drop of blood pressure when getting up. Walk always in a well-lit room (use night lights in the walls). Avoid area rugs or power cords from appliances in the middle of the walkways. Use a walker or a cane if necessary and consider physical therapy for balance exercise. Get your eyesight checked regularly.  FINANCIAL OVERSIGHT: Supervision, especially oversight when making financial decisions or transactions is also recommended.  HOME SAFETY: Consider the safety of the kitchen when operating appliances like stoves, microwave oven, and blender. Consider having supervision and share cooking responsibilities until no longer able to participate in those. Accidents with firearms and other hazards in the house  should be identified and addressed as well.   ABILITY TO BE LEFT ALONE: If patient is unable to contact 911 operator, consider using LifeLine, or when the need is there, arrange for someone to stay with patients. Smoking is a fire hazard, consider supervision or cessation. Risk of wandering should be assessed by  caregiver and if detected at any point, supervision and safe proof recommendations should be instituted.  MEDICATION SUPERVISION: Inability to self-administer medication needs to be constantly addressed. Implement a mechanism to ensure safe administration of the medications.   DRIVING: Regarding driving, in patients with progressive memory problems, driving will be impaired. We advise to have someone else do the driving if trouble finding directions or if minor accidents are reported. Independent driving assessment is available to determine safety of driving.   If you are interested in the driving assessment, you can contact the following:  [facility] [employer] [facility] in [place] [phone]  [facility] [phone]  [facility] [phone] or [phone]    Mediterranean Diet A Mediterranean diet refers to food and lifestyle choices that are based on the traditions of countries located on the [employer] [place]. This way of eating has been shown to help prevent certain conditions and improve outcomes for people who have chronic diseases, like kidney disease and heart disease. What are tips for following this plan? Lifestyle  Cook and eat meals together with your family, when possible. Drink enough fluid to keep your urine clear or pale yellow. Be physically active every day. This includes: Aerobic exercise like running or swimming. Leisure activities like gardening, walking, or housework. Get 7-8 hours of sleep each night. If recommended by your health care provider, drink red wine in moderation. This means 1 glass a day for nonpregnant women and 2 glasses a day for men. A glass of wine equals 5 oz (150 mL). Reading food labels  Check the serving size of packaged foods. For foods such as rice and pasta, the serving size refers to the amount of cooked product, not dry. Check the total fat in packaged foods. Avoid foods  that have saturated fat or trans fats. Check the ingredients list for added sugars, such as corn syrup. Shopping  At the grocery store, buy most of your food from the areas near the walls of the store. This includes: Fresh fruits and vegetables (produce). Grains, beans, nuts, and seeds. Some of these may be available in unpackaged forms or large amounts (in bulk). Fresh seafood. Poultry and eggs. Low-fat dairy products. Buy whole ingredients instead of prepackaged foods. Buy fresh fruits and vegetables in-season from local farmers markets. Buy frozen fruits and vegetables in resealable bags. If you do not have access to quality fresh seafood, buy precooked frozen shrimp or canned fish, such as tuna, Pardi, or sardines. Buy small amounts of raw or cooked vegetables, salads, or olives from the deli or salad bar at your store. Stock your pantry so you always have certain foods on hand, such as olive oil, canned tuna, canned tomatoes, rice, pasta, and beans. Cooking  Cook foods with extra-virgin olive oil instead of using butter or other vegetable oils. Have meat as a side dish, and have vegetables or grains as your main dish. This means having meat in small portions or adding small amounts of meat to foods like pasta or stew. Use beans or vegetables instead of meat in common dishes like chili or lasagna. Experiment with different cooking methods. Try roasting or broiling vegetables  instead of steaming or sauteing them. Add frozen vegetables to soups, stews, pasta, or rice. Add nuts or seeds for added healthy fat at each meal. You can add these to yogurt, salads, or vegetable dishes. Marinate fish or vegetables using olive oil, lemon juice, garlic, and fresh herbs. Meal planning  Plan to eat 1 vegetarian meal one day each week. Try to work up to 2 vegetarian meals, if possible. Eat seafood 2 or more times a week. Have healthy snacks readily available, such as: Vegetable sticks with  hummus. Greek yogurt. Fruit and nut trail mix. Eat balanced meals throughout the week. This includes: Fruit: 2-3 servings a day Vegetables: 4-5 servings a day Low-fat dairy: 2 servings a day Fish, poultry, or lean meat: 1 serving a day Beans and legumes: 2 or more servings a week Nuts and seeds: 1-2 servings a day Whole grains: 6-8 servings a day Extra-virgin olive oil: 3-4 servings a day Limit red meat and sweets to only a few servings a month What are my food choices? Mediterranean diet Recommended Grains: Whole-grain pasta. Brown rice. Bulgar wheat. Polenta. Couscous. Whole-wheat bread. Orpah Cobb. Vegetables: Artichokes. Beets. Broccoli. Cabbage. Carrots. Eggplant. Green beans. Chard. Kale. Spinach. Onions. Leeks. Peas. Squash. Tomatoes. Peppers. Radishes. Fruits: Apples. Apricots. Avocado. Berries. Bananas. Cherries. Dates. Figs. Grapes. Lemons. Melon. Oranges. Peaches. Plums. Pomegranate. Meats and other protein foods: Beans. Almonds. Sunflower seeds. Pine nuts. Peanuts. Cod. [place]. Scallops. Shrimp. Tuna. Tilapia. Clams. Oysters. Eggs. Dairy: Low-fat milk. Cheese. Greek yogurt. Beverages: Water. Red wine. Herbal tea. Fats and oils: Extra virgin olive oil. Avocado oil. Grape seed oil. Sweets and desserts: [place] yogurt with honey. Baked apples. Poached pears. Trail mix. Seasoning and other foods: Basil. Cilantro. Coriander. Cumin. Mint. Parsley. Sage. Rosemary. Tarragon. Garlic. Oregano. Thyme. Pepper. Balsalmic vinegar. Tahini. Hummus. Tomato sauce. Olives. Mushrooms. Limit these Grains: Prepackaged pasta or rice dishes. Prepackaged cereal with added sugar. Vegetables: Deep fried potatoes (french fries). Fruits: Fruit canned in syrup. Meats and other protein foods: Beef. Pork. Lamb. Poultry with skin. Hot dogs. Tomasa Blase. Dairy: Ice cream. Sour cream. Whole milk. Beverages: Juice. Sugar-sweetened soft drinks. Beer. Liquor and spirits. Fats and oils: Butter. Canola oil.  Vegetable oil. Beef fat (tallow). Lard. Sweets and desserts: Cookies. Cakes. Pies. Candy. Seasoning and other foods: Mayonnaise. Premade sauces and marinades. The items listed may not be a complete list. Talk with your dietitian about what dietary choices are right for you. Summary The Mediterranean diet includes both food and lifestyle choices. Eat a variety of fresh fruits and vegetables, beans, nuts, seeds, and whole grains. Limit the amount of red meat and sweets that you eat. Talk with your health care provider about whether it is safe for you to drink red wine in moderation. This means 1 glass a day for nonpregnant women and 2 glasses a day for men. A glass of wine equals 5 oz (150 mL). This information is not intended to replace advice given to you by your health care provider. Make sure you discuss any questions you have with your health care provider. Document Released: 12/04/2015 Document Revised: 01/06/2016 Document Reviewed: 12/04/2015 Elsevier Interactive Patient Education  2017 [employer].

## 2022-11-24 NOTE — Progress Notes (Incomplete)
Assessment/Plan:   Dementia, likely due to Alzheimer's disease and vascular etiology  Cindy Davies is a very pleasant 89 y.o. 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 Patient is currently on donepezil 10 mg daily and memantine 5mg  twice daily.     Follow up in  6 months. Continue B12 supplements Continue donepezil 10 mg daily, and memantine 5mg  twice daily side effects discussed Continue to manage severe HOH with audiology Recommend good control of her cardiovascular risk factors Continue to control mood as per PCP     Subjective:    This patient is accompanied in the office by her son who supplements the history.  Previous records as well as any outside records available were reviewed prior to todays visit. Patient was last seen on 05/26/2022, MMSE of 21/30.***   Any changes in memory since last visit? " STM a little worse, especially over the last 2-3 weeks, maybe a change in medication".  Sometimes she has difficulty remembering recent conversations, but she still able to remember people's names.  She likes word search, watching TV.  Her long-term memory is good. repeats oneself?  Endorsed, but "she always did that "son says Disoriented when walking into a room?  Patient denies ***  Leaving objects in unusual places?  May misplace things but not in unusual places, remote***  Wandering behavior?  denies   Any personality changes since last visit?  denies   Any worsening depression?:  Denies.   Hallucinations or paranoia?  Denies.   Seizures? denies    Any sleep changes?  Sleeps well.  Denies vivid dreams, REM behavior or sleepwalking   Sleep apnea?   Denies.   Any hygiene concerns?  Her son reports that she does  not shower as often as before..  Independent of bathing and dressing?  Endorsed  Does the patient needs help with medications?  Son sets a pillbox weekly*** Who is in charge of the finances?  Son is in charge   *** Any changes in appetite?  denies.  She always liked sweets***   Patient have trouble swallowing? Denies.   Does the patient cook?  Yes.*** Any headaches?   denies   Chronic back pain  denies   Ambulates with difficulty?  Since 1971, the patient has difficulty walking after an accident, uses a right cane for stability.*** Recent falls or head injuries? denies     Unilateral weakness, numbness or tingling?  She has chronic right lower extremity paresthesia since 1971. Any tremors?  Denies   Any anosmia?  Denies   Any incontinence of urine?  Endorsed, wears diapers Any bowel dysfunction?   Denies      Patient lives with her son Cindy Davies*** Does the patient drive? No longer drives ***   MRI brain December 2022 was reviewed, showing mild generalized age-related volume loss, without evidence of advanced atrophy or lobar predominance.  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 For about 3 years, worse since 03/17/23 after her husband death, STM < LTM.  Sometimes she forgets names and dates.  She is severely hard of hearing, so several times, "  she will make her own answer because she cannot hear well ".  Her son reports that she was "never very bright ".  She does enjoy doing crossword puzzles. Patient lives with: Her son repeats oneself? Denies  Disoriented when walking into a room?  Patient denies   Leaving objects in unusual places?  Denies  Ambulates  with difficulty?  Not as mobile as before, she is weaker. No balance issues.  She uses a  R cane to ambulate. Recent falls?  Patient  denies   Any head injuries?  Patient denies   History of seizures?   Patient denies   Wandering behavior?  Patient denies   Patient drives?   Patient no longer drives   Any mood changes ? Residual depression from her husband dying last November.  She did have grief counseling.  She is also on Paxil, her son is entertaining changing days, as he is concerned that it may be affecting her memory.  He is to discuss with PCP-he just changed PCPs to a geriatric one. Hallucinations?  Patient denies   Paranoia? Denies .  Patient reports that she sleeps an average of  10- 12 hours a day, without vivid dreams, REM behavior or sleepwalking   History of sleep apnea?  Patient denies   Any hygiene concerns?  Patient denies   Independent of bathing and dressing?  Endorsed  Does the patient needs help with medications? Son is in charge, puts them in a pill Dance movement psychotherapist. Who is in charge of the finances?  Patient is in charge     Any changes in appetite?   Denies. Decreased water intake, but likes coffee, "but I bought the large 64 ounces tumbler with a straw and that helps to drink more water " Patient have trouble swallowing? Patient denies   Does the patient cook?  Patient denies   Any kitchen accidents such as leaving the stove on? Patient denies   Any headaches?  Patient denies   Double vision? Patient denies   Any focal numbness or tingling?  She has chronic leg paresthesia due to a neck injury in 1971.  No new symptoms of stroke. Chronic back pain Patient denies   Unilateral weakness?  R side is weaker after neck injury 1971.  Any tremors?  Patient denies   Any history of anosmia?  Patient denies   Any incontinence of urine?  History of urge incontinence, OAB controlled with Myrbetriq Any bowel dysfunction?   Patient denies   History of heavy alcohol intake?  Patient denies   History of heavy tobacco use?  Patient denies   Family history of dementia?  Patient denies  PREVIOUS MEDICATIONS:    CURRENT MEDICATIONS:  Outpatient Encounter Medications as of 11/24/2022  Medication Sig   acetaminophen (TYLENOL) 325 MG tablet Take 650 mg by mouth every 6 (six) hours as needed.   amLODipine (NORVASC) 5 MG tablet Take 1 tablet (5 mg total) by mouth daily.   aspirin EC 81 MG tablet Take 1 tablet (81 mg total) by mouth daily. Swallow whole.   Cholecalciferol (D3 2000 PO) Take 1 tablet by mouth daily.   Cyanocobalamin (VITAMIN B 12 PO) Take 250 mg by mouth daily.   donepezil (ARICEPT) 10 MG tablet TAKE 1 TABLET BY MOUTH EVERYDAY AT BEDTIME   furosemide (LASIX) 20 MG tablet TAKE 1 TABLET (20 MG TOTAL) BY MOUTH DAILY AS NEEDED. TAKE DAILY AS NEEDED FOR LOWER LEG SWELLING.   MAGNESIUM GLUCONATE PO Take 1 tablet by mouth  daily.   memantine (NAMENDA) 5 MG tablet Take 5 mg by mouth 2 (two) times daily.   Menaquinone-7 (K2 PO) Take 1 tablet by mouth daily.   Multiple Vitamins-Minerals (CENTRUM SILVER ADULT 50+ PO) Take 1 tablet by mouth daily.   polyethylene glycol (MIRALAX / GLYCOLAX) packet Take 17 g by mouth as needed.   simvastatin (ZOCOR) 40 MG tablet TAKE 1 TABLET BY MOUTH EVERYDAY AT BEDTIME   No facility-administered encounter medications on file as of 11/24/2022.       05/26/2022    5:00 PM 12/20/2017    8:56 AM 05/06/2016   10:24 AM  MMSE - Mini Mental State Exam  Orientation to time 1 5 5   Orientation to Place 4 5 5   Registration 3 3 3   Attention/ Calculation 4 0 0  Recall 0 3 3  Language- name 2 objects 2 0 0  Language- repeat 1 1 1   Language- follow 3 step command 3 3 3   Language- read & follow direction 1 0 0  Write a sentence 1 0 0  Copy design 1 0 0  Total score 21 20 20       11/23/2021    9:00 AM  Montreal Cognitive Assessment   Visuospatial/ Executive (0/5) 1  Naming (0/3) 2  Attention: Read list of digits (0/2) 2  Attention: Read list of letters (0/1) 1  Attention: Serial 7 subtraction starting at 100 (0/3) 1  Language: Repeat phrase (0/2) 1  Language :  Fluency (0/1) 1  Abstraction (0/2) 2  Delayed Recall (0/5) 0  Orientation (0/6) 3  Total 14  Adjusted Score (based on education) 14    Objective:     PHYSICAL EXAMINATION:    VITALS:  There were no vitals filed for this visit.  GEN:  The patient appears stated age and is in NAD. HEENT:  Normocephalic, atraumatic.   Neurological examination:  General: NAD, well-groomed, appears stated age. Orientation: The patient is alert. Oriented to person, place and not date Cranial nerves: There is good facial symmetry.The speech is fluent and clear. No aphasia or dysarthria. Fund of knowledge is appropriate. Recent and remote memory are impaired. Attention and concentration are reduced.  Able to name objects and repeat phrases.  Hearing is very reduced t to conversational tone, uses a device. *** Sensation: Sensation is intact to light touch except right lower extremity paresthesia since 1971. Motor: Strength is at least antigravity x3.  Right side is weaker after neck injury.  She uses a right cane. DTR's 2/4 in UE/LE     Movement examination: Tone: There is normal tone in the UE/LE Abnormal movements:  no tremor.  No myoclonus.  No asterixis.   Coordination:  There is no decremation with RAM's. Normal finger to nose  Gait and Station: The patient has no difficulty arising out of a deep-seated chair without the use of the hands. The patient's stride length is good.  Gait is cautious and narrow.    Thank you for allowing [place] the opportunity to participate in the care of this nice patient. Please do not hesitate to contact us for any questions or concerns.   Total time spent on today's visit was *** minutes dedicated to this patient today, preparing to see patient, examining the patient, ordering tests and/or medications and counseling the patient, documenting clinical information in the EHR or other health record, independently interpreting results and communicating results to the  patient/family, discussing treatment and goals, answering patient's questions and coordinating care.  Cc:  Octavia Heir, NP  Marlowe Kays 11/24/2022 7:13 AM

## 2023-01-10 ENCOUNTER — Ambulatory Visit
Admission: RE | Admit: 2023-01-10 | Discharge: 2023-01-10 | Disposition: A | Discharge status: Home / Self Care | Payer: Medicare PPO | Source: Ambulatory Visit | Attending: Orthopedic Surgery | Admitting: Orthopedic Surgery

## 2023-01-10 DIAGNOSIS — N958 Other specified menopausal and perimenopausal disorders: Secondary | ICD-10-CM | POA: Diagnosis not present

## 2023-01-10 DIAGNOSIS — M8588 Other specified disorders of bone density and structure, other site: Secondary | ICD-10-CM | POA: Diagnosis not present

## 2023-01-10 DIAGNOSIS — E349 Endocrine disorder, unspecified: Secondary | ICD-10-CM | POA: Diagnosis not present

## 2023-01-31 ENCOUNTER — Other Ambulatory Visit: Payer: Self-pay | Admitting: Orthopedic Surgery

## 2023-01-31 ENCOUNTER — Encounter: Payer: Self-pay | Admitting: Orthopedic Surgery

## 2023-01-31 DIAGNOSIS — N3281 Overactive bladder: Secondary | ICD-10-CM

## 2023-01-31 MED ORDER — MIRABEGRON ER 25 MG PO TB24
25.0000 mg | ORAL_TABLET | Freq: Every day | ORAL | 5 refills | Status: DC
Start: 2023-01-31 — End: 2023-04-21

## 2023-02-05 ENCOUNTER — Emergency Department (HOSPITAL_COMMUNITY): Payer: Medicare PPO

## 2023-02-05 ENCOUNTER — Emergency Department (HOSPITAL_COMMUNITY)
Admission: EM | Admit: 2023-02-05 | Discharge: 2023-02-05 | Disposition: A | Discharge status: Home / Self Care | Payer: Medicare PPO | Attending: Emergency Medicine | Admitting: Emergency Medicine

## 2023-02-05 ENCOUNTER — Other Ambulatory Visit: Payer: Self-pay

## 2023-02-05 ENCOUNTER — Encounter (HOSPITAL_COMMUNITY): Payer: Self-pay

## 2023-02-05 DIAGNOSIS — F0390 Unspecified dementia without behavioral disturbance: Secondary | ICD-10-CM | POA: Diagnosis not present

## 2023-02-05 DIAGNOSIS — W208XXA Other cause of strike by thrown, projected or falling object, initial encounter: Secondary | ICD-10-CM | POA: Diagnosis not present

## 2023-02-05 DIAGNOSIS — S0093XA Contusion of unspecified part of head, initial encounter: Secondary | ICD-10-CM | POA: Diagnosis not present

## 2023-02-05 DIAGNOSIS — Z7982 Long term (current) use of aspirin: Secondary | ICD-10-CM | POA: Insufficient documentation

## 2023-02-05 DIAGNOSIS — S0990XA Unspecified injury of head, initial encounter: Secondary | ICD-10-CM | POA: Diagnosis not present

## 2023-02-05 NOTE — ED Triage Notes (Signed)
Pt has dementia and is hard of hearing  Pt denies pain and does not have a story of why she is in ED  Per son, pt was reaching into cabinet and a pot from the top shelf fell off and hit the pt in the head.   Son stated that pt is acting normal for herself at this time. Son denies LOC. No thinners.

## 2023-02-05 NOTE — ED Provider Notes (Signed)
[place] EMERGENCY DEPARTMENT AT [facility] Provider Note   CSN: [id number] Arrival date & time: 02/05/23  2123     History {Add pertinent medical, surgical, social history, OB history to HPI:1} Chief Complaint  Patient presents with   Head Injury    Cindy Davies is a 89 y.o. female.  HPI     Home Medications Prior to Admission medications   Medication Sig Start Date End Date Taking? Authorizing Provider  acetaminophen (TYLENOL) 325 MG tablet Take 650 mg by mouth every 6 (six) hours as needed. 03/05/17   [provider]  amLODipine (NORVASC) 5 MG tablet Take 1 tablet (5 mg total) by mouth daily. 11/11/22   Octavia Heir, NP  aspirin EC 81 MG tablet Take 1 tablet (81 mg total) by mouth daily. Swallow whole. 03/25/22   Fargo, Amy E, NP  Cholecalciferol (D3 2000 PO) Take 1 tablet by mouth daily.    [provider]  Cyanocobalamin (VITAMIN B 12 PO) Take 250 mg by mouth daily.    [provider]  donepezil (ARICEPT) 10 MG tablet TAKE 1 TABLET BY MOUTH EVERYDAY AT BEDTIME 04/23/22   Fargo, Amy E, NP  furosemide (LASIX) 20 MG tablet TAKE 1 TABLET (20 MG TOTAL) BY MOUTH DAILY AS NEEDED. TAKE DAILY AS NEEDED FOR LOWER LEG SWELLING. 10/18/22   Fargo, Amy E, NP  MAGNESIUM GLUCONATE PO Take 1 tablet by mouth daily.    [provider]  memantine (NAMENDA) 5 MG tablet Take 5 mg by mouth 2 (two) times daily.    [provider]  Menaquinone-7 (K2 PO) Take 1 tablet by mouth daily.    [provider]  mirabegron ER (MYRBETRIQ) 25 MG TB24 tablet Take 1 tablet (25 mg total) by mouth daily. 01/31/23   Fargo, Amy E, NP  Multiple Vitamins-Minerals (CENTRUM SILVER ADULT 50+ PO) Take 1 tablet by mouth daily.    [provider]  polyethylene glycol (MIRALAX / GLYCOLAX) packet Take 17 g by mouth as needed.    [provider]  simvastatin (ZOCOR) 40 MG tablet TAKE 1 TABLET BY MOUTH EVERYDAY AT BEDTIME 04/23/22   Hazle Nordmann  E, NP      Allergies    Patient has no known allergies.    Review of Systems   Review of Systems  Physical Exam Updated Vital Signs BP (!) 153/60 (BP Location: Right Arm)   Pulse 80   Temp 99 F (37.2 C) (Oral)   Resp 18   Ht 5\' 5"  (1.651 m)   Wt 50.3 kg   SpO2 98%   BMI 18.47 kg/m  Physical Exam  ED Results / Procedures / Treatments   Labs (all labs ordered are listed, but only abnormal results are displayed) Labs Reviewed - No data to display  EKG None  Radiology CT Head Wo Contrast  Result Date: 02/05/2023 CLINICAL DATA:  head trauma.  Heavy object fell on head. EXAM: CT HEAD WITHOUT CONTRAST TECHNIQUE: Contiguous axial images were obtained from the base of the skull through the vertex without intravenous contrast. RADIATION DOSE REDUCTION: This exam was performed according to the departmental dose-optimization program which includes automated exposure control, adjustment of the mA and/or kV according to patient size and/or use of iterative reconstruction technique. COMPARISON:  06/11/2016 FINDINGS: Brain: There is atrophy and chronic small vessel disease changes. No acute intracranial abnormality. Specifically, no hemorrhage, hydrocephalus, mass lesion, acute infarction, or significant intracranial injury. Vascular: No hyperdense vessel or unexpected calcification.  Skull: No acute calvarial abnormality. Sinuses/Orbits: No acute findings Other: None IMPRESSION: Atrophy, chronic microvascular disease. No acute intracranial abnormality. Electronically Signed   By: Charlett Nose M.D.   On: 02/05/2023 22:30    Procedures Procedures  {Document cardiac monitor, telemetry assessment procedure when appropriate:1}  Medications Ordered in ED Medications - No data to display  ED Course/ Medical Decision Making/ A&P   {   Click here for ABCD2, HEART and other calculatorsREFRESH Note before signing :1}                              Medical Decision Making Amount and/or  Complexity of Data Reviewed Radiology: ordered.   ***  {Document critical care time when appropriate:1} {Document review of labs and clinical decision tools ie heart score, Chads2Vasc2 etc:1}  {Document your independent review of radiology images, and any outside records:1} {Document your discussion with family members, caretakers, and with consultants:1} {Document social determinants of health affecting pt's care:1} {Document your decision making why or why not admission, treatments were needed:1} Final Clinical Impression(s) / ED Diagnoses Final diagnoses:  Injury of head, initial encounter    Rx / DC Orders ED Discharge Orders     None

## 2023-02-07 ENCOUNTER — Ambulatory Visit: Payer: Medicare PPO | Admitting: Orthopedic Surgery

## 2023-02-08 ENCOUNTER — Encounter: Payer: Self-pay | Admitting: Family

## 2023-02-08 ENCOUNTER — Ambulatory Visit (INDEPENDENT_AMBULATORY_CARE_PROVIDER_SITE_OTHER): Payer: Medicare PPO | Admitting: Family

## 2023-02-08 VITALS — BP 128/60 | Temp 96.5°F | Resp 18 | Ht 65.0 in | Wt 109.8 lb

## 2023-02-08 DIAGNOSIS — F0150 Vascular dementia without behavioral disturbance: Secondary | ICD-10-CM

## 2023-02-08 DIAGNOSIS — G309 Alzheimer's disease, unspecified: Secondary | ICD-10-CM | POA: Diagnosis not present

## 2023-02-08 DIAGNOSIS — F0280 Dementia in other diseases classified elsewhere without behavioral disturbance: Secondary | ICD-10-CM | POA: Diagnosis not present

## 2023-02-08 DIAGNOSIS — I10 Essential (primary) hypertension: Secondary | ICD-10-CM | POA: Diagnosis not present

## 2023-02-08 DIAGNOSIS — R296 Repeated falls: Secondary | ICD-10-CM

## 2023-02-08 DIAGNOSIS — R2681 Unsteadiness on feet: Secondary | ICD-10-CM | POA: Diagnosis not present
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
## 2023-02-08 NOTE — Patient Instructions (Addendum)
cleanse with saline or antibacterial soap and warm water  ,pat dry, triple antibiotic ointment applied and covered with foam dressing for extra protection and absorption.change dressing every 3 days and as needed if soiled.

## 2023-02-11 ENCOUNTER — Ambulatory Visit: Payer: Medicare PPO | Attending: Family | Admitting: Physical Therapy

## 2023-02-11 DIAGNOSIS — F0280 Dementia in other diseases classified elsewhere without behavioral disturbance: Secondary | ICD-10-CM | POA: Insufficient documentation

## 2023-02-11 DIAGNOSIS — I10 Essential (primary) hypertension: Secondary | ICD-10-CM | POA: Diagnosis not present

## 2023-02-11 DIAGNOSIS — R2681 Unsteadiness on feet: Secondary | ICD-10-CM | POA: Insufficient documentation

## 2023-02-11 DIAGNOSIS — R296 Repeated falls: Secondary | ICD-10-CM | POA: Diagnosis not present

## 2023-02-11 DIAGNOSIS — G8222 Paraplegia, incomplete: Secondary | ICD-10-CM | POA: Diagnosis not present

## 2023-02-11 DIAGNOSIS — M6281 Muscle weakness (generalized): Secondary | ICD-10-CM | POA: Diagnosis not present

## 2023-02-11 DIAGNOSIS — G309 Alzheimer's disease, unspecified: Secondary | ICD-10-CM | POA: Insufficient documentation

## 2023-02-11 DIAGNOSIS — R2689 Other abnormalities of gait and mobility: Secondary | ICD-10-CM | POA: Insufficient documentation

## 2023-02-11 DIAGNOSIS — F0150 Vascular dementia without behavioral disturbance: Secondary | ICD-10-CM | POA: Diagnosis not present

## 2023-02-11 DIAGNOSIS — Z9181 History of falling: Secondary | ICD-10-CM | POA: Diagnosis not present
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
## 2023-02-16 ENCOUNTER — Ambulatory Visit: Payer: Medicare PPO

## 2023-02-16 DIAGNOSIS — R2681 Unsteadiness on feet: Secondary | ICD-10-CM | POA: Diagnosis not present

## 2023-02-16 DIAGNOSIS — M6281 Muscle weakness (generalized): Secondary | ICD-10-CM | POA: Diagnosis not present

## 2023-02-16 DIAGNOSIS — F0150 Vascular dementia without behavioral disturbance: Secondary | ICD-10-CM | POA: Diagnosis not present

## 2023-02-16 DIAGNOSIS — R2689 Other abnormalities of gait and mobility: Secondary | ICD-10-CM | POA: Diagnosis not present

## 2023-02-16 DIAGNOSIS — I10 Essential (primary) hypertension: Secondary | ICD-10-CM | POA: Diagnosis not present

## 2023-02-16 DIAGNOSIS — R296 Repeated falls: Secondary | ICD-10-CM | POA: Diagnosis not present

## 2023-02-16 DIAGNOSIS — Z9181 History of falling: Secondary | ICD-10-CM | POA: Diagnosis not present

## 2023-02-16 DIAGNOSIS — G8222 Paraplegia, incomplete: Secondary | ICD-10-CM | POA: Diagnosis not present

## 2023-02-16 DIAGNOSIS — G309 Alzheimer's disease, unspecified: Secondary | ICD-10-CM | POA: Diagnosis not present
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
## 2023-02-21 ENCOUNTER — Ambulatory Visit: Payer: Medicare PPO

## 2023-02-21 VITALS — BP 113/71 | HR 93

## 2023-02-21 DIAGNOSIS — Z9181 History of falling: Secondary | ICD-10-CM | POA: Diagnosis not present

## 2023-02-21 DIAGNOSIS — G309 Alzheimer's disease, unspecified: Secondary | ICD-10-CM | POA: Diagnosis not present

## 2023-02-21 DIAGNOSIS — I10 Essential (primary) hypertension: Secondary | ICD-10-CM | POA: Diagnosis not present

## 2023-02-21 DIAGNOSIS — M6281 Muscle weakness (generalized): Secondary | ICD-10-CM | POA: Diagnosis not present

## 2023-02-21 DIAGNOSIS — R296 Repeated falls: Secondary | ICD-10-CM | POA: Diagnosis not present

## 2023-02-21 DIAGNOSIS — R2681 Unsteadiness on feet: Secondary | ICD-10-CM | POA: Diagnosis not present

## 2023-02-21 DIAGNOSIS — F0150 Vascular dementia without behavioral disturbance: Secondary | ICD-10-CM | POA: Diagnosis not present

## 2023-02-21 DIAGNOSIS — G8222 Paraplegia, incomplete: Secondary | ICD-10-CM | POA: Diagnosis not present

## 2023-02-21 DIAGNOSIS — R2689 Other abnormalities of gait and mobility: Secondary | ICD-10-CM
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
## 2023-02-28 ENCOUNTER — Encounter: Payer: Self-pay | Admitting: Physical Therapy

## 2023-02-28 ENCOUNTER — Ambulatory Visit: Payer: Medicare PPO | Attending: Orthopedic Surgery | Admitting: Physical Therapy

## 2023-02-28 DIAGNOSIS — R2681 Unsteadiness on feet: Secondary | ICD-10-CM | POA: Insufficient documentation

## 2023-02-28 DIAGNOSIS — M6281 Muscle weakness (generalized): Secondary | ICD-10-CM | POA: Insufficient documentation

## 2023-02-28 DIAGNOSIS — R2689 Other abnormalities of gait and mobility: Secondary | ICD-10-CM | POA: Diagnosis not present

## 2023-02-28 DIAGNOSIS — Z9181 History of falling: Secondary | ICD-10-CM | POA: Insufficient documentation

## 2023-02-28 DIAGNOSIS — G8222 Paraplegia, incomplete: Secondary | ICD-10-CM | POA: Insufficient documentation
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
## 2023-03-07 ENCOUNTER — Ambulatory Visit: Payer: Medicare PPO | Admitting: Physical Therapy

## 2023-03-07 DIAGNOSIS — R2689 Other abnormalities of gait and mobility: Secondary | ICD-10-CM | POA: Diagnosis not present

## 2023-03-07 DIAGNOSIS — G8222 Paraplegia, incomplete: Secondary | ICD-10-CM | POA: Diagnosis not present

## 2023-03-07 DIAGNOSIS — M6281 Muscle weakness (generalized): Secondary | ICD-10-CM | POA: Diagnosis not present

## 2023-03-07 DIAGNOSIS — R2681 Unsteadiness on feet: Secondary | ICD-10-CM

## 2023-03-07 DIAGNOSIS — Z9181 History of falling: Secondary | ICD-10-CM | POA: Diagnosis not present
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
## 2023-03-14 ENCOUNTER — Ambulatory Visit: Payer: Medicare PPO | Admitting: Physical Therapy

## 2023-03-14 VITALS — BP 117/51 | HR 81

## 2023-03-14 DIAGNOSIS — Z9181 History of falling: Secondary | ICD-10-CM | POA: Diagnosis not present

## 2023-03-14 DIAGNOSIS — R2681 Unsteadiness on feet: Secondary | ICD-10-CM | POA: Diagnosis not present

## 2023-03-14 DIAGNOSIS — R2689 Other abnormalities of gait and mobility: Secondary | ICD-10-CM

## 2023-03-14 DIAGNOSIS — M6281 Muscle weakness (generalized): Secondary | ICD-10-CM | POA: Diagnosis not present

## 2023-03-14 DIAGNOSIS — G8222 Paraplegia, incomplete: Secondary | ICD-10-CM | POA: Diagnosis not present
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
## 2023-03-15 ENCOUNTER — Ambulatory Visit (INDEPENDENT_AMBULATORY_CARE_PROVIDER_SITE_OTHER): Payer: Medicare PPO | Admitting: Family

## 2023-03-15 ENCOUNTER — Encounter: Payer: Self-pay | Admitting: Family

## 2023-03-15 VITALS — BP 118/70 | HR 70 | Temp 97.1°F | Resp 16 | Ht 65.0 in | Wt 99.2 lb

## 2023-03-15 DIAGNOSIS — R2681 Unsteadiness on feet: Secondary | ICD-10-CM | POA: Diagnosis not present

## 2023-03-15 DIAGNOSIS — S00432A Contusion of left ear, initial encounter: Secondary | ICD-10-CM

## 2023-03-15 DIAGNOSIS — R296 Repeated falls: Secondary | ICD-10-CM
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
## 2023-03-15 NOTE — Patient Instructions (Signed)
Take Tylenol 500 mg tablet one by mouth every 8 hrs as needed for pain

## 2023-03-21 ENCOUNTER — Ambulatory Visit: Payer: Medicare PPO | Admitting: Physical Therapy

## 2023-03-21 VITALS — BP 121/60 | HR 79

## 2023-03-21 DIAGNOSIS — Z9181 History of falling: Secondary | ICD-10-CM | POA: Diagnosis not present

## 2023-03-21 DIAGNOSIS — M6281 Muscle weakness (generalized): Secondary | ICD-10-CM

## 2023-03-21 DIAGNOSIS — G8222 Paraplegia, incomplete: Secondary | ICD-10-CM | POA: Diagnosis not present

## 2023-03-21 DIAGNOSIS — R2681 Unsteadiness on feet: Secondary | ICD-10-CM | POA: Diagnosis not present

## 2023-03-21 DIAGNOSIS — R2689 Other abnormalities of gait and mobility: Secondary | ICD-10-CM
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
## 2023-03-28 ENCOUNTER — Ambulatory Visit: Payer: Medicare PPO | Attending: Orthopedic Surgery | Admitting: Physical Therapy

## 2023-03-28 VITALS — BP 143/61 | HR 65

## 2023-03-28 DIAGNOSIS — Z9181 History of falling: Secondary | ICD-10-CM | POA: Insufficient documentation

## 2023-03-28 DIAGNOSIS — M6281 Muscle weakness (generalized): Secondary | ICD-10-CM | POA: Insufficient documentation

## 2023-03-28 DIAGNOSIS — R2681 Unsteadiness on feet: Secondary | ICD-10-CM | POA: Insufficient documentation

## 2023-03-28 DIAGNOSIS — R2689 Other abnormalities of gait and mobility: Secondary | ICD-10-CM | POA: Insufficient documentation
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
## 2023-03-31 ENCOUNTER — Ambulatory Visit: Payer: Medicare PPO | Admitting: Orthopedic Surgery

## 2023-03-31 ENCOUNTER — Encounter: Payer: Self-pay | Admitting: Orthopedic Surgery

## 2023-03-31 VITALS — BP 130/70 | HR 78 | Temp 97.4°F | Resp 16 | Ht 65.0 in | Wt 106.6 lb

## 2023-03-31 DIAGNOSIS — F0150 Vascular dementia without behavioral disturbance: Secondary | ICD-10-CM | POA: Diagnosis not present

## 2023-03-31 DIAGNOSIS — I10 Essential (primary) hypertension: Secondary | ICD-10-CM | POA: Diagnosis not present

## 2023-03-31 DIAGNOSIS — F0280 Dementia in other diseases classified elsewhere without behavioral disturbance: Secondary | ICD-10-CM | POA: Diagnosis not present

## 2023-03-31 DIAGNOSIS — D692 Other nonthrombocytopenic purpura: Secondary | ICD-10-CM

## 2023-03-31 DIAGNOSIS — G309 Alzheimer's disease, unspecified: Secondary | ICD-10-CM | POA: Diagnosis not present

## 2023-03-31 DIAGNOSIS — S41112A Laceration without foreign body of left upper arm, initial encounter: Secondary | ICD-10-CM | POA: Diagnosis not present

## 2023-03-31 DIAGNOSIS — R2681 Unsteadiness on feet: Secondary | ICD-10-CM

## 2023-03-31 DIAGNOSIS — E782 Mixed hyperlipidemia: Secondary | ICD-10-CM | POA: Diagnosis not present
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
## 2023-03-31 NOTE — Patient Instructions (Addendum)
Continue PT  Please let us know of any behaviors or decline in physical ability  Skin is very fragile due to age- try to apply vaseline in winter months  Purple marks are called senile purpura most likely due to aspirin use  Aspirin is important for stoke prevention   Think about "geri sleeves" for skin protection

## 2023-04-01 ENCOUNTER — Other Ambulatory Visit: Payer: Self-pay | Admitting: Orthopedic Surgery

## 2023-04-01 DIAGNOSIS — N1832 Chronic kidney disease, stage 3b: Secondary | ICD-10-CM

## 2023-04-01 LAB — COMPLETE METABOLIC PANEL WITH GFR
AG Ratio: 1.6 (calc) (ref 1.0–2.5)
ALT: 12 U/L (ref 6–29)
AST: 23 U/L (ref 10–35)
Albumin: 3.9 g/dL (ref 3.6–5.1)
Alkaline phosphatase (APISO): 40 U/L (ref 37–153)
BUN/Creatinine Ratio: 15 (calc) (ref 6–22)
BUN: 25 mg/dL (ref 7–25)
CO2: 31 mmol/L (ref 20–32)
Calcium: 9.2 mg/dL (ref 8.6–10.4)
Chloride: 105 mmol/L (ref 98–110)
Creat: 1.64 mg/dL — ABNORMAL HIGH (ref 0.60–0.95)
Globulin: 2.4 g/dL (ref 1.9–3.7)
Glucose, Bld: 98 mg/dL (ref 65–99)
Potassium: 4.3 mmol/L (ref 3.5–5.3)
Sodium: 144 mmol/L (ref 135–146)
Total Bilirubin: 0.4 mg/dL (ref 0.2–1.2)
Total Protein: 6.3 g/dL (ref 6.1–8.1)
eGFR: 30 mL/min/{1.73_m2} — ABNORMAL LOW (ref >=60)

## 2023-04-01 LAB — CBC WITH DIFFERENTIAL/PLATELET
Absolute Lymphocytes: 1296 {cells}/uL (ref 850–3900)
Absolute Monocytes: 616 {cells}/uL (ref 200–950)
Basophils Absolute: 79 {cells}/uL (ref 0–200)
Basophils Relative: 1 %
Eosinophils Absolute: 150 {cells}/uL (ref 15–500)
Eosinophils Relative: 1.9 %
HCT: 36.5 % (ref 35.0–45.0)
Hemoglobin: 11.8 g/dL (ref 11.7–15.5)
MCH: 31.4 pg (ref 27.0–33.0)
MCHC: 32.3 g/dL (ref 32.0–36.0)
MCV: 97.1 fL (ref 80.0–100.0)
MPV: 12.4 fL (ref 7.5–12.5)
Monocytes Relative: 7.8 %
Neutro Abs: 5759 {cells}/uL (ref 1500–7800)
Neutrophils Relative %: 72.9 %
Platelets: 258 10*3/uL (ref 140–400)
RBC: 3.76 10*6/uL — ABNORMAL LOW (ref 3.80–5.10)
RDW: 12.2 % (ref 11.0–15.0)
Total Lymphocyte: 16.4 %
WBC: 7.9 10*3/uL (ref 3.8–10.8)

## 2023-04-04 ENCOUNTER — Ambulatory Visit: Payer: Medicare PPO

## 2023-04-04 DIAGNOSIS — R2681 Unsteadiness on feet: Secondary | ICD-10-CM

## 2023-04-04 DIAGNOSIS — M6281 Muscle weakness (generalized): Secondary | ICD-10-CM | POA: Diagnosis not present

## 2023-04-04 DIAGNOSIS — Z9181 History of falling: Secondary | ICD-10-CM

## 2023-04-04 DIAGNOSIS — R2689 Other abnormalities of gait and mobility: Secondary | ICD-10-CM | POA: Diagnosis not present
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
## 2023-04-15 ENCOUNTER — Other Ambulatory Visit: Payer: Medicare PPO

## 2023-04-15 ENCOUNTER — Other Ambulatory Visit: Payer: Self-pay

## 2023-04-15 DIAGNOSIS — N1832 Chronic kidney disease, stage 3b: Secondary | ICD-10-CM

## 2023-04-16 LAB — BASIC METABOLIC PANEL WITH GFR
BUN: 18 mg/dL (ref 7–25)
CO2: 31 mmol/L (ref 20–32)
Calcium: 9.2 mg/dL (ref 8.6–10.4)
Chloride: 104 mmol/L (ref 98–110)
Creat: 0.92 mg/dL (ref 0.60–0.95)
Glucose, Bld: 96 mg/dL (ref 65–99)
Potassium: 4 mmol/L (ref 3.5–5.3)
Sodium: 144 mmol/L (ref 135–146)
eGFR: 60 mL/min/{1.73_m2} (ref >=60)

## 2023-04-21 ENCOUNTER — Encounter: Payer: Self-pay | Admitting: Orthopedic Surgery

## 2023-04-21 ENCOUNTER — Ambulatory Visit (INDEPENDENT_AMBULATORY_CARE_PROVIDER_SITE_OTHER): Payer: Medicare PPO | Admitting: Orthopedic Surgery

## 2023-04-21 VITALS — BP 120/68 | HR 82 | Temp 97.3°F | Resp 16 | Ht 65.0 in | Wt 102.0 lb

## 2023-04-21 DIAGNOSIS — N3281 Overactive bladder: Secondary | ICD-10-CM | POA: Diagnosis not present

## 2023-04-21 DIAGNOSIS — R7989 Other specified abnormal findings of blood chemistry: Secondary | ICD-10-CM | POA: Diagnosis not present

## 2023-04-21 DIAGNOSIS — F0280 Dementia in other diseases classified elsewhere without behavioral disturbance: Secondary | ICD-10-CM

## 2023-04-21 DIAGNOSIS — R350 Frequency of micturition: Secondary | ICD-10-CM | POA: Diagnosis not present

## 2023-04-21 DIAGNOSIS — G309 Alzheimer's disease, unspecified: Secondary | ICD-10-CM

## 2023-04-21 DIAGNOSIS — F0150 Vascular dementia without behavioral disturbance: Secondary | ICD-10-CM | POA: Diagnosis not present

## 2023-04-21 MED ORDER — MIRABEGRON ER 25 MG PO TB24: 25.0000 mg | ORAL_TABLET | Freq: Every day | ORAL | 5 refills | Status: DC
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
## 2023-04-21 NOTE — Patient Instructions (Signed)
UA/culture ordered> if normal> recommend increasing Myrbetriq to 50 mg daily  Limit fluids in evening  Avoid carbonated or caffeine drinks before bedtime

## 2023-04-25 ENCOUNTER — Other Ambulatory Visit: Payer: Self-pay | Admitting: Orthopedic Surgery

## 2023-04-25 ENCOUNTER — Encounter: Payer: Self-pay | Admitting: Orthopedic Surgery

## 2023-04-25 DIAGNOSIS — R350 Frequency of micturition: Secondary | ICD-10-CM

## 2023-04-25 DIAGNOSIS — N3281 Overactive bladder: Secondary | ICD-10-CM

## 2023-04-25 NOTE — Telephone Encounter (Signed)
PHARMACY REQUEST   Pharmacy comment: Alternative Requested:CAN YOU CHANGE 50MG  DAILY FOR INSURANCE TO PAY.

## 2023-04-26 ENCOUNTER — Other Ambulatory Visit: Payer: Self-pay | Admitting: Orthopedic Surgery

## 2023-04-26 MED ORDER — MIRABEGRON ER 50 MG PO TB24: 50.0000 mg | ORAL_TABLET | Freq: Every day | ORAL | 5 refills | Status: DC

## 2023-04-26 NOTE — Telephone Encounter (Signed)
 Mast, Man X, NP to Me     04/26/23 10:53 AM Can you help to get the prescription changed to 50mg  every day?  Thank you

## 2023-04-27 ENCOUNTER — Other Ambulatory Visit: Payer: Self-pay | Admitting: Orthopedic Surgery

## 2023-04-27 LAB — URINALYSIS
Bilirubin Urine: NEGATIVE
Glucose, UA: NEGATIVE
Hgb urine dipstick: NEGATIVE
Nitrite: NEGATIVE
Specific Gravity, Urine: 1.016 (ref 1.001–1.035)
pH: 5.5 (ref 5.0–8.0)

## 2023-04-27 LAB — URINE CULTURE
MICRO NUMBER:: 15900780
SPECIMEN QUALITY:: ADEQUATE

## 2023-05-24 ENCOUNTER — Other Ambulatory Visit: Payer: Self-pay | Admitting: Orthopedic Surgery

## 2023-05-27 ENCOUNTER — Encounter: Payer: Self-pay | Admitting: Physician Assistant

## 2023-05-27 ENCOUNTER — Ambulatory Visit: Payer: Medicare PPO | Admitting: Physician Assistant

## 2023-05-27 VITALS — BP 127/68 | HR 77 | Ht 66.0 in | Wt 102.6 lb

## 2023-05-27 DIAGNOSIS — G309 Alzheimer's disease, unspecified: Secondary | ICD-10-CM | POA: Diagnosis not present

## 2023-05-27 DIAGNOSIS — F0280 Dementia in other diseases classified elsewhere without behavioral disturbance: Secondary | ICD-10-CM

## 2023-05-27 DIAGNOSIS — F0150 Vascular dementia without behavioral disturbance: Secondary | ICD-10-CM | POA: Diagnosis not present

## 2023-05-27 NOTE — Patient Instructions (Signed)
It was a pleasure to see you today at our office.   Recommendations:  Continue to control mood as per PCP. Continue donepezil 10 mg daily as per PCP Memantine 5 mg twice a day   Continue B12 supplements  Folllow up  in 6 months   For guidance regarding [facility] and if placement were needed at the facility, contact Sidney Ace, Social Worker tel: [phone]  Whom to call:  Memory  decline, memory medications: Call our office [phone]   For psychiatric meds, mood meds: Please have your primary care physician manage these medications.    For assessment of decision of mental capacity and competency:  Call Dr. Erick Blinks, geriatric psychiatrist at [phone]  For guidance in geriatric dementia issues please call [facility] [phone]  If you have any severe symptoms of a stroke, or other severe issues such as confusion,severe chills or fever, etc call 911 or go to the ER as you may need to be evaluated further       RECOMMENDATIONS FOR ALL PATIENTS WITH MEMORY PROBLEMS: 1. Continue to exercise (Recommend 30 minutes of walking everyday, or 3 hours every week) 2. Increase social interactions - continue going to [place] and enjoy social gatherings with friends and family 3. Eat healthy, avoid fried foods and eat more fruits and vegetables 4. Maintain adequate blood pressure, blood sugar, and blood cholesterol level. Reducing the risk of stroke and cardiovascular disease also helps promoting better memory. 5. Avoid stressful situations. Live a simple life and avoid aggravations. Organize your time and prepare for the next day in anticipation. 6. Sleep well, avoid any interruptions of sleep and avoid any distractions in the bedroom that may interfere with adequate sleep quality 7. Avoid sugar, avoid sweets as there is a strong link between excessive sugar intake, diabetes, and cognitive impairment We discussed the Mediterranean diet, which  has been shown to help patients reduce the risk of progressive memory disorders and reduces cardiovascular risk. This includes eating fish, eat fruits and green leafy vegetables, nuts like almonds and hazelnuts, walnuts, and also use olive oil. Avoid fast foods and fried foods as much as possible. Avoid sweets and sugar as sugar use has been linked to worsening of memory function.  There is always a concern of gradual progression of memory problems. If this is the case, then we may need to adjust level of care according to patient needs. Support, both to the patient and caregiver, should then be put into place.        FALL PRECAUTIONS: Be cautious when walking. Scan the area for obstacles that may increase the risk of trips and falls. When getting up in the mornings, sit up at the edge of the bed for a few minutes before getting out of bed. Consider elevating the bed at the head end to avoid drop of blood pressure when getting up. Walk always in a well-lit room (use night lights in the walls). Avoid area rugs or power cords from appliances in the middle of the walkways. Use a walker or a cane if necessary and consider physical therapy for balance exercise. Get your eyesight checked regularly.  FINANCIAL OVERSIGHT: Supervision, especially oversight when making financial decisions or transactions is also recommended.  HOME SAFETY: Consider the safety of the kitchen when operating appliances like stoves, microwave oven, and blender. Consider having supervision and share cooking responsibilities until no longer able to participate in those. Accidents with firearms and other hazards in the house  should be identified and addressed as well.   ABILITY TO BE LEFT ALONE: If patient is unable to contact 911 operator, consider using LifeLine, or when the need is there, arrange for someone to stay with patients. Smoking is a fire hazard, consider supervision or cessation. Risk of wandering should be assessed by  caregiver and if detected at any point, supervision and safe proof recommendations should be instituted.  MEDICATION SUPERVISION: Inability to self-administer medication needs to be constantly addressed. Implement a mechanism to ensure safe administration of the medications.   DRIVING: Regarding driving, in patients with progressive memory problems, driving will be impaired. We advise to have someone else do the driving if trouble finding directions or if minor accidents are reported. Independent driving assessment is available to determine safety of driving.   If you are interested in the driving assessment, you can contact the following:  [facility] [employer] [facility] in [place] [phone]  [facility] [phone]  [facility] [phone] or [phone]    Mediterranean Diet A Mediterranean diet refers to food and lifestyle choices that are based on the traditions of countries located on the [employer]. This way of eating has been shown to help prevent certain conditions and improve outcomes for people who have chronic diseases, like kidney disease and heart disease. What are tips for following this plan? Lifestyle  Cook and eat meals together with your family, when possible. Drink enough fluid to keep your urine clear or pale yellow. Be physically active every day. This includes: Aerobic exercise like running or swimming. Leisure activities like gardening, walking, or housework. Get 7-8 hours of sleep each night. If recommended by your health care provider, drink red wine in moderation. This means 1 glass a day for nonpregnant women and 2 glasses a day for men. A glass of wine equals 5 oz (150 mL). Reading food labels  Check the serving size of packaged foods. For foods such as rice and pasta, the serving size refers to the amount of cooked product, not dry. Check the total fat in packaged foods. Avoid foods  that have saturated fat or trans fats. Check the ingredients list for added sugars, such as corn syrup. Shopping  At the grocery store, buy most of your food from the areas near the walls of the store. This includes: Fresh fruits and vegetables (produce). Grains, beans, nuts, and seeds. Some of these may be available in unpackaged forms or large amounts (in bulk). Fresh seafood. Poultry and eggs. Low-fat dairy products. Buy whole ingredients instead of prepackaged foods. Buy fresh fruits and vegetables in-season from local farmers markets. Buy frozen fruits and vegetables in resealable bags. If you do not have access to quality fresh seafood, buy precooked frozen shrimp or canned fish, such as tuna, [place], or sardines. Buy small amounts of raw or cooked vegetables, salads, or olives from the deli or salad bar at your store. Stock your pantry so you always have certain foods on hand, such as olive oil, canned tuna, canned tomatoes, rice, pasta, and beans. Cooking  Cook foods with extra-virgin olive oil instead of using butter or other vegetable oils. Have meat as a side dish, and have vegetables or grains as your main dish. This means having meat in small portions or adding small amounts of meat to foods like pasta or stew. Use beans or vegetables instead of meat in common dishes like chili or lasagna. Experiment with different cooking methods. Try roasting or broiling vegetables  instead of steaming or sauteing them. Add frozen vegetables to soups, stews, pasta, or rice. Add nuts or seeds for added healthy fat at each meal. You can add these to yogurt, salads, or vegetable dishes. Marinate fish or vegetables using olive oil, lemon juice, garlic, and fresh herbs. Meal planning  Plan to eat 1 vegetarian meal one day each week. Try to work up to 2 vegetarian meals, if possible. Eat seafood 2 or more times a week. Have healthy snacks readily available, such as: Vegetable sticks with  hummus. Greek yogurt. Fruit and nut trail mix. Eat balanced meals throughout the week. This includes: Fruit: 2-3 servings a day Vegetables: 4-5 servings a day Low-fat dairy: 2 servings a day Fish, poultry, or lean meat: 1 serving a day Beans and legumes: 2 or more servings a week Nuts and seeds: 1-2 servings a day Whole grains: 6-8 servings a day Extra-virgin olive oil: 3-4 servings a day Limit red meat and sweets to only a few servings a month What are my food choices? Mediterranean diet Recommended Grains: Whole-grain pasta. Brown rice. Bulgar wheat. Polenta. Couscous. Whole-wheat bread. Orpah Cobb. Vegetables: Artichokes. Beets. Broccoli. Cabbage. Carrots. Eggplant. Green beans. Chard. Kale. Spinach. Onions. Leeks. Peas. Squash. Tomatoes. Peppers. Radishes. Fruits: Apples. Apricots. Avocado. Berries. Bananas. Cherries. Dates. Figs. Grapes. Lemons. Melon. Oranges. Peaches. Plums. Pomegranate. Meats and other protein foods: Beans. Almonds. Sunflower seeds. Pine nuts. Peanuts. Cod. [place]. Scallops. Shrimp. Tuna. Tilapia. Clams. Oysters. Eggs. Dairy: Low-fat milk. Cheese. Greek yogurt. Beverages: Water. Red wine. Herbal tea. Fats and oils: Extra virgin olive oil. Avocado oil. Grape seed oil. Sweets and desserts: [place] yogurt with honey. Baked apples. Poached pears. Trail mix. Seasoning and other foods: Basil. Cilantro. Coriander. Cumin. Mint. Parsley. Sage. Rosemary. Tarragon. Garlic. Oregano. Thyme. Pepper. Balsalmic vinegar. Tahini. Hummus. Tomato sauce. Olives. Mushrooms. Limit these Grains: Prepackaged pasta or rice dishes. Prepackaged cereal with added sugar. Vegetables: Deep fried potatoes (french fries). Fruits: Fruit canned in syrup. Meats and other protein foods: Beef. Pork. Lamb. Poultry with skin. Hot dogs. Tomasa Blase. Dairy: Ice cream. Sour cream. Whole milk. Beverages: Juice. Sugar-sweetened soft drinks. Beer. Liquor and spirits. Fats and oils: Butter. Canola oil.  Vegetable oil. Beef fat (tallow). Lard. Sweets and desserts: Cookies. Cakes. Pies. Candy. Seasoning and other foods: Mayonnaise. Premade sauces and marinades. The items listed may not be a complete list. Talk with your dietitian about what dietary choices are right for you. Summary The Mediterranean diet includes both food and lifestyle choices. Eat a variety of fresh fruits and vegetables, beans, nuts, seeds, and whole grains. Limit the amount of red meat and sweets that you eat. Talk with your health care provider about whether it is safe for you to drink red wine in moderation. This means 1 glass a day for nonpregnant women and 2 glasses a day for men. A glass of wine equals 5 oz (150 mL). This information is not intended to replace advice given to you by your health care provider. Make sure you discuss any questions you have with your health care provider. Document Released: 12/04/2015 Document Revised: 01/06/2016 Document Reviewed: 12/04/2015 Elsevier Interactive Patient Education  2017 [employer].

## 2023-05-27 NOTE — Progress Notes (Signed)
Assessment/Plan:   Dementia likely due to Alzheimer's disease and vascular etiology  Cindy Davies is a very pleasant 89 y.o. 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 Patient is currently on donepezil 10 mg daily and memantine 5 mg twice daily, tolerating well. Discussed with her son regarding Adult day programs for social and cognitive stimulation     Follow up in 6  months. Continue donepezil 10 mg daily, memantine 5 mg twice daily, side effects discussed Recommend good control of her cardiovascular risk factors Continue B12 supplements Continue PT for strength and balance. Continue to control mood as per PCP Continue the use of hearing aids to improve comprehension, follow-up with audiology   Subjective:    This patient is accompanied in the office by her son  who supplements the history.  Previous records as well as any outside records available were reviewed prior to todays visit. Patient was last seen on 11/24/2022, with MMSE 27/30    Any changes in memory since last visit? ".  Sometimes she has difficulty remembering recent conversations, but able to remember names of people.  She likes doing word search, watching TV.  LTM is good. repeats oneself?  Endorsed "she always did". Disoriented when walking into a room?  Patient denies   Leaving objects?  May misplace things but not in unusual places  Wandering behavior?  denies   Any personality changes since last visit?  denies   Any worsening depression?:  Denies.   Hallucinations or paranoia?  Denies.   Seizures? denies    Any sleep changes?  Sleeps too much, he has to force her to get out of the bed  Denies vivid dreams, REM behavior or sleepwalking   Sleep apnea?    Denies.   Any hygiene concerns? Denies.  Independent of bathing and dressing?  Endorsed  Does the patient needs help with medications?  Son states the pillbox weekly.  Who is in charge of the finances?  Son is in charge    Any changes in appetite? Decreased.   "She always likes sweets ".   Patient have trouble swallowing? Denies.   Does the patient cook?  Yes, denies any issues. Any headaches?   denies   Chronic back pain  denies   Ambulates with difficulty?  Since 1971, the patient has difficulty walking, after an accident, uses a walker for stability. Does not want to participate on her PT.  Recent falls or head injuries?  On October 2024 the patient sustained a mechanical injury, head trauma as she was getting something out of the carport and a heavy pot fell and hit her head.  She denies LOC.  At the ED a CT of the head was performed, negative for acute findings. Unilateral weakness, numbness or tingling?  She has chronic right lower extremity paresthesia since 1971 Any tremors?  Denies   Any anosmia?  Denies   Any incontinence of urine?  Endorsed, wears diapers Any bowel dysfunction?   Denies      Patient lives with her son Harvie Heck Does the patient drive? No longer drives     MRI brain December 2022 was reviewed, showing mild generalized age-related volume loss, without evidence of advanced atrophy or lobar predominance.  mild chronic small vessel ischemic changes and small old cortical  infarctions in the anterior lateral right temporal lobe and inferior left occipital lobe, asymptomatic       Initial Visit 10/2021 How long did patient have memory difficulties? For about 3 years, worse since 2024-03-18 after her husband death, STM < LTM.  Sometimes she forgets names and dates.  She is severely hard of hearing, so several times, "she will make her own answer because she cannot hear well ".  Her son reports that she was "never very bright ".  She does enjoy doing crossword puzzles. Patient  lives with: Her son repeats oneself? Denies  Disoriented when walking into a room?  Patient denies   Leaving objects in unusual places?  Denies  Ambulates  with difficulty?  Not as mobile as before, she is weaker. No balance issues.  She uses a  R cane to ambulate. Recent falls?  Patient denies   Any head injuries?  Patient denies   History of seizures?   Patient denies   Wandering behavior?  Patient denies   Patient drives?   Patient no longer drives   Any mood changes ? Residual depression from her husband dying last 03-18-24.  She did have grief counseling.  She is also on Paxil, her son is entertaining changing days, as he is concerned that it may be affecting her memory.  He is to discuss with PCP-he just changed PCPs to a geriatric one. Hallucinations?  Patient denies   Paranoia? Denies .  Patient reports that she sleeps an average of  10- 12 hours a day, without vivid dreams, REM behavior or sleepwalking   History of sleep apnea?  Patient denies   Any hygiene concerns?  Patient denies   Independent of bathing and dressing?  Endorsed  Does the patient needs help with medications? Son is in charge, puts them in a pill Dance movement psychotherapist. Who is in charge of the finances?  Patient is in charge     Any changes in appetite?   Denies. Decreased water intake, but likes coffee, "but I bought the large 64 ounces tumbler with a straw and that helps to drink more water " Patient have trouble swallowing? Patient denies   Does the patient cook?  Patient denies   Any kitchen accidents such as leaving the stove on? Patient denies   Any headaches?  Patient denies   Double vision? Patient denies   Any focal numbness or tingling?  She has chronic leg paresthesia due to a neck injury in 1971.  No new symptoms of stroke. Chronic back pain Patient denies   Unilateral weakness?  R side is weaker after neck injury 1971.  Any tremors?  Patient denies   Any history of anosmia?  Patient denies   Any incontinence  of urine?  History of urge incontinence, OAB controlled with Myrbetriq Any bowel dysfunction?   Patient denies   History of heavy alcohol intake?  Patient denies   History of heavy tobacco use?  Patient denies   Family history of dementia?  Patient denies PREVIOUS MEDICATIONS:   CURRENT MEDICATIONS:  Outpatient Encounter Medications as of 05/27/2023  Medication Sig   acetaminophen (TYLENOL) 325 MG tablet Take 650 mg by mouth every 6 (six) hours as needed.   amLODipine (NORVASC) 5 MG tablet Take 1 tablet (5 mg total) by mouth daily.   aspirin EC 81 MG tablet Take 1 tablet (81 mg total) by mouth daily. Swallow whole.   Calcium Carbonate-Vit D-Min (CALTRATE 600+D PLUS MINERALS) 600-800 MG-UNIT CHEW Chew by mouth.  Cholecalciferol (D3 2000 PO) Take 1 tablet by mouth daily.   Cyanocobalamin (VITAMIN B 12 PO) Take 250 mg by mouth daily.   donepezil (ARICEPT) 10 MG tablet TAKE 1 TABLET BY MOUTH EVERYDAY AT BEDTIME   MAGNESIUM GLUCONATE PO Take 1 tablet by mouth daily.   memantine (NAMENDA) 5 MG tablet TAKE 1 TABLET BY MOUTH TWICE A DAY   Menaquinone-7 (K2 PO) Take 1 tablet by mouth daily.   mirabegron ER (MYRBETRIQ) 50 MG TB24 tablet Take 1 tablet (50 mg total) by mouth daily.   Multiple Vitamins-Minerals (CENTRUM SILVER ADULT 50+ PO) Take 1 tablet by mouth daily.   polyethylene glycol (MIRALAX / GLYCOLAX) packet Take 17 g by mouth as needed.   Probiotic Product (PROBIOTIC PO) Take 1 capsule by mouth every other day.   simvastatin (ZOCOR) 40 MG tablet TAKE 1 TABLET BY MOUTH EVERYDAY AT BEDTIME   No facility-administered encounter medications on file as of 05/27/2023.       11/25/2022    7:00 AM 05/26/2022    5:00 PM 12/20/2017    8:56 AM  MMSE - Mini Mental State Exam  Orientation to time 5 1 5   Orientation to Place 4 4 5   Registration 3 3 3   Attention/ Calculation 5 4 0  Recall 1 0 3  Language- name 2 objects 2 2 0  Language- repeat 1 1 1   Language- follow 3 step command 3 3 3    Language- read & follow direction 1 1 0  Write a sentence 1 1 0  Copy design 1 1 0  Total score 27 21 20       11/23/2021    9:00 AM  Montreal Cognitive Assessment   Visuospatial/ Executive (0/5) 1  Naming (0/3) 2  Attention: Read list of digits (0/2) 2  Attention: Read list of letters (0/1) 1  Attention: Serial 7 subtraction starting at 100 (0/3) 1  Language: Repeat phrase (0/2) 1  Language : Fluency (0/1) 1  Abstraction (0/2) 2  Delayed Recall (0/5) 0  Orientation (0/6) 3  Total 14  Adjusted Score (based on education) 14    Objective:     PHYSICAL EXAMINATION:    VITALS:  There were no vitals filed for this visit.  GEN:  The patient appears stated age and is in NAD. HEENT:  Normocephalic, atraumatic.   Neurological examination:  General: NAD, well-groomed, appears stated age. Orientation: The patient is alert. Oriented to person, place and date Cranial nerves: There is good facial symmetry.The speech is fluent and clear. No aphasia or dysarthria. Fund of knowledge is appropriate. Recent and remote memory are impaired. Attention and concentration are normal.  Able to name objects and repeat phrases.  Hearing is very decreased  to conversational tone, despite using a device.  She cannot hear from the right chronically*** Sensation: Sensation is intact to light touch throughout Motor: Strength is at least antigravity x4.  Right side is weaker after neck injury.  She uses a right cane. DTR's 2/4 in UE/LE     Movement examination: Tone: There is normal tone in the UE/LE Abnormal movements:  no tremor.  No myoclonus.  No asterixis.   Coordination:  There is no decremation with RAM's. Normal finger to nose  Gait and Station: The patient is in a wheelchair today, gait unable to be tested.    Thank you for allowing [place] the opportunity to participate in the care of this nice patient. Please do not hesitate to contact us for any  questions or concerns.   Total time spent on  today's visit was *** minutes dedicated to this patient today, preparing to see patient, examining the patient, ordering tests and/or medications and counseling the patient, documenting clinical information in the EHR or other health record, independently interpreting results and communicating results to the patient/family, discussing treatment and goals, answering patient's questions and coordinating care.  Cc:  Octavia Heir, NP  Marlowe Kays 05/27/2023 6:57 AM

## 2023-06-16 ENCOUNTER — Other Ambulatory Visit: Payer: Self-pay | Admitting: Orthopedic Surgery

## 2023-07-04 ENCOUNTER — Other Ambulatory Visit: Payer: Self-pay | Admitting: Orthopedic Surgery

## 2023-07-04 DIAGNOSIS — I10 Essential (primary) hypertension: Secondary | ICD-10-CM

## 2023-08-04 ENCOUNTER — Ambulatory Visit: Admitting: Orthopedic Surgery

## 2023-08-04 VITALS — BP 128/60 | HR 77 | Temp 97.0°F | Resp 16 | Ht 66.0 in | Wt 96.2 lb

## 2023-08-04 DIAGNOSIS — N3281 Overactive bladder: Secondary | ICD-10-CM

## 2023-08-04 DIAGNOSIS — F0150 Vascular dementia without behavioral disturbance: Secondary | ICD-10-CM

## 2023-08-04 DIAGNOSIS — F0280 Dementia in other diseases classified elsewhere without behavioral disturbance: Secondary | ICD-10-CM

## 2023-08-04 DIAGNOSIS — R634 Abnormal weight loss: Secondary | ICD-10-CM | POA: Diagnosis not present

## 2023-08-04 DIAGNOSIS — E876 Hypokalemia: Secondary | ICD-10-CM | POA: Diagnosis not present

## 2023-08-04 DIAGNOSIS — G309 Alzheimer's disease, unspecified: Secondary | ICD-10-CM | POA: Diagnosis not present

## 2023-08-04 DIAGNOSIS — E440 Moderate protein-calorie malnutrition: Secondary | ICD-10-CM | POA: Diagnosis not present

## 2023-08-04 MED ORDER — MEMANTINE HCL 10 MG PO TABS: 10.0000 mg | ORAL_TABLET | Freq: Two times a day (BID) | ORAL | 1 refills | Status: DC
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
## 2023-08-04 NOTE — Patient Instructions (Addendum)
 [facility] [phone] ask about respite  Increase Namenda to 10 mg twice daily  Try to promote calories  Consider drug holiday with Myrbetriq> count soiled briefs> if no change stop medication

## 2023-08-05 ENCOUNTER — Encounter: Payer: Self-pay | Admitting: Orthopedic Surgery

## 2023-08-05 LAB — CBC WITH DIFFERENTIAL/PLATELET
Absolute Lymphocytes: 1234 {cells}/uL (ref 850–3900)
Absolute Monocytes: 416 {cells}/uL (ref 200–950)
Basophils Absolute: 53 {cells}/uL (ref 0–200)
Basophils Relative: 0.8 %
Eosinophils Absolute: 79 {cells}/uL (ref 15–500)
Eosinophils Relative: 1.2 %
HCT: 39.8 % (ref 35.0–45.0)
Hemoglobin: 13.1 g/dL (ref 11.7–15.5)
MCH: 31.5 pg (ref 27.0–33.0)
MCHC: 32.9 g/dL (ref 32.0–36.0)
MCV: 95.7 fL (ref 80.0–100.0)
MPV: 12.2 fL (ref 7.5–12.5)
Monocytes Relative: 6.3 %
Neutro Abs: 4818 {cells}/uL (ref 1500–7800)
Neutrophils Relative %: 73 %
Platelets: 265 10*3/uL (ref 140–400)
RBC: 4.16 10*6/uL (ref 3.80–5.10)
RDW: 12.8 % (ref 11.0–15.0)
Total Lymphocyte: 18.7 %
WBC: 6.6 10*3/uL (ref 3.8–10.8)

## 2023-08-05 LAB — COMPLETE METABOLIC PANEL WITHOUT GFR
AG Ratio: 1.7 (calc) (ref 1.0–2.5)
ALT: 10 U/L (ref 6–29)
AST: 23 U/L (ref 10–35)
Albumin: 4 g/dL (ref 3.6–5.1)
Alkaline phosphatase (APISO): 38 U/L (ref 37–153)
BUN: 24 mg/dL (ref 7–25)
CO2: 31 mmol/L (ref 20–32)
Calcium: 9.3 mg/dL (ref 8.6–10.4)
Chloride: 102 mmol/L (ref 98–110)
Creat: 0.85 mg/dL (ref 0.60–0.95)
Globulin: 2.4 g/dL (ref 1.9–3.7)
Glucose, Bld: 163 mg/dL — ABNORMAL HIGH (ref 65–139)
Potassium: 3.2 mmol/L — ABNORMAL LOW (ref 3.5–5.3)
Sodium: 142 mmol/L (ref 135–146)
Total Bilirubin: 0.5 mg/dL (ref 0.2–1.2)
Total Protein: 6.4 g/dL (ref 6.1–8.1)

## 2023-08-05 LAB — TSH: TSH: 2.93 m[IU]/L (ref 0.40–4.50)

## 2023-08-05 MED ORDER — POTASSIUM CHLORIDE CRYS ER 20 MEQ PO TBCR: 20.0000 meq | EXTENDED_RELEASE_TABLET | Freq: Two times a day (BID) | ORAL | 0 refills | Status: DC

## 2023-08-05 NOTE — Addendum Note (Signed)
 Addended byHazle Nordmann E on: 08/05/2023 08:49 AM   Modules accepted: Orders

## 2023-08-19 ENCOUNTER — Other Ambulatory Visit

## 2023-08-19 DIAGNOSIS — E876 Hypokalemia: Secondary | ICD-10-CM | POA: Diagnosis not present

## 2023-08-20 LAB — BASIC METABOLIC PANEL WITHOUT GFR
BUN/Creatinine Ratio: 27 (calc) — ABNORMAL HIGH (ref 6–22)
BUN: 26 mg/dL — ABNORMAL HIGH (ref 7–25)
CO2: 30 mmol/L (ref 20–32)
Calcium: 9.3 mg/dL (ref 8.6–10.4)
Chloride: 105 mmol/L (ref 98–110)
Creat: 0.96 mg/dL — ABNORMAL HIGH (ref 0.60–0.95)
Glucose, Bld: 94 mg/dL (ref 65–139)
Potassium: 4.4 mmol/L (ref 3.5–5.3)
Sodium: 143 mmol/L (ref 135–146)

## 2023-08-22 ENCOUNTER — Encounter: Payer: Self-pay | Admitting: Orthopedic Surgery

## 2023-10-06 ENCOUNTER — Encounter: Payer: Medicare PPO | Admitting: Orthopedic Surgery

## 2023-10-07 NOTE — Progress Notes (Signed)
 This encounter was created in error - please disregard.

## 2023-10-09 ENCOUNTER — Other Ambulatory Visit: Payer: Self-pay | Admitting: Orthopedic Surgery

## 2023-10-13 ENCOUNTER — Encounter: Payer: Self-pay | Admitting: Physician Assistant

## 2023-10-13 ENCOUNTER — Ambulatory Visit: Admitting: Physician Assistant

## 2023-10-13 VITALS — Resp 20 | Wt 99.0 lb

## 2023-10-13 DIAGNOSIS — F0280 Dementia in other diseases classified elsewhere without behavioral disturbance: Secondary | ICD-10-CM

## 2023-10-13 DIAGNOSIS — F0150 Vascular dementia without behavioral disturbance: Secondary | ICD-10-CM | POA: Diagnosis not present

## 2023-10-13 DIAGNOSIS — G309 Alzheimer's disease, unspecified: Secondary | ICD-10-CM

## 2023-10-13 NOTE — Patient Instructions (Addendum)
 It was a pleasure to see you today at our office.   Recommendations:  Continue to control mood as per PCP. Continue donepezil  10 mg daily as per PCP, consider increasing to 23 mg daily Continue memantine  10 mg twice a day   Continue B12 supplements  Folllow up  in 6 months   For guidance regarding [facility] and if placement were needed at the facility, contact Forrestine Ike, Social Worker tel: [phone]  Whom to call:  Memory  decline, memory medications: Call our office [phone]   For psychiatric meds, mood meds: Please have your primary care physician manage these medications.    For assessment of decision of mental capacity and competency:  Call Dr. Laverne Potter, geriatric psychiatrist at [phone]  For guidance in geriatric dementia issues please call [facility] [phone]  If you have any severe symptoms of a stroke, or other severe issues such as confusion,severe chills or fever, etc call 911 or go to the ER as you may need to be evaluated further       RECOMMENDATIONS FOR ALL PATIENTS WITH MEMORY PROBLEMS: 1. Continue to exercise (Recommend 30 minutes of walking everyday, or 3 hours every week) 2. Increase social interactions - continue going to [place] and enjoy social gatherings with friends and family 3. Eat healthy, avoid fried foods and eat more fruits and vegetables 4. Maintain adequate blood pressure, blood sugar, and blood cholesterol level. Reducing the risk of stroke and cardiovascular disease also helps promoting better memory. 5. Avoid stressful situations. Live a simple life and avoid aggravations. Organize your time and prepare for the next day in anticipation. 6. Sleep well, avoid any interruptions of sleep and avoid any distractions in the bedroom that may interfere with adequate sleep quality 7. Avoid sugar, avoid sweets as there is a strong link between excessive sugar intake, diabetes, and cognitive  impairment We discussed the Mediterranean diet, which has been shown to help patients reduce the risk of progressive memory disorders and reduces cardiovascular risk. This includes eating fish, eat fruits and green leafy vegetables, nuts like almonds and hazelnuts, walnuts, and also use olive oil. Avoid fast foods and fried foods as much as possible. Avoid sweets and sugar as sugar use has been linked to worsening of memory function.  There is always a concern of gradual progression of memory problems. If this is the case, then we may need to adjust level of care according to patient needs. Support, both to the patient and caregiver, should then be put into place.        FALL PRECAUTIONS: Be cautious when walking. Scan the area for obstacles that may increase the risk of trips and falls. When getting up in the mornings, sit up at the edge of the bed for a few minutes before getting out of bed. Consider elevating the bed at the head end to avoid drop of blood pressure when getting up. Walk always in a well-lit room (use night lights in the walls). Avoid area rugs or power cords from appliances in the middle of the walkways. Use a walker or a cane if necessary and consider physical therapy for balance exercise. Get your eyesight checked regularly.  FINANCIAL OVERSIGHT: Supervision, especially oversight when making financial decisions or transactions is also recommended.  HOME SAFETY: Consider the safety of the kitchen when operating appliances like stoves, microwave oven, and blender. Consider having supervision and share cooking responsibilities until no longer able to participate in those. Accidents with  firearms and other hazards in the house should be identified and addressed as well.   ABILITY TO BE LEFT ALONE: If patient is unable to contact 911 operator, consider using LifeLine, or when the need is there, arrange for someone to stay with patients. Smoking is a fire hazard, consider supervision  or cessation. Risk of wandering should be assessed by caregiver and if detected at any point, supervision and safe proof recommendations should be instituted.  MEDICATION SUPERVISION: Inability to self-administer medication needs to be constantly addressed. Implement a mechanism to ensure safe administration of the medications.   DRIVING: Regarding driving, in patients with progressive memory problems, driving will be impaired. We advise to have someone else do the driving if trouble finding directions or if minor accidents are reported. Independent driving assessment is available to determine safety of driving.   If you are interested in the driving assessment, you can contact the following:  [employer] in [place] [phone]  [facility] [phone]  [facility] [phone] or [phone]    Mediterranean Diet A Mediterranean diet refers to food and lifestyle choices that are based on the traditions of countries located on the [employer] [place]. This way of eating has been shown to help prevent certain conditions and improve outcomes for people who have chronic diseases, like kidney disease and heart disease. What are tips for following this plan? Lifestyle  Cook and eat meals together with your family, when possible. Drink enough fluid to keep your urine clear or pale yellow. Be physically active every day. This includes: Aerobic exercise like running or swimming. Leisure activities like gardening, walking, or housework. Get 7-8 hours of sleep each night. If recommended by your health care provider, drink red wine in moderation. This means 1 glass a day for nonpregnant women and 2 glasses a day for men. A glass of wine equals 5 oz (150 mL). Reading food labels  Check the serving size of packaged foods. For foods such as rice and pasta, the serving size refers to the amount of cooked product, not  dry. Check the total fat in packaged foods. Avoid foods that have saturated fat or trans fats. Check the ingredients list for added sugars, such as corn syrup. Shopping  At the grocery store, buy most of your food from the areas near the walls of the store. This includes: Fresh fruits and vegetables (produce). Grains, beans, nuts, and seeds. Some of these may be available in unpackaged forms or large amounts (in bulk). Fresh seafood. Poultry and eggs. Low-fat dairy products. Buy whole ingredients instead of prepackaged foods. Buy fresh fruits and vegetables in-season from local farmers markets. Buy frozen fruits and vegetables in resealable bags. If you do not have access to quality fresh seafood, buy precooked frozen shrimp or canned fish, such as tuna, [place], or sardines. Buy small amounts of raw or cooked vegetables, salads, or olives from the deli or salad bar at your store. Stock your pantry so you always have certain foods on hand, such as olive oil, canned tuna, canned tomatoes, rice, pasta, and beans. Cooking  Cook foods with extra-virgin olive oil instead of using butter or other vegetable oils. Have meat as a side dish, and have vegetables or grains as your main dish. This means having meat in small portions or adding small amounts of meat to foods like pasta or stew. Use beans or vegetables instead of meat in common dishes like chili or lasagna. Experiment with different  cooking methods. Try roasting or broiling vegetables instead of steaming or sauteing them. Add frozen vegetables to soups, stews, pasta, or rice. Add nuts or seeds for added healthy fat at each meal. You can add these to yogurt, salads, or vegetable dishes. Marinate fish or vegetables using olive oil, lemon juice, garlic, and fresh herbs. Meal planning  Plan to eat 1 vegetarian meal one day each week. Try to work up to 2 vegetarian meals, if possible. Eat seafood 2 or more times a week. Have healthy snacks  readily available, such as: Vegetable sticks with hummus. Greek yogurt. Fruit and nut trail mix. Eat balanced meals throughout the week. This includes: Fruit: 2-3 servings a day Vegetables: 4-5 servings a day Low-fat dairy: 2 servings a day Fish, poultry, or lean meat: 1 serving a day Beans and legumes: 2 or more servings a week Nuts and seeds: 1-2 servings a day Whole grains: 6-8 servings a day Extra-virgin olive oil: 3-4 servings a day Limit red meat and sweets to only a few servings a month What are my food choices? Mediterranean diet Recommended Grains: Whole-grain pasta. Brown rice. Bulgar wheat. Polenta. Couscous. Whole-wheat bread. Dwyane Glad. Vegetables: Artichokes. Beets. Broccoli. Cabbage. Carrots. Eggplant. Green beans. Chard. Kale. Spinach. Onions. Leeks. Peas. Squash. Tomatoes. Peppers. Radishes. Fruits: Apples. Apricots. Avocado. Berries. Bananas. Cherries. Dates. Figs. Grapes. Lemons. Melon. Oranges. Peaches. Plums. Pomegranate. Meats and other protein foods: Beans. Almonds. Sunflower seeds. Pine nuts. Peanuts. Cod. [place]. Scallops. Shrimp. Tuna. Tilapia. Clams. Oysters. Eggs. Dairy: Low-fat milk. Cheese. Greek yogurt. Beverages: Water. Red wine. Herbal tea. Fats and oils: Extra virgin olive oil. Avocado oil. Grape seed oil. Sweets and desserts: [place] yogurt with honey. Baked apples. Poached pears. Trail mix. Seasoning and other foods: Basil. Cilantro. Coriander. Cumin. Mint. Parsley. Sage. Rosemary. Tarragon. Garlic. Oregano. Thyme. Pepper. Balsalmic vinegar. Tahini. Hummus. Tomato sauce. Olives. Mushrooms. Limit these Grains: Prepackaged pasta or rice dishes. Prepackaged cereal with added sugar. Vegetables: Deep fried potatoes (french fries). Fruits: Fruit canned in syrup. Meats and other protein foods: Beef. Pork. Lamb. Poultry with skin. Hot dogs. Helene Loader. Dairy: Ice cream. Sour cream. Whole milk. Beverages: Juice. Sugar-sweetened soft drinks. Beer. Liquor and  spirits. Fats and oils: Butter. Canola oil. Vegetable oil. Beef fat (tallow). Lard. Sweets and desserts: Cookies. Cakes. Pies. Candy. Seasoning and other foods: Mayonnaise. Premade sauces and marinades. The items listed may not be a complete list. Talk with your dietitian about what dietary choices are right for you. Summary The Mediterranean diet includes both food and lifestyle choices. Eat a variety of fresh fruits and vegetables, beans, nuts, seeds, and whole grains. Limit the amount of red meat and sweets that you eat. Talk with your health care provider about whether it is safe for you to drink red wine in moderation. This means 1 glass a day for nonpregnant women and 2 glasses a day for men. A glass of wine equals 5 oz (150 mL). This information is not intended to replace advice given to you by your health care provider. Make sure you discuss any questions you have with your health care provider. Document Released: 12/04/2015 Document Revised: 01/06/2016 Document Reviewed: 12/04/2015 Elsevier Interactive Patient Education  2017 [employer].

## 2023-10-13 NOTE — Progress Notes (Signed)
 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 RH female with a history of hypertension, hyperlipidemia, endometrial cancer s/p chemo XRT, chronic anemia, chronic leg paresthesia due to prior spinal cord injury in 1971,arthritis, CKD, severe hearing loss, OAB, subclinical hypothyroidism, situational depression seen today in follow up for memory loss. Patient is currently on donepezil  10 mg daily and memantine  10 mg twice daily managed by PCP.  Mild cognitive decline noted today.  Discussed with her son regarding adult day programs for social and cognitive stimulation, but son may be looking into assisted living for safety as well. She needs more assistance with ADLS than before.      Follow up in  6 months. Continue donepezil  10 mg daily, son to consider increasing to 23 mg daily, will contact us ,  continue memantine  10 mg twice daily by PCP, side effects discussed  Recommend good control of her cardiovascular risk factors Continue B12 supplementation Continue PT for strength and balance Continue to use hearing aids to improve comprehension, follow-up with audiology Continue to control mood as per PCP       Subjective:    This patient is accompanied in the office by her son who supplements the history.  Previous records as well as any outside records available were reviewed prior to todays visit. Patient was last seen on 05/27/23    Any changes in memory since last visit?  Is worse, especially comprehension  son says.  She has more difficulty with short-term memory although long-term memory is affected as well.  She still likes doing word search, watching TV. Repeats oneself?  Endorsed, she obviously -son says Disoriented when walking into a room? Denies    Leaving objects?  May misplace things but not in unusual places   Wandering behavior?  denies   Any personality changes since last visit? She is  more aggravated when told what to do, like telling her to put her socks and I have to end up doing it, not sure if she is doing it purposely or not.   Any worsening depression?:  Denies.   Hallucinations or paranoia?  There were a couple of episodes in which she talks to herself but has not been agitated.. Seizures? denies    Any sleep changes?  Sleeps too much -son says.  Denies vivid dreams, REM behavior or sleepwalking   Sleep apnea?   Denies.   Any hygiene concerns? Denies.  Independent of bathing and dressing?  Endorsed  Does the patient needs help with medications?  Son is in charge   Who is in charge of the finances?  Son is in charge     Any changes in appetite?  Decreased, lost10 pounds over the last few months.    Patient have trouble swallowing? Denies.   Does the patient cook? No  Any headaches?   denies   Any vision changes? no Chronic back pain  denies   Ambulates with difficulty?  Since 1971, the patient has difficulty walking after an accident, uses a walker for stability.  Recent falls or head injuries? Denies.     Unilateral weakness, numbness or tingling?  She has chronic right lower extremity paresthesias since 1971 Any tremors?  Denies    Any anosmia?  Denies   Any incontinence of urine?  Endorsed, despite using Myrbetriq , mostly overnight.   Any bowel dysfunction?   Denies      Patient lives  with her son Cindy Davies  Does the patient drive? No longer drives     MRI brain December 2022 was reviewed, showing mild generalized age-related volume loss, without evidence of advanced atrophy or lobar predominance.  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 For about 3 years, worse since 04-15-2024 after her husband death, STM < LTM.  Sometimes she forgets names and dates.  She is  severely hard of hearing, so several times, she will make her own answer because she cannot hear well .  Her son reports that she was never very bright .  She does enjoy doing crossword puzzles. Patient lives with: Her son repeats oneself? Denies  Disoriented when walking into a room?  Patient denies   Leaving objects in unusual places?  Denies  Ambulates  with difficulty?  Not as mobile as before, she is weaker. No balance issues.  She uses a  R cane to ambulate. Recent falls?  Patient denies   Any head injuries?  Patient denies   History of seizures?   Patient denies   Wandering behavior?  Patient denies   Patient drives?   Patient no longer drives   Any mood changes ? Residual depression from her husband dying last 2024-04-15.  She did have grief counseling.  She is also on Paxil , her son is entertaining changing days, as he is concerned that it may be affecting her memory.  He is to discuss with PCP-he just changed PCPs to a geriatric one. Hallucinations?  Patient denies   Paranoia? Denies .  Patient reports that she sleeps an average of  10- 12 hours a day, without vivid dreams, REM behavior or sleepwalking   History of sleep apnea?  Patient denies   Any hygiene concerns?  Patient denies   Independent of bathing and dressing?  Endorsed  Does the patient needs help with medications? Son is in charge, puts them in a pill Dance movement psychotherapist. Who is in charge of the finances?  Patient is in charge     Any changes in appetite?   Denies. Decreased water intake, but likes coffee, but I bought the large 64 ounces tumbler with a straw and that helps to drink more water  Patient have trouble swallowing? Patient denies   Does the patient cook?  Patient denies   Any kitchen accidents such as leaving the stove on? Patient denies   Any headaches?  Patient denies   Double vision? Patient denies   Any focal numbness or tingling?  She has chronic leg paresthesia due to a neck injury in 1971.  No new  symptoms of stroke. Chronic back pain Patient denies   Unilateral weakness?  R side is weaker after neck injury 1971.  Any tremors?  Patient denies   Any history of anosmia?  Patient denies   Any incontinence of urine?  History of urge incontinence, OAB controlled with Myrbetriq  Any bowel dysfunction?   Patient denies   History of heavy alcohol intake?  Patient denies   History of heavy tobacco use?  Patient denies   Family history of dementia?  Patient denies  PREVIOUS MEDICATIONS:   CURRENT MEDICATIONS:  Outpatient Encounter Medications as of 10/13/2023  Medication Sig   acetaminophen (TYLENOL) 325 MG tablet Take 650 mg by mouth every 6 (six) hours as needed.  amLODipine  (NORVASC ) 5 MG tablet TAKE 1 TABLET (5 MG TOTAL) BY MOUTH DAILY.   aspirin  EC 81 MG tablet Take 1 tablet (81 mg total) by mouth daily. Swallow whole.   Calcium Carbonate-Vit D-Min (CALTRATE 600+D PLUS MINERALS) 600-800 MG-UNIT CHEW Chew by mouth.   Cyanocobalamin  (VITAMIN B 12 PO) Take 250 mg by mouth daily.   donepezil  (ARICEPT ) 10 MG tablet TAKE 1 TABLET BY MOUTH EVERYDAY AT BEDTIME   MAGNESIUM GLUCONATE PO Take 1 tablet by mouth daily.   memantine  (NAMENDA ) 10 MG tablet Take 1 tablet (10 mg total) by mouth 2 (two) times daily.   Menaquinone-7 (K2 PO) Take 1 tablet by mouth daily.   mirabegron  ER (MYRBETRIQ ) 50 MG TB24 tablet Take 1 tablet (50 mg total) by mouth daily.   Multiple Vitamins-Minerals (CENTRUM SILVER ADULT 50+ PO) Take 1 tablet by mouth daily.   polyethylene glycol (MIRALAX / GLYCOLAX) packet Take 17 g by mouth as needed.   potassium chloride  (KLOR-CON  M) 20 MEQ tablet Take 1 tablet (20 mEq total) by mouth 2 (two) times daily for 3 days.   Probiotic Product (PROBIOTIC PO) Take 1 capsule by mouth every other day.   simvastatin  (ZOCOR ) 40 MG tablet TAKE 1 TABLET BY MOUTH EVERYDAY AT BEDTIME   No facility-administered encounter medications on file as of 10/13/2023.       11/25/2022    7:00 AM 05/26/2022     5:00 PM 12/20/2017    8:56 AM  MMSE - Mini Mental State Exam  Orientation to time 5 1 5   Orientation to Place 4 4 5   Registration 3 3 3   Attention/ Calculation 5 4 0  Recall 1 0 3  Language- name 2 objects 2 2 0  Language- repeat 1 1 1   Language- follow 3 step command 3 3 3   Language- read & follow direction 1 1 0  Write a sentence 1 1 0  Copy design 1 1 0  Total score 27 21 20       11/23/2021    9:00 AM  Montreal Cognitive Assessment   Visuospatial/ Executive (0/5) 1  Naming (0/3) 2  Attention: Read list of digits (0/2) 2  Attention: Read list of letters (0/1) 1  Attention: Serial 7 subtraction starting at 100 (0/3) 1  Language: Repeat phrase (0/2) 1  Language : Fluency (0/1) 1  Abstraction (0/2) 2  Delayed Recall (0/5) 0  Orientation (0/6) 3  Total 14  Adjusted Score (based on education) 14    Objective:     PHYSICAL EXAMINATION:    VITALS:   Vitals:   10/13/23 1525  Resp: 20  Weight: 99 lb (44.9 kg)    GEN:  The patient appears stated age and is in NAD. HEENT:  Normocephalic, atraumatic.   Neurological examination:  General: NAD, well-groomed, appears stated age. Orientation: The patient is alert. Oriented to person, place and date Cranial nerves: There is good facial symmetry.The speech is fluent and clear. No aphasia or dysarthria. Fund of knowledge is reduced. Recent and remote memory are impaired. Attention and concentration are reduced. Able to name objects and repeat phrases.  Hearing is very decreased to conversational tone despite using the hearing aids  Sensation: Sensation is intact to light touch throughout Motor: Right side is weaker after neck injury.  She uses a right cane. DTR's 2/4 in UE/LE     Movement examination: Tone: There is normal tone in the UE/LE Abnormal movements:  no tremor.  No myoclonus.  No  asterixis.   Coordination:  There is no decremation with RAM's. Normal finger to nose  Gait and Station: She walks with a walker.   the patient has difficulty arising out of a deep-seated chair without the use of the hands. The patient's stride length is shorter.  Gait is cautious and narrow.    Thank you for allowing us  the opportunity to participate in the care of this nice patient. Please do not hesitate to contact us  for any questions or concerns.   Total time spent on today's visit was 23 minutes dedicated to this patient today, preparing to see patient, examining the patient, ordering tests and/or medications and counseling the patient, documenting clinical information in the EHR or other health record, independently interpreting results and communicating results to the patient/family, discussing treatment and goals, answering patient's questions and coordinating care.  Cc:  Arnetha Bhat, NP  Tex Filbert 10/13/2023 3:40 PM

## 2023-10-17 ENCOUNTER — Encounter: Payer: Self-pay | Admitting: Physician Assistant

## 2023-11-15 ENCOUNTER — Other Ambulatory Visit: Payer: Self-pay | Admitting: Nurse Practitioner

## 2023-11-15 NOTE — Telephone Encounter (Signed)
 Patient has request refill on medication Myrbetriq . Patient last refill dated 04/26/2023. Patient was given 30 tablets and 5 refills. Patient has no upcoming appointment and had now show 10/06/2023. Patient medication pend and sent to PCP Gil Greig BRAVO, NP for approval.

## 2023-11-24 ENCOUNTER — Ambulatory Visit: Payer: Medicare PPO | Admitting: Physician Assistant

## 2023-12-08 ENCOUNTER — Encounter: Payer: Self-pay | Admitting: Orthopedic Surgery

## 2023-12-08 ENCOUNTER — Ambulatory Visit: Admitting: Orthopedic Surgery

## 2023-12-08 VITALS — BP 130/60 | HR 84 | Temp 97.1°F | Resp 16 | Ht 66.0 in | Wt 107.6 lb

## 2023-12-08 DIAGNOSIS — F0280 Dementia in other diseases classified elsewhere without behavioral disturbance: Secondary | ICD-10-CM | POA: Diagnosis not present

## 2023-12-08 DIAGNOSIS — G309 Alzheimer's disease, unspecified: Secondary | ICD-10-CM

## 2023-12-08 DIAGNOSIS — N1832 Chronic kidney disease, stage 3b: Secondary | ICD-10-CM | POA: Diagnosis not present

## 2023-12-08 DIAGNOSIS — N3281 Overactive bladder: Secondary | ICD-10-CM | POA: Diagnosis not present

## 2023-12-08 DIAGNOSIS — F0150 Vascular dementia without behavioral disturbance: Secondary | ICD-10-CM | POA: Diagnosis not present

## 2023-12-08 MED ORDER — MEMANTINE HCL 10 MG PO TABS: 10.0000 mg | ORAL_TABLET | Freq: Two times a day (BID) | ORAL | 1 refills | Status: AC

## 2023-12-08 MED ORDER — DONEPEZIL HCL 10 MG PO TABS: 10.0000 mg | ORAL_TABLET | Freq: Every day | ORAL | 3 refills | Status: AC
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
## 2023-12-08 NOTE — Patient Instructions (Signed)
 Recommend flu vaccine before November 1st  Consider getting covid booster as well  Schedule medicare annual wellness with us > short quick visit that discusses preventative health> can also be video visit too!

## 2023-12-09 ENCOUNTER — Ambulatory Visit: Payer: Self-pay | Admitting: Orthopedic Surgery

## 2023-12-09 DIAGNOSIS — R8290 Unspecified abnormal findings in urine: Secondary | ICD-10-CM

## 2023-12-09 LAB — CBC WITH DIFFERENTIAL/PLATELET
Absolute Lymphocytes: 1108 {cells}/uL (ref 850–3900)
Absolute Monocytes: 598 {cells}/uL (ref 200–950)
Basophils Absolute: 61 {cells}/uL (ref 0–200)
Basophils Relative: 0.9 %
Eosinophils Absolute: 109 {cells}/uL (ref 15–500)
Eosinophils Relative: 1.6 %
HCT: 38.9 % (ref 35.0–45.0)
Hemoglobin: 12.5 g/dL (ref 11.7–15.5)
MCH: 31.7 pg (ref 27.0–33.0)
MCHC: 32.1 g/dL (ref 32.0–36.0)
MCV: 98.7 fL (ref 80.0–100.0)
MPV: 11.4 fL (ref 7.5–12.5)
Monocytes Relative: 8.8 %
Neutro Abs: 4923 {cells}/uL (ref 1500–7800)
Neutrophils Relative %: 72.4 %
Platelets: 244 Thousand/uL (ref 140–400)
RBC: 3.94 Million/uL (ref 3.80–5.10)
RDW: 12.8 % (ref 11.0–15.0)
Total Lymphocyte: 16.3 %
WBC: 6.8 Thousand/uL (ref 3.8–10.8)

## 2023-12-09 LAB — COMPREHENSIVE METABOLIC PANEL WITH GFR
AG Ratio: 1.3 (calc) (ref 1.0–2.5)
ALT: 24 U/L (ref 6–29)
AST: 37 U/L — ABNORMAL HIGH (ref 10–35)
Albumin: 3.9 g/dL (ref 3.6–5.1)
Alkaline phosphatase (APISO): 47 U/L (ref 37–153)
BUN/Creatinine Ratio: 29 (calc) — ABNORMAL HIGH (ref 6–22)
BUN: 40 mg/dL — ABNORMAL HIGH (ref 7–25)
CO2: 28 mmol/L (ref 20–32)
Calcium: 9.7 mg/dL (ref 8.6–10.4)
Chloride: 102 mmol/L (ref 98–110)
Creat: 1.4 mg/dL — ABNORMAL HIGH (ref 0.60–0.95)
Globulin: 2.9 g/dL (ref 1.9–3.7)
Glucose, Bld: 170 mg/dL — ABNORMAL HIGH (ref 65–139)
Potassium: 4.1 mmol/L (ref 3.5–5.3)
Sodium: 139 mmol/L (ref 135–146)
Total Bilirubin: 0.4 mg/dL (ref 0.2–1.2)
Total Protein: 6.8 g/dL (ref 6.1–8.1)
eGFR: 36 mL/min/1.73m2 — ABNORMAL LOW (ref >=60)

## 2023-12-29 ENCOUNTER — Other Ambulatory Visit: Payer: Self-pay | Admitting: Orthopedic Surgery

## 2023-12-29 DIAGNOSIS — I10 Essential (primary) hypertension: Secondary | ICD-10-CM

## 2024-01-12 ENCOUNTER — Ambulatory Visit (INDEPENDENT_AMBULATORY_CARE_PROVIDER_SITE_OTHER): Admitting: Orthopedic Surgery

## 2024-01-12 ENCOUNTER — Encounter: Payer: Self-pay | Admitting: Orthopedic Surgery

## 2024-01-12 VITALS — BP 116/50 | HR 82 | Temp 97.8°F | Resp 16 | Ht 66.0 in | Wt 100.0 lb

## 2024-01-12 DIAGNOSIS — F0280 Dementia in other diseases classified elsewhere without behavioral disturbance: Secondary | ICD-10-CM | POA: Diagnosis not present

## 2024-01-12 DIAGNOSIS — E440 Moderate protein-calorie malnutrition: Secondary | ICD-10-CM | POA: Diagnosis not present

## 2024-01-12 DIAGNOSIS — Z0000 Encounter for general adult medical examination without abnormal findings: Secondary | ICD-10-CM

## 2024-01-12 DIAGNOSIS — F0150 Vascular dementia without behavioral disturbance: Secondary | ICD-10-CM

## 2024-01-12 DIAGNOSIS — G309 Alzheimer's disease, unspecified: Secondary | ICD-10-CM

## 2024-01-12 NOTE — Patient Instructions (Signed)
  Cindy Davies , Thank you for taking time to come for your Medicare Wellness Visit. I appreciate your ongoing commitment to your health goals. Please review the following plan we discussed and let me know if I can assist you in the future.   These are the goals we discussed:  Goals      Increase physical activity     Starting 12/20/2017, I will continue to bowl for 2.5-3 hours weekly.      Patient Stated     Would like to drink more water        This is a list of the screening recommended for you and due dates:  Health Maintenance  Topic Date Due   Medicare Annual Wellness Visit  01/11/2025   Pneumococcal Vaccine for age over 71  Completed   Flu Shot  Completed   DEXA scan (bone density measurement)  Completed   Zoster (Shingles) Vaccine  Completed   HPV Vaccine  Aged Out   Meningitis B Vaccine  Aged Out   DTaP/Tdap/Td vaccine  Discontinued   Breast Cancer Screening  Discontinued   COVID-19 Vaccine  Discontinued   [facility] referral made for hospice or dementia services.
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
## 2024-02-28 ENCOUNTER — Other Ambulatory Visit: Payer: Self-pay | Admitting: Orthopedic Surgery

## 2024-03-28 ENCOUNTER — Other Ambulatory Visit: Payer: Self-pay | Admitting: Orthopedic Surgery

## 2024-04-12 ENCOUNTER — Other Ambulatory Visit: Payer: Self-pay

## 2024-04-12 ENCOUNTER — Telehealth: Payer: Self-pay

## 2024-04-12 DIAGNOSIS — N1832 Chronic kidney disease, stage 3b: Secondary | ICD-10-CM

## 2024-04-12 NOTE — Telephone Encounter (Signed)
 Spoke with patient son [name] and scheduled lab appointment.

## 2024-04-12 NOTE — Progress Notes (Incomplete)
 Cindy Davies is a very pleasant 89 y.o. 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 Patient is currently on donepezil  10 mg daily and memantine  10 mg twice daily managed by PCP.  This patient is accompanied in the office by her son*** who supplements the history.  Previous records as well as any outside records available were reviewed prior to todays visit. Patient was last seen on 10/13/2023. Memory decline is noted**.  She needs assistance with ADLs and closer monitoring for safety.  Discussed with her son the role of adult day programs once again versus assisted living or memory care for social, cognitive stimulation as well as for 24/7 supervision.*** Discussed the use of AI scribe software for clinical note transcription with the patient, who gave verbal consent to proceed.  Follow-up in 6 months Continue donepezil  10 mg daily consider increasing donepezil  to 23 mg daily, side effects discussed  recommend good control of cardiovascular risk factors Balance Continue the use of hearing aids to improve comprehension, follow-up with audiology Continue to control mood as per PCP History of Present Illness     Any changes in memory since last visit?  Is worse, especially comprehension  son says.  She has more difficulty with short-term memory although long-term memory is affected as well.  She still likes doing word search, watching TV. Repeats oneself?  Endorsed, she obviously -son says Disoriented when walking into a room? Denies    Leaving objects?  May misplace things but not in unusual places   Wandering behavior?  denies   Any personality changes since last visit? She is more aggravated when told what to do, like telling  her to put her socks and I have to end up doing it, not sure if she is doing it purposely or not.   Any worsening depression?:  Denies.   Hallucinations or paranoia?  There were a couple of episodes in which she talks to herself but has not been agitated.. Seizures? denies    Any sleep changes?  Sleeps too much -son says.  Denies vivid dreams, REM behavior or sleepwalking   Sleep apnea?   Denies.   Any hygiene concerns? Denies.  Independent of bathing and dressing?  Endorsed  Does the patient needs help with medications?  Son is in charge   Who is in charge of the finances?  Son is in charge     Any changes in appetite?  Decreased, does not drink enough water.    Patient have trouble swallowing? Denies.   Does the patient cook? No  Any headaches?   denies   Any vision changes? no Chronic back pain  denies   Ambulates with difficulty?  Since 1971, the patient has difficulty walking after an accident, uses a walker for stability.  Recent falls or head injuries? Denies.     Unilateral weakness, numbness or tingling?  She has chronic right lower extremity paresthesias since 1971 Any tremors?  Denies    Any anosmia?  Denies   Any incontinence of urine?  Endorsed, despite using Myrbetriq , mostly overnight.   Any bowel dysfunction?   Denies      Patient lives with her son Cindy Davies  Does the patient drive? No longer drives     MRI brain December 2022 was  reviewed, showing mild generalized age-related volume loss, without evidence of advanced atrophy or lobar predominance.  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 For about 3 years, worse since 2024/04/09 after her husband death, STM < LTM.  Sometimes she forgets names and dates.  She is severely hard of hearing, so several times, she will make  her own answer because she cannot hear well .  Her son reports that she was never very bright .  She does enjoy doing crossword puzzles. Patient lives with: Her son repeats oneself? Denies  Disoriented when walking into a room?  Patient denies   Leaving objects in unusual places?  Denies  Ambulates  with difficulty?  Not as mobile as before, she is weaker. No balance issues.  She uses a  R cane to ambulate. Recent falls?  Patient denies   Any head injuries?  Patient denies   History of seizures?   Patient denies   Wandering behavior?  Patient denies   Patient drives?   Patient no longer drives   Any mood changes ? Residual depression from her husband dying last Apr 09, 2024.  She did have grief counseling.  She is also on Paxil , her son is entertaining changing days, as he is concerned that it may be affecting her memory.  He is to discuss with PCP-he just changed PCPs to a geriatric one. Hallucinations?  Patient denies   Paranoia? Denies .  Patient reports that she sleeps an average of  10- 12 hours a day, without vivid dreams, REM behavior or sleepwalking   History of sleep apnea?  Patient denies   Any hygiene concerns?  Patient denies   Independent of bathing and dressing?  Endorsed  Does the patient needs help with medications? Son is in charge, puts them in a pill dance movement psychotherapist. Who is in charge of the finances?  Patient is in charge     Any changes in appetite?   Denies. Decreased water intake, but likes coffee, but I bought the large 64 ounces tumbler with a straw and that helps to drink more water  Patient have trouble swallowing? Patient denies   Does the patient cook?  Patient denies   Any kitchen accidents such as leaving the stove on? Patient denies   Any headaches?  Patient denies   Double vision? Patient denies   Any focal numbness or tingling?  She has chronic leg paresthesia due to a neck injury in 1971.  No new symptoms of stroke. Chronic back pain Patient denies    Unilateral weakness?  R side is weaker after neck injury 1971.  Any tremors?  Patient denies   Any history of anosmia?  Patient denies   Any incontinence of urine?  History of urge incontinence, OAB controlled with Myrbetriq  Any bowel dysfunction?   Patient denies   History of heavy alcohol intake?  Patient denies   History of heavy tobacco use?  Patient denies   Family history of dementia?  Patient denies           01/12/2024    2:38 PM 11/25/2022    7:00 AM 05/26/2022    5:00 PM  MMSE - Mini Mental State Exam  Not completed: Unable to complete    Orientation to time  5 1  Orientation to Place  4 4  Registration  3 3  Attention/ Calculation  5 4  Recall  1 0  Language- name 2 objects  2 2  Language- repeat  1 1  Language- follow 3 step command  3 3  Language- read & follow direction  1 1  Write a sentence  1 1  Copy design  1 1  Total score  27 21      11/23/2021    9:00 AM  Montreal Cognitive Assessment   Visuospatial/ Executive (0/5) 1  Naming (0/3) 2  Attention: Read list of digits (0/2) 2  Attention: Read list of letters (0/1) 1  Attention: Serial 7 subtraction starting at 100 (0/3) 1  Language: Repeat phrase (0/2) 1  Language : Fluency (0/1) 1  Abstraction (0/2) 2  Delayed Recall (0/5) 0  Orientation (0/6) 3  Total 14  Adjusted Score (based on education) 14      Objective:    Neurological Exam:    VITALS:  There were no vitals filed for this visit.  GEN:  The patient appears stated age and is in NAD. HEENT:  Normocephalic, atraumatic.   Neurological examination:  General: NAD, well-groomed, appears stated age. Orientation: The patient is alert. Oriented to person, not to place and date Cranial nerves: There is good facial symmetry.The speech is fluent and clear. No aphasia or dysarthria. Fund of knowledge is appropriate. Recent and remote memory are impaired. Attention and concentration are reduced. Able to name objects and unable to repeat phrases.   Hearing is very decreased to conversational tone despite using hearing aids. *** Sensation: Sensation is intact to light touch throughout Motor: Right side is weaker after neck injury in 1971.  She uses a walker*** DTR's 2/4 in UE/LE     Movement examination:  Tone: There is normal tone in the UE/decreased tone in the lower extremities.  Abnormal movements:  no tremor.  No myoclonus.  No asterixis.   Coordination:  There is no decremation with RAM's. Normal finger to nose  Gait and Station: The patient has no*** difficulty arising out of a deep-seated chair without the use of the hands. The patient's stride length is short and.  Gait is cautious and narrow.    Thank you for allowing us  the opportunity to participate in the care of this nice patient. Please do not hesitate to contact us  for any questions or concerns.   Total time spent on today's visit was *** minutes dedicated to this patient today, preparing to see patient, examining the patient, ordering tests and/or medications and counseling the patient, documenting clinical information in the EHR or other health record, independently interpreting results and communicating results to the patient/family, discussing treatment and goals, answering patient's questions and coordinating care.  Cc:  Gil Greig BRAVO, NP  Camie Sevin 04/12/2024 6:33 AM

## 2024-04-12 NOTE — Telephone Encounter (Signed)
 Copied from CRM [id number]. Topic: General - Other >> Apr 12, 2024 12:55 PM Chiquita SQUIBB wrote: Reason for CRM: Patients son [name] is calling in because he noticed her mychart had a upcoming lab work that needed to be done by December 25, he is not sure when this was order and if his mother needed to complete this. Please advise [name].

## 2024-04-13 ENCOUNTER — Other Ambulatory Visit

## 2024-04-13 ENCOUNTER — Ambulatory Visit: Admitting: Physician Assistant

## 2024-04-14 LAB — BASIC METABOLIC PANEL WITHOUT GFR
BUN/Creatinine Ratio: 50 (calc) — ABNORMAL HIGH (ref 6–22)
BUN: 39 mg/dL — ABNORMAL HIGH (ref 7–25)
CO2: 34 mmol/L — ABNORMAL HIGH (ref 20–32)
Calcium: 9.3 mg/dL (ref 8.6–10.4)
Chloride: 101 mmol/L (ref 98–110)
Creat: 0.78 mg/dL (ref 0.60–0.95)
Glucose, Bld: 93 mg/dL (ref 65–139)
Potassium: 3.3 mmol/L — ABNORMAL LOW (ref 3.5–5.3)
Sodium: 143 mmol/L (ref 135–146)

## 2024-04-15 ENCOUNTER — Other Ambulatory Visit: Payer: Self-pay | Admitting: Orthopedic Surgery

## 2024-04-15 DIAGNOSIS — E876 Hypokalemia: Secondary | ICD-10-CM

## 2024-04-15 MED ORDER — POTASSIUM CHLORIDE CRYS ER 20 MEQ PO TBCR: 20.0000 meq | EXTENDED_RELEASE_TABLET | Freq: Two times a day (BID) | ORAL | 0 refills | Status: AC

## 2024-04-15 NOTE — Progress Notes (Signed)
 "     Cindy Davies is a very pleasant 89 y.o. 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 Patient is currently on donepezil  10 mg daily and memantine  10 mg twice daily managed by PCP.  This patient is accompanied in the office by her son*** who supplements the history.  Previous records as well as any outside records available were reviewed prior to todays visit. Patient was last seen on 10/13/2023. Memory decline is noted**.  She needs assistance with ADLs and closer monitoring for safety.  Discussed with her son the role of adult day programs once again versus assisted living or memory care for social, cognitive stimulation as well as for 24/7 supervision.*** Discussed the use of AI scribe software for clinical note transcription with the patient, who gave verbal consent to proceed.  Follow-up in 6 months Continue donepezil  10 mg daily consider increasing donepezil  to 23 mg daily, side effects discussed  recommend good control of cardiovascular risk factors Balance Continue the use of hearing aids to improve comprehension, follow-up with audiology Continue to control mood as per PCP History of Present Illness     Any changes in memory since last visit?  Is worse, especially comprehension  son says.  She has more difficulty with short-term memory although long-term memory is affected as well.  She still likes doing word search, watching TV. Repeats oneself?  Endorsed, she obviously -son says Disoriented when walking into a room? Denies    Leaving objects?  May misplace things but not in unusual places   Wandering behavior?  denies   Any personality changes since last visit? She is more aggravated when told what to do, like telling  her to put her socks and I have to end up doing it, not sure if she is doing it purposely or not.   Any worsening depression?:  Denies.   Hallucinations or paranoia?  There were a couple of episodes in which she talks to herself but has not been agitated.. Seizures? denies    Any sleep changes?  Sleeps too much -son says.  Denies vivid dreams, REM behavior or sleepwalking   Sleep apnea?   Denies.   Any hygiene concerns? Denies.  Independent of bathing and dressing?  Endorsed  Does the patient needs help with medications?  Son is in charge   Who is in charge of the finances?  Son is in charge     Any changes in appetite?  Decreased, does not drink enough water.    Patient have trouble swallowing? Denies.   Does the patient cook? No  Any headaches?   denies   Any vision changes? no Chronic back pain  denies   Ambulates with difficulty?  Since 1971, the patient has difficulty walking after an accident, uses a walker for stability.  Recent falls or head injuries? Denies.     Unilateral weakness, numbness or tingling?  She has chronic right lower extremity paresthesias since 1971 Any tremors?  Denies    Any anosmia?  Denies   Any incontinence of urine?  Endorsed, despite using Myrbetriq , mostly overnight.   Any bowel dysfunction?   Denies      Patient lives with her son Cindy Davies  Does the patient drive? No longer drives     MRI brain December  2022 was reviewed, showing mild generalized age-related volume loss, without evidence of advanced atrophy or lobar predominance.  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 For about 3 years, worse since 2024/04/15 after her husband death, STM < LTM.  Sometimes she forgets names and dates.  She is severely hard of hearing, so several times, she will make  her own answer because she cannot hear well .  Her son reports that she was never very bright .  She does enjoy doing crossword puzzles. Patient lives with: Her son repeats oneself? Denies  Disoriented when walking into a room?  Patient denies   Leaving objects in unusual places?  Denies  Ambulates  with difficulty?  Not as mobile as before, she is weaker. No balance issues.  She uses a  R cane to ambulate. Recent falls?  Patient denies   Any head injuries?  Patient denies   History of seizures?   Patient denies   Wandering behavior?  Patient denies   Patient drives?   Patient no longer drives   Any mood changes ? Residual depression from her husband dying last 04-15-24.  She did have grief counseling.  She is also on Paxil , her son is entertaining changing days, as he is concerned that it may be affecting her memory.  He is to discuss with PCP-he just changed PCPs to a geriatric one. Hallucinations?  Patient denies   Paranoia? Denies .  Patient reports that she sleeps an average of  10- 12 hours a day, without vivid dreams, REM behavior or sleepwalking   History of sleep apnea?  Patient denies   Any hygiene concerns?  Patient denies   Independent of bathing and dressing?  Endorsed  Does the patient needs help with medications? Son is in charge, puts them in a pill dance movement psychotherapist. Who is in charge of the finances?  Patient is in charge     Any changes in appetite?   Denies. Decreased water intake, but likes coffee, but I bought the large 64 ounces tumbler with a straw and that helps to drink more water  Patient have trouble swallowing? Patient denies   Does the patient cook?  Patient denies   Any kitchen accidents such as leaving the stove on? Patient denies   Any headaches?  Patient denies   Double vision? Patient denies   Any focal numbness or tingling?  She has chronic leg paresthesia due to a neck injury in 1971.  No new symptoms of stroke. Chronic back pain Patient denies    Unilateral weakness?  R side is weaker after neck injury 1971.  Any tremors?  Patient denies   Any history of anosmia?  Patient denies   Any incontinence of urine?  History of urge incontinence, OAB controlled with Myrbetriq  Any bowel dysfunction?   Patient denies   History of heavy alcohol intake?  Patient denies   History of heavy tobacco use?  Patient denies   Family history of dementia?  Patient denies           01/12/2024    2:38 PM 11/25/2022    7:00 AM 05/26/2022    5:00 PM  MMSE - Mini Mental State Exam  Not completed: Unable to complete    Orientation to time  5 1  Orientation to Place  4  4  Registration  3 3  Attention/ Calculation  5 4  Recall  1 0  Language- name 2 objects  2 2  Language- repeat  1 1  Language- follow 3 step command  3 3  Language- read & follow direction  1 1  Write a sentence  1 1  Copy design  1 1  Total score  27 21      11/23/2021    9:00 AM  Montreal Cognitive Assessment   Visuospatial/ Executive (0/5) 1  Naming (0/3) 2  Attention: Read list of digits (0/2) 2  Attention: Read list of letters (0/1) 1  Attention: Serial 7 subtraction starting at 100 (0/3) 1  Language: Repeat phrase (0/2) 1  Language : Fluency (0/1) 1  Abstraction (0/2) 2  Delayed Recall (0/5) 0  Orientation (0/6) 3  Total 14  Adjusted Score (based on education) 14      Objective:    Neurological Exam:    VITALS:  There were no vitals filed for this visit.  GEN:  The patient appears stated age and is in NAD. HEENT:  Normocephalic, atraumatic.   Neurological examination:  General: NAD, well-groomed, appears stated age. Orientation: The patient is alert. Oriented to person, not to place and date Cranial nerves: There is good facial symmetry.The speech is fluent and clear. No aphasia or dysarthria. Fund of knowledge is appropriate. Recent and remote memory are impaired. Attention and concentration are reduced. Able to name objects and unable to repeat phrases.   Hearing is very decreased to conversational tone despite using hearing aids. *** Sensation: Sensation is intact to light touch throughout Motor: Right side is weaker after neck injury in 1971.  She uses a walker*** DTR's 2/4 in UE/LE     Movement examination:  Tone: There is normal tone in the UE/decreased tone in the lower extremities.  Abnormal movements:  no tremor.  No myoclonus.  No asterixis.   Coordination:  There is no decremation with RAM's. Normal finger to nose  Gait and Station: The patient has no*** difficulty arising out of a deep-seated chair without the use of the hands. The patient's stride length is short and.  Gait is cautious and narrow.    Thank you for allowing us  the opportunity to participate in the care of this nice patient. Please do not hesitate to contact us  for any questions or concerns.   Total time spent on today's visit was *** minutes dedicated to this patient today, preparing to see patient, examining the patient, ordering tests and/or medications and counseling the patient, documenting clinical information in the EHR or other health record, independently interpreting results and communicating results to the patient/family, discussing treatment and goals, answering patient's questions and coordinating care.  Cc:  Cindy Greig BRAVO, NP  Cindy Davies 04/15/2024 2:18 PM      "

## 2024-04-16 ENCOUNTER — Other Ambulatory Visit: Payer: Self-pay | Admitting: Physician Assistant

## 2024-04-16 ENCOUNTER — Ambulatory Visit: Admitting: Physician Assistant

## 2024-04-16 ENCOUNTER — Encounter: Payer: Self-pay | Admitting: Physician Assistant

## 2024-04-16 VITALS — BP 156/69 | HR 87 | Resp 20 | Wt 113.0 lb

## 2024-04-16 DIAGNOSIS — F0280 Dementia in other diseases classified elsewhere without behavioral disturbance: Secondary | ICD-10-CM

## 2024-04-16 DIAGNOSIS — G309 Alzheimer's disease, unspecified: Secondary | ICD-10-CM

## 2024-04-16 DIAGNOSIS — F0150 Vascular dementia without behavioral disturbance: Secondary | ICD-10-CM | POA: Diagnosis not present

## 2024-04-16 MED ORDER — QUETIAPINE FUMARATE 25 MG PO TABS: ORAL_TABLET | ORAL | 11 refills | Status: DC

## 2024-04-16 NOTE — Patient Instructions (Signed)
 It was a pleasure to see you today at our office.   Recommendations:  Start Seroquel  12.5 mg at night, may increase it  to 5 mg daily  Continue donepezil  10 mg daily as per PCP  Continue memantine  10 mg twice a day   Continue B12 supplements  Folllow up  in 6 months         RECOMMENDATIONS FOR ALL PATIENTS WITH MEMORY PROBLEMS: 1. Continue to exercise (Recommend 30 minutes of walking everyday, or 3 hours every week) 2. Increase social interactions - continue going to [place] and enjoy social gatherings with friends and family 3. Eat healthy, avoid fried foods and eat more fruits and vegetables 4. Maintain adequate blood pressure, blood sugar, and blood cholesterol level. Reducing the risk of stroke and cardiovascular disease also helps promoting better memory. 5. Avoid stressful situations. Live a simple life and avoid aggravations. Organize your time and prepare for the next day in anticipation. 6. Sleep well, avoid any interruptions of sleep and avoid any distractions in the bedroom that may interfere with adequate sleep quality 7. Avoid sugar, avoid sweets as there is a strong link between excessive sugar intake, diabetes, and cognitive impairment We discussed the Mediterranean diet, which has been shown to help patients reduce the risk of progressive memory disorders and reduces cardiovascular risk. This includes eating fish, eat fruits and green leafy vegetables, nuts like almonds and hazelnuts, walnuts, and also use olive oil. Avoid fast foods and fried foods as much as possible. Avoid sweets and sugar as sugar use has been linked to worsening of memory function.  There is always a concern of gradual progression of memory problems. If this is the case, then we may need to adjust level of care according to patient needs. Support, both to the patient and caregiver, should then be put into place.        FALL PRECAUTIONS: Be cautious when walking. Scan the area for obstacles that may  increase the risk of trips and falls. When getting up in the mornings, sit up at the edge of the bed for a few minutes before getting out of bed. Consider elevating the bed at the head end to avoid drop of blood pressure when getting up. Walk always in a well-lit room (use night lights in the walls). Avoid area rugs or power cords from appliances in the middle of the walkways. Use a walker or a cane if necessary and consider physical therapy for balance exercise. Get your eyesight checked regularly.  FINANCIAL OVERSIGHT: Supervision, especially oversight when making financial decisions or transactions is also recommended.  HOME SAFETY: Consider the safety of the kitchen when operating appliances like stoves, microwave oven, and blender. Consider having supervision and share cooking responsibilities until no longer able to participate in those. Accidents with firearms and other hazards in the house should be identified and addressed as well.   ABILITY TO BE LEFT ALONE: If patient is unable to contact 911 operator, consider using LifeLine, or when the need is there, arrange for someone to stay with patients. Smoking is a fire hazard, consider supervision or cessation. Risk of wandering should be assessed by caregiver and if detected at any point, supervision and safe proof recommendations should be instituted.  MEDICATION SUPERVISION: Inability to self-administer medication needs to be constantly addressed. Implement a mechanism to ensure safe administration of the medications.   DRIVING: Regarding driving, in patients with progressive memory problems, driving will be impaired. We advise to have someone else do  the driving if trouble finding directions or if minor accidents are reported. Independent driving assessment is available to determine safety of driving.   If you are interested in the driving assessment, you can contact the following:  [facility] [employer] [facility] in [place]  [phone]  [facility] [phone]  [facility] [phone] or [phone]    Mediterranean Diet A Mediterranean diet refers to food and lifestyle choices that are based on the traditions of countries located on the [employer]. This way of eating has been shown to help prevent certain conditions and improve outcomes for people who have chronic diseases, like kidney disease and heart disease. What are tips for following this plan? Lifestyle  Cook and eat meals together with your family, when possible. Drink enough fluid to keep your urine clear or pale yellow. Be physically active every day. This includes: Aerobic exercise like running or swimming. Leisure activities like gardening, walking, or housework. Get 7-8 hours of sleep each night. If recommended by your health care provider, drink red wine in moderation. This means 1 glass a day for nonpregnant women and 2 glasses a day for men. A glass of wine equals 5 oz (150 mL). Reading food labels  Check the serving size of packaged foods. For foods such as rice and pasta, the serving size refers to the amount of cooked product, not dry. Check the total fat in packaged foods. Avoid foods that have saturated fat or trans fats. Check the ingredients list for added sugars, such as corn syrup. Shopping  At the grocery store, buy most of your food from the areas near the walls of the store. This includes: Fresh fruits and vegetables (produce). Grains, beans, nuts, and seeds. Some of these may be available in unpackaged forms or large amounts (in bulk). Fresh seafood. Poultry and eggs. Low-fat dairy products. Buy whole ingredients instead of prepackaged foods. Buy fresh fruits and vegetables in-season from local farmers markets. Buy frozen fruits and vegetables in resealable bags. If you do not have access to quality fresh seafood, buy precooked frozen shrimp or canned  fish, such as tuna, Sol[place], or sardines. Buy small amounts of raw or cooked vegetables, salads, or olives from the deli or salad bar at your store. Stock your pantry so you always have certain foods on hand, such as olive oil, canned tuna, canned tomatoes, rice, pasta, and beans. Cooking  Cook foods with extra-virgin olive oil instead of using butter or other vegetable oils. Have meat as a side dish, and have vegetables or grains as your main dish. This means having meat in small portions or adding small amounts of meat to foods like pasta or stew. Use beans or vegetables instead of meat in common dishes like chili or lasagna. Experiment with different cooking methods. Try roasting or broiling vegetables instead of steaming or sauteing them. Add frozen vegetables to soups, stews, pasta, or rice. Add nuts or seeds for added healthy fat at each meal. You can add these to yogurt, salads, or vegetable dishes. Marinate fish or vegetables using olive oil, lemon juice, garlic, and fresh herbs. Meal planning  Plan to eat 1 vegetarian meal one day each week. Try to work up to 2 vegetarian meals, if possible. Eat seafood 2 or more times a week. Have healthy snacks readily available, such as: Vegetable sticks with hummus. Greek yogurt. Fruit and nut trail mix. Eat balanced meals throughout the week. This includes: Fruit: 2-3 servings a day Vegetables: 4-5  servings a day Low-fat dairy: 2 servings a day Fish, poultry, or lean meat: 1 serving a day Beans and legumes: 2 or more servings a week Nuts and seeds: 1-2 servings a day Whole grains: 6-8 servings a day Extra-virgin olive oil: 3-4 servings a day Limit red meat and sweets to only a few servings a month What are my food choices? Mediterranean diet Recommended Grains: Whole-grain pasta. Brown rice. Bulgar wheat. Polenta. Couscous. Whole-wheat bread. [place] Madeira. Vegetables: Artichokes. Beets. Broccoli. Cabbage. Carrots. Eggplant. Green  beans. Chard. Kale. Spinach. Onions. Leeks. Peas. Squash. Tomatoes. Peppers. Radishes. Fruits: Apples. Apricots. Avocado. Berries. Bananas. Cherries. Dates. Figs. Grapes. Lemons. Melon. Oranges. Peaches. Plums. Pomegranate. Meats and other protein foods: Beans. Almonds. Sunflower seeds. Pine nuts. Peanuts. Cod. [place]. Scallops. Shrimp. Tuna. Tilapia. Clams. Oysters. Eggs. Dairy: Low-fat milk. Cheese. Greek yogurt. Beverages: Water. Red wine. Herbal tea. Fats and oils: Extra virgin olive oil. Avocado oil. Grape seed oil. Sweets and desserts: Greek yogurt with honey. Baked apples. Poached pears. Trail mix. Seasoning and other foods: Basil. Cilantro. Coriander. Cumin. Mint. Parsley. Sage. Rosemary. Tarragon. Garlic. Oregano. Thyme. Pepper. Balsalmic vinegar. Tahini. Hummus. Tomato sauce. Olives. Mushrooms. Limit these Grains: Prepackaged pasta or rice dishes. Prepackaged cereal with added sugar. Vegetables: Deep fried potatoes (french fries). Fruits: Fruit canned in syrup. Meats and other protein foods: Beef. Pork. Lamb. Poultry with skin. Hot dogs. [place]. Dairy: Ice cream. Sour cream. Whole milk. Beverages: Juice. Sugar-sweetened soft drinks. Beer. Liquor and spirits. Fats and oils: Butter. Canola oil. Vegetable oil. Beef fat (tallow). Lard. Sweets and desserts: Cookies. Cakes. Pies. Candy. Seasoning and other foods: Mayonnaise. Premade sauces and marinades. The items listed may not be a complete list. Talk with your dietitian about what dietary choices are right for you. Summary The Mediterranean diet includes both food and lifestyle choices. Eat a variety of fresh fruits and vegetables, beans, nuts, seeds, and whole grains. Limit the amount of red meat and sweets that you eat. Talk with your health care provider about whether it is safe for you to drink red wine in moderation. This means 1 glass a day for nonpregnant women and 2 glasses a day for men. A glass of wine equals 5 oz (150  mL). This information is not intended to replace advice given to you by your health care provider. Make sure you discuss any questions you have with your health care provider. Document Released: 12/04/2015 Document Revised: 01/06/2016 Document Reviewed: 12/04/2015 Elsevier Interactive Patient Education  2017 [employer].

## 2024-04-16 NOTE — Addendum Note (Signed)
 Addended byBETHA GIL NO E on: 04/16/2024 01:41 PM   Modules accepted: Orders

## 2024-04-17 ENCOUNTER — Other Ambulatory Visit: Payer: Self-pay

## 2024-04-17 DIAGNOSIS — R8290 Unspecified abnormal findings in urine: Secondary | ICD-10-CM

## 2024-04-18 ENCOUNTER — Other Ambulatory Visit

## 2024-04-19 LAB — URINE CULTURE
MICRO NUMBER:: 17397676
SPECIMEN QUALITY:: ADEQUATE

## 2024-04-19 LAB — URINALYSIS
Bilirubin Urine: NEGATIVE
Glucose, UA: NEGATIVE
Hgb urine dipstick: NEGATIVE
Ketones, ur: NEGATIVE
Leukocytes,Ua: NEGATIVE
Nitrite: NEGATIVE
Specific Gravity, Urine: 1.013 (ref 1.001–1.035)
pH: 6.5 (ref 5.0–8.0)

## 2024-04-20 ENCOUNTER — Ambulatory Visit: Payer: Self-pay | Admitting: Orthopedic Surgery

## 2024-04-25 ENCOUNTER — Telehealth: Payer: Self-pay | Admitting: Physician Assistant

## 2024-04-25 ENCOUNTER — Other Ambulatory Visit: Payer: Self-pay | Admitting: Physician Assistant

## 2024-04-25 MED ORDER — QUETIAPINE FUMARATE 25 MG PO TABS: ORAL_TABLET | ORAL | 3 refills | Status: AC

## 2024-04-25 NOTE — Telephone Encounter (Signed)
 Pt's son Raford called this morning and he stated that [facility] would not fill the prescription called : QUEtiapine  (SEROQUEL ) 25 MG tablet due to they told him that they had to check out some other things first with insurance .  Raford would like to have the Generic brand for SEROQUEL , and he would like the prescription to go to [facility] in [place], [place]. Please call. Thanks

## 2024-06-14 ENCOUNTER — Ambulatory Visit: Payer: Self-pay | Admitting: Orthopedic Surgery

## 2024-10-15 ENCOUNTER — Ambulatory Visit: Payer: Self-pay | Admitting: Physician Assistant

## 2025-01-17 ENCOUNTER — Ambulatory Visit: Payer: Self-pay | Admitting: Orthopedic Surgery
